# Patient Record
Sex: Male | Born: 1935 | Race: White | Hispanic: Yes | Marital: Married | Smoking: Never smoker
Health system: Southern US, Community
[De-identification: ages and names within clinical notes are randomized; demographics above are authoritative.]

## PROBLEM LIST (undated history)

## (undated) DIAGNOSIS — L719 Rosacea, unspecified: Secondary | ICD-10-CM

## (undated) DIAGNOSIS — E291 Testicular hypofunction: Secondary | ICD-10-CM

## (undated) DIAGNOSIS — R7303 Prediabetes: Secondary | ICD-10-CM

## (undated) DIAGNOSIS — M1990 Unspecified osteoarthritis, unspecified site: Secondary | ICD-10-CM

## (undated) DIAGNOSIS — I10 Essential (primary) hypertension: Secondary | ICD-10-CM

## (undated) DIAGNOSIS — E785 Hyperlipidemia, unspecified: Secondary | ICD-10-CM

## (undated) DIAGNOSIS — R001 Bradycardia, unspecified: Secondary | ICD-10-CM

## (undated) HISTORY — DX: Rosacea, unspecified: L71.9

## (undated) HISTORY — DX: Unspecified osteoarthritis, unspecified site: M19.90

## (undated) HISTORY — DX: Testicular hypofunction: E29.1

## (undated) HISTORY — DX: Prediabetes: R73.03

## (undated) HISTORY — DX: Essential (primary) hypertension: I10

## (undated) HISTORY — PX: INGUINAL HERNIA REPAIR: SUR1180

## (undated) HISTORY — PX: CATARACT EXTRACTION: SUR2

## (undated) HISTORY — DX: Bradycardia, unspecified: R00.1

## (undated) HISTORY — DX: Hyperlipidemia, unspecified: E78.5

---

## 2007-02-13 ENCOUNTER — Ambulatory Visit: Payer: Self-pay | Admitting: Internal Medicine

## 2007-02-13 DIAGNOSIS — I10 Essential (primary) hypertension: Secondary | ICD-10-CM

## 2007-02-13 DIAGNOSIS — E785 Hyperlipidemia, unspecified: Secondary | ICD-10-CM | POA: Insufficient documentation

## 2007-02-15 LAB — CONVERTED CEMR LAB
Basophils Relative: 1.8 % — ABNORMAL HIGH (ref 0.0–1.0)
Chloride: 107 meq/L (ref 96–112)
Creatinine, Ser: 0.8 mg/dL (ref 0.4–1.5)
GFR calc non Af Amer: 101 mL/min
HCT: 42.1 % (ref 39.0–52.0)
Hemoglobin: 14.5 g/dL (ref 13.0–17.0)
Lymphocytes Relative: 25.1 % (ref 12.0–46.0)
MCV: 90.6 fL (ref 78.0–100.0)
Neutrophils Relative %: 62.5 % (ref 43.0–77.0)
Platelets: 259 10*3/uL (ref 150–400)
RBC: 4.65 M/uL (ref 4.22–5.81)
RDW: 12.5 % (ref 11.5–14.6)

## 2007-03-27 ENCOUNTER — Encounter (INDEPENDENT_AMBULATORY_CARE_PROVIDER_SITE_OTHER): Payer: Self-pay | Admitting: *Deleted

## 2007-05-05 ENCOUNTER — Ambulatory Visit: Payer: Self-pay | Admitting: Internal Medicine

## 2007-05-08 ENCOUNTER — Encounter (INDEPENDENT_AMBULATORY_CARE_PROVIDER_SITE_OTHER): Payer: Self-pay | Admitting: *Deleted

## 2007-05-08 LAB — CONVERTED CEMR LAB
ALT: 28 units/L (ref 0–53)
CO2: 30 meq/L (ref 19–32)
Chloride: 104 meq/L (ref 96–112)
Cholesterol: 140 mg/dL (ref 0–200)
Creatinine, Ser: 1.1 mg/dL (ref 0.4–1.5)
GFR calc Af Amer: 85 mL/min
GFR calc non Af Amer: 70 mL/min
Glucose, Bld: 84 mg/dL (ref 70–99)
HDL: 33.6 mg/dL — ABNORMAL LOW (ref >39.0)
LDL Cholesterol: 67 mg/dL (ref 0–99)
Sodium: 142 meq/L (ref 135–145)
Total CHOL/HDL Ratio: 4.2
VLDL: 39 mg/dL (ref 0–40)

## 2007-05-10 ENCOUNTER — Telehealth (INDEPENDENT_AMBULATORY_CARE_PROVIDER_SITE_OTHER): Payer: Self-pay | Admitting: *Deleted

## 2007-05-19 ENCOUNTER — Ambulatory Visit: Payer: Self-pay | Admitting: Internal Medicine

## 2007-05-22 ENCOUNTER — Ambulatory Visit: Payer: Self-pay | Admitting: Internal Medicine

## 2007-05-24 ENCOUNTER — Ambulatory Visit: Payer: Self-pay | Admitting: Internal Medicine

## 2007-05-25 ENCOUNTER — Encounter: Payer: Self-pay | Admitting: Internal Medicine

## 2007-05-25 ENCOUNTER — Encounter (INDEPENDENT_AMBULATORY_CARE_PROVIDER_SITE_OTHER): Payer: Self-pay | Admitting: *Deleted

## 2007-05-25 LAB — CONVERTED CEMR LAB
LDL Cholesterol: 64 mg/dL (ref 0–99)
PSA: 0.6 ng/mL (ref 0.10–4.00)

## 2007-05-26 ENCOUNTER — Encounter: Payer: Self-pay | Admitting: Internal Medicine

## 2007-05-27 LAB — CONVERTED CEMR LAB

## 2007-06-01 ENCOUNTER — Ambulatory Visit: Payer: Self-pay | Admitting: Family Medicine

## 2007-06-01 ENCOUNTER — Encounter (INDEPENDENT_AMBULATORY_CARE_PROVIDER_SITE_OTHER): Payer: Self-pay | Admitting: *Deleted

## 2007-06-02 ENCOUNTER — Telehealth: Payer: Self-pay | Admitting: Internal Medicine

## 2007-06-07 ENCOUNTER — Telehealth (INDEPENDENT_AMBULATORY_CARE_PROVIDER_SITE_OTHER): Payer: Self-pay | Admitting: *Deleted

## 2007-06-08 ENCOUNTER — Ambulatory Visit: Payer: Self-pay | Admitting: Internal Medicine

## 2007-06-28 ENCOUNTER — Encounter: Admission: RE | Admit: 2007-06-28 | Discharge: 2007-06-28 | Payer: Self-pay | Admitting: Internal Medicine

## 2007-06-28 ENCOUNTER — Ambulatory Visit: Payer: Self-pay | Admitting: Internal Medicine

## 2007-08-02 ENCOUNTER — Telehealth (INDEPENDENT_AMBULATORY_CARE_PROVIDER_SITE_OTHER): Payer: Self-pay | Admitting: *Deleted

## 2007-08-04 ENCOUNTER — Emergency Department (HOSPITAL_COMMUNITY): Admission: EM | Admit: 2007-08-04 | Discharge: 2007-08-04 | Payer: Self-pay | Admitting: Emergency Medicine

## 2007-08-04 ENCOUNTER — Telehealth: Payer: Self-pay | Admitting: Internal Medicine

## 2007-08-04 ENCOUNTER — Ambulatory Visit (HOSPITAL_COMMUNITY): Admission: RE | Admit: 2007-08-04 | Discharge: 2007-08-04 | Payer: Self-pay | Admitting: Emergency Medicine

## 2007-10-05 ENCOUNTER — Ambulatory Visit: Payer: Self-pay | Admitting: Internal Medicine

## 2007-10-05 ENCOUNTER — Telehealth (INDEPENDENT_AMBULATORY_CARE_PROVIDER_SITE_OTHER): Payer: Self-pay | Admitting: *Deleted

## 2007-10-06 ENCOUNTER — Ambulatory Visit: Payer: Self-pay | Admitting: Internal Medicine

## 2007-10-09 ENCOUNTER — Telehealth (INDEPENDENT_AMBULATORY_CARE_PROVIDER_SITE_OTHER): Payer: Self-pay | Admitting: *Deleted

## 2007-10-09 LAB — CONVERTED CEMR LAB
Albumin: 3.9 g/dL (ref 3.5–5.2)
Alkaline Phosphatase: 50 units/L (ref 39–117)
BUN: 23 mg/dL (ref 6–23)
Creatinine, Ser: 1.5 mg/dL (ref 0.4–1.5)
GFR calc Af Amer: 59 mL/min
Potassium: 3.1 meq/L — ABNORMAL LOW (ref 3.5–5.1)
Sodium: 141 meq/L (ref 135–145)
Total Bilirubin: 0.8 mg/dL (ref 0.3–1.2)
Total Protein: 7.5 g/dL (ref 6.0–8.3)

## 2007-10-11 ENCOUNTER — Telehealth (INDEPENDENT_AMBULATORY_CARE_PROVIDER_SITE_OTHER): Payer: Self-pay | Admitting: *Deleted

## 2007-10-25 ENCOUNTER — Ambulatory Visit: Payer: Self-pay | Admitting: Internal Medicine

## 2007-11-01 LAB — CONVERTED CEMR LAB
BUN: 19 mg/dL (ref 6–23)
Chloride: 94 meq/L — ABNORMAL LOW (ref 96–112)
GFR calc Af Amer: 85 mL/min
GFR calc non Af Amer: 70 mL/min
Glucose, Bld: 111 mg/dL — ABNORMAL HIGH (ref 70–99)
Potassium: 3.3 meq/L — ABNORMAL LOW (ref 3.5–5.1)
Sodium: 123 meq/L — ABNORMAL LOW (ref 135–145)

## 2007-12-05 ENCOUNTER — Ambulatory Visit: Payer: Self-pay | Admitting: Internal Medicine

## 2007-12-07 ENCOUNTER — Encounter (INDEPENDENT_AMBULATORY_CARE_PROVIDER_SITE_OTHER): Payer: Self-pay | Admitting: *Deleted

## 2007-12-12 LAB — CONVERTED CEMR LAB
CO2: 27 meq/L (ref 19–32)
Chloride: 112 meq/L (ref 96–112)
GFR calc non Af Amer: 88 mL/min
Potassium: 4.2 meq/L (ref 3.5–5.1)

## 2007-12-15 ENCOUNTER — Ambulatory Visit: Payer: Self-pay | Admitting: Family Medicine

## 2007-12-15 ENCOUNTER — Encounter: Payer: Self-pay | Admitting: Internal Medicine

## 2008-01-01 ENCOUNTER — Telehealth (INDEPENDENT_AMBULATORY_CARE_PROVIDER_SITE_OTHER): Payer: Self-pay | Admitting: *Deleted

## 2008-02-27 ENCOUNTER — Telehealth (INDEPENDENT_AMBULATORY_CARE_PROVIDER_SITE_OTHER): Payer: Self-pay | Admitting: *Deleted

## 2008-02-27 ENCOUNTER — Encounter: Payer: Self-pay | Admitting: Internal Medicine

## 2008-03-12 ENCOUNTER — Ambulatory Visit: Payer: Self-pay | Admitting: Internal Medicine

## 2008-03-12 DIAGNOSIS — M810 Age-related osteoporosis without current pathological fracture: Secondary | ICD-10-CM

## 2008-03-29 LAB — CONVERTED CEMR LAB: Testosterone: 170.06 ng/dL — ABNORMAL LOW (ref 350–890)

## 2008-04-03 ENCOUNTER — Ambulatory Visit: Payer: Self-pay | Admitting: Internal Medicine

## 2008-05-01 ENCOUNTER — Telehealth: Payer: Self-pay | Admitting: Internal Medicine

## 2008-06-13 ENCOUNTER — Ambulatory Visit: Payer: Self-pay | Admitting: Internal Medicine

## 2008-06-13 DIAGNOSIS — E291 Testicular hypofunction: Secondary | ICD-10-CM

## 2008-06-24 LAB — CONVERTED CEMR LAB: Vit D, 1,25-Dihydroxy: 26 — ABNORMAL LOW (ref 30–89)

## 2008-06-25 LAB — CONVERTED CEMR LAB
BUN: 15 mg/dL (ref 6–23)
CO2: 25 meq/L (ref 19–32)
Calcium: 9.2 mg/dL (ref 8.4–10.5)
Cholesterol: 142 mg/dL (ref 0–200)
Creatinine, Ser: 0.9 mg/dL (ref 0.4–1.5)
GFR calc Af Amer: 106 mL/min
Glucose, Bld: 83 mg/dL (ref 70–99)
HDL: 23.7 mg/dL — ABNORMAL LOW (ref >39.0)
LH: 4.9 milliintl units/mL
PSA: 0.63 ng/mL (ref 0.10–4.00)
Testosterone: 195.48 ng/dL — ABNORMAL LOW (ref 350.00–890)
Triglycerides: 358 mg/dL (ref 0–149)

## 2008-07-16 ENCOUNTER — Encounter (INDEPENDENT_AMBULATORY_CARE_PROVIDER_SITE_OTHER): Payer: Self-pay | Admitting: *Deleted

## 2008-07-30 ENCOUNTER — Ambulatory Visit: Payer: Self-pay | Admitting: Internal Medicine

## 2008-08-22 ENCOUNTER — Encounter: Payer: Self-pay | Admitting: Internal Medicine

## 2008-09-11 ENCOUNTER — Ambulatory Visit: Payer: Self-pay | Admitting: Internal Medicine

## 2008-10-17 ENCOUNTER — Encounter: Payer: Self-pay | Admitting: Internal Medicine

## 2008-12-26 ENCOUNTER — Ambulatory Visit: Payer: Self-pay | Admitting: Internal Medicine

## 2009-01-02 LAB — CONVERTED CEMR LAB
ALT: 30 units/L (ref 0–53)
BUN: 18 mg/dL (ref 6–23)
Calcium: 9.3 mg/dL (ref 8.4–10.5)
Chloride: 106 meq/L (ref 96–112)
Cholesterol: 128 mg/dL (ref 0–200)
Creatinine, Ser: 1.1 mg/dL (ref 0.4–1.5)
Total CHOL/HDL Ratio: 5

## 2009-03-20 ENCOUNTER — Ambulatory Visit: Payer: Self-pay | Admitting: Internal Medicine

## 2009-05-22 ENCOUNTER — Encounter: Payer: Self-pay | Admitting: Internal Medicine

## 2009-06-06 ENCOUNTER — Ambulatory Visit: Payer: Self-pay | Admitting: Internal Medicine

## 2009-06-09 ENCOUNTER — Encounter (INDEPENDENT_AMBULATORY_CARE_PROVIDER_SITE_OTHER): Payer: Self-pay | Admitting: *Deleted

## 2009-06-09 LAB — CONVERTED CEMR LAB
BUN: 15 mg/dL (ref 6–23)
Basophils Relative: 1.2 % (ref 0.0–3.0)
CO2: 29 meq/L (ref 19–32)
Chloride: 103 meq/L (ref 96–112)
Eosinophils Relative: 4.9 % (ref 0.0–5.0)
HCT: 41.9 % (ref 39.0–52.0)
Lymphs Abs: 1.3 10*3/uL (ref 0.7–4.0)
MCV: 94.1 fL (ref 78.0–100.0)
Monocytes Absolute: 0.5 10*3/uL (ref 0.1–1.0)
Platelets: 274 10*3/uL (ref 150.0–400.0)
Potassium: 3.8 meq/L (ref 3.5–5.1)
RBC: 4.45 M/uL (ref 4.22–5.81)
WBC: 4.3 10*3/uL — ABNORMAL LOW (ref 4.5–10.5)

## 2009-06-11 ENCOUNTER — Telehealth (INDEPENDENT_AMBULATORY_CARE_PROVIDER_SITE_OTHER): Payer: Self-pay | Admitting: *Deleted

## 2009-08-22 ENCOUNTER — Telehealth (INDEPENDENT_AMBULATORY_CARE_PROVIDER_SITE_OTHER): Payer: Self-pay | Admitting: *Deleted

## 2009-08-24 ENCOUNTER — Encounter: Payer: Self-pay | Admitting: Internal Medicine

## 2009-08-25 ENCOUNTER — Ambulatory Visit: Payer: Self-pay | Admitting: Internal Medicine

## 2009-09-15 ENCOUNTER — Ambulatory Visit: Payer: Self-pay | Admitting: Internal Medicine

## 2009-09-18 ENCOUNTER — Telehealth (INDEPENDENT_AMBULATORY_CARE_PROVIDER_SITE_OTHER): Payer: Self-pay | Admitting: *Deleted

## 2009-09-19 ENCOUNTER — Emergency Department (HOSPITAL_COMMUNITY): Admission: EM | Admit: 2009-09-19 | Discharge: 2009-09-19 | Payer: Self-pay | Admitting: Emergency Medicine

## 2009-09-19 ENCOUNTER — Ambulatory Visit: Payer: Self-pay | Admitting: Internal Medicine

## 2009-09-30 ENCOUNTER — Ambulatory Visit: Payer: Self-pay | Admitting: Internal Medicine

## 2009-10-02 LAB — CONVERTED CEMR LAB
BUN: 16 mg/dL (ref 6–23)
CO2: 29 meq/L (ref 19–32)
Chloride: 106 meq/L (ref 96–112)
Creatinine, Ser: 1.1 mg/dL (ref 0.4–1.5)

## 2010-02-18 ENCOUNTER — Ambulatory Visit: Payer: Self-pay | Admitting: Internal Medicine

## 2010-03-04 ENCOUNTER — Encounter: Payer: Self-pay | Admitting: Internal Medicine

## 2010-03-04 ENCOUNTER — Ambulatory Visit: Payer: Self-pay | Admitting: Family Medicine

## 2010-03-09 LAB — CONVERTED CEMR LAB: Testosterone: 158.65 ng/dL — ABNORMAL LOW (ref 350.00–890.00)

## 2010-03-31 ENCOUNTER — Ambulatory Visit: Payer: Self-pay | Admitting: Internal Medicine

## 2010-04-02 ENCOUNTER — Telehealth: Payer: Self-pay | Admitting: Internal Medicine

## 2010-04-09 ENCOUNTER — Telehealth: Payer: Self-pay | Admitting: Internal Medicine

## 2010-05-18 ENCOUNTER — Ambulatory Visit: Payer: Self-pay | Admitting: Internal Medicine

## 2010-05-20 ENCOUNTER — Encounter: Payer: Self-pay | Admitting: Gastroenterology

## 2010-05-21 LAB — CONVERTED CEMR LAB
CO2: 26 meq/L (ref 19–32)
Chloride: 108 meq/L (ref 96–112)
Direct LDL: 50.2 mg/dL
Eosinophils Relative: 4.3 % (ref 0.0–5.0)
HDL: 23.1 mg/dL — ABNORMAL LOW (ref >39.00)
MCV: 92.4 fL (ref 78.0–100.0)
Monocytes Absolute: 0.5 10*3/uL (ref 0.1–1.0)
Neutrophils Relative %: 52.9 % (ref 43.0–77.0)
Platelets: 248 10*3/uL (ref 150.0–400.0)
Sodium: 141 meq/L (ref 135–145)
Total CHOL/HDL Ratio: 6
Triglycerides: 368 mg/dL — ABNORMAL HIGH (ref 0.0–149.0)
WBC: 5.3 10*3/uL (ref 4.5–10.5)

## 2010-06-03 ENCOUNTER — Encounter: Payer: Self-pay | Admitting: Gastroenterology

## 2010-06-24 ENCOUNTER — Ambulatory Visit: Payer: Self-pay | Admitting: Internal Medicine

## 2010-07-07 ENCOUNTER — Encounter (INDEPENDENT_AMBULATORY_CARE_PROVIDER_SITE_OTHER): Payer: Self-pay | Admitting: *Deleted

## 2010-07-08 ENCOUNTER — Ambulatory Visit: Payer: Self-pay | Admitting: Gastroenterology

## 2010-07-09 ENCOUNTER — Telehealth (INDEPENDENT_AMBULATORY_CARE_PROVIDER_SITE_OTHER): Payer: Self-pay | Admitting: *Deleted

## 2010-07-22 ENCOUNTER — Ambulatory Visit: Payer: Self-pay | Admitting: Gastroenterology

## 2010-09-02 NOTE — Assessment & Plan Note (Signed)
Summary: acute/cough,chest congestion,back pain/alr   Vital Signs:  Patient profile:   77 year old male Height:      66.5 inches Weight:      198 pounds BMI:     31.59 O2 Sat:      92 % Temp:     99.1 degrees F Pulse rate:   71 / minute BP sitting:   100 / 60  Vitals Entered By: Shary Decamp (September 19, 2009 1:12 PM) CC: still c/o of cough, congestion   History of Present Illness: sick since last night: N-V-D stools watery, several episodes ofvomiting, no appetite  daughter just recovering from similar symptoms   Current Medications (verified): 1)  Hydrochlorothiazide 25 Mg Tabs (Hydrochlorothiazide) .Marland Kitchen.. 1 By Mouth in Am 2)  Carvedilol 12.5 Mg  Tabs (Carvedilol) .Marland Kitchen.. 1 By Mouth  Two Times A Day 3)  Cozaar 100 Mg  Tabs (Losartan Potassium) .Marland Kitchen.. 1 By Mouth Once Daily Stop Taking Hyzaar 4)  Tricor 145 Mg  Tabs (Fenofibrate) .... Qd 5)  Simvastatin 20 Mg  Tabs (Simvastatin) .Marland Kitchen.. 1 By Mouth At Bedtime 6)  Meloxicam 15 Mg Tabs (Meloxicam) .Marland Kitchen.. 1 By Mouth Once Daily X 3 Weeks  Allergies (verified): 1)  ! Ace Inhibitors 2)  ! Norvasc (Amlodipine Besylate)  Past History:  Past Medical History: Reviewed history from 06/06/2009 and no changes required. Hyperlipidemia, triglycerides are high Hypertension Osteoporosis per DEXA  12-2007 hypogonadism, male  Past Surgical History: Reviewed history from 02/13/2007 and no changes required. Inguinal herniorrhaphy  Social History: Reviewed history from 08/25/2009 and no changes required. Mar[place] original from [place] Two children. Moved from [place] to [place] on early 2008. Tobacco use:  never Passive smoke exposure:  no Alcohol use:   wine - occasionally  Review of Systems       had fever this AM no hematemesis, no blood in  stools mild diffuse abdominal pain denies chest pain or shortness of breath, some cough no headache or sore throat  Physical Exam  General:  alert and well-developed.  looks weak,  has  a hard time ambulating Eyes:  no jaundice Lungs:  normal respiratory effort, no intercostal retractions, no accessory muscle use, and normal breath sounds.  No respiratory distress Heart:  normal rate, regular rhythm, and no murmur.   Abdomen:  soft, no distention, no masses, no guarding, and no rigidity.  mild diffuse tenderness to palpation   Impression & Recommendations:  Problem # 1:  GASTROENTERITIS (ICD-558.9) symptoms consistent with an acute gastroenteritis several people in the community with the same symptoms  BP is low, he near fainting the ALT is Plan:  ER evaluation hopefully he will feel better and will be able to go home after IV fluids  we will contact the ER with this information  His updated medication list for this problem includes:    Ondansetron Hcl 4 Mg Tabs (Ondansetron hcl) ..... One by mouth every 6 hours as  needed for nausea  Complete Medication List: 1)  Hydrochlorothiazide 25 Mg Tabs (Hydrochlorothiazide) .Marland Kitchen.. 1 by mouth in am 2)  Carvedilol 12.5 Mg Tabs (Carvedilol) .Marland Kitchen.. 1 by mouth  two times a day 3)  Cozaar 100 Mg Tabs (Losartan potassium) .Marland Kitchen.. 1 by mouth once daily stop taking hyzaar 4)  Tricor 145 Mg Tabs (Fenofibrate) .... Qd 5)  Simvastatin 20 Mg Tabs (Simvastatin) .Marland Kitchen.. 1 by mouth at bedtime 6)  Meloxicam 15 Mg Tabs (Meloxicam) .Marland Kitchen.. 1 by mouth once daily x 3 weeks 7)  Ondansetron Hcl 4 Mg Tabs (Ondansetron hcl) .... One by mouth every 6 hours as  needed for nausea  Patient Instructions: 1)    Prescriptions: ONDANSETRON HCL 4 MG TABS (ONDANSETRON HCL) one by mouth every 6 hours as  needed for nausea  #16 x 0   Entered and Authorized by:   Nolon Rod. Kendricks Reap MD   Signed by:   Nolon Rod. Maki Sweetser MD on 09/19/2009   Method used:   Print then Give to Patient   RxID:   [id number]   Appended Document: acute/cough,chest congestion,back pain/alr faxed to [facility] ED [phone]

## 2010-09-02 NOTE — Assessment & Plan Note (Signed)
Summary: rto 3 months/cbs   Vital Signs:  Patient profile:   77 year old male Weight:      202.25 pounds Pulse rate:   56 / minute Pulse rhythm:   regular BP sitting:   128 / 70  (left arm) Cuff size:   large  Vitals Entered By: Army Fossa CMA (February 18, 2010 1:34 PM) CC: Pt here for 3 month f/u: not fasting   History of Present Illness: Hyperlipidemia, good medication compliance  Hypertension-- good medication compliance, ambulatory BPs 120/70s Osteoporosis --  on no meds, use to take fosamax  hypogonadism,  has not seen Dr Talmage Nap, hard for him to go overthere. He was Rx testosterone ; while on HRT  he felt better   Current Medications (verified): 1)  Hydrochlorothiazide 25 Mg Tabs (Hydrochlorothiazide) .Marland Kitchen.. 1 By Mouth in Am 2)  Carvedilol 12.5 Mg  Tabs (Carvedilol) .Marland Kitchen.. 1 By Mouth  Two Times A Day 3)  Cozaar 100 Mg  Tabs (Losartan Potassium) .Marland Kitchen.. 1 By Mouth Once Daily Stop Taking Hyzaar 4)  Tricor 145 Mg  Tabs (Fenofibrate) .... Qd 5)  Simvastatin 20 Mg  Tabs (Simvastatin) .Marland Kitchen.. 1 By Mouth At Bedtime 6)  Meloxicam 15 Mg Tabs (Meloxicam) .Marland Kitchen.. 1 By Mouth Once Daily X 3 Weeks  Allergies: 1)  ! Ace Inhibitors 2)  ! Norvasc (Amlodipine Besylate)  Past History:  Past Medical History: Reviewed history from 06/06/2009 and no changes required. Hyperlipidemia, triglycerides are high Hypertension Osteoporosis per DEXA  12-2007 hypogonadism, male  Past Surgical History: Reviewed history from 02/13/2007 and no changes required. Inguinal herniorrhaphy  Social History: Reviewed history from 08/25/2009 and no changes required. Married original from [place] Two children. Moved from [place] to [place] on early 2008. Tobacco use:  never Passive smoke exposure:  no Alcohol use:   wine - occasionally  Review of Systems CV:  Denies chest pain or discomfort and swelling of feet. Resp:  Denies cough and shortness of breath. GU:  Denies dysuria, hematuria, urinary  frequency, and urinary hesitancy.  Physical Exam  General:  alert and well-developed.   Lungs:  normal respiratory effort, no intercostal retractions, no accessory muscle use, and normal breath sounds.  No respiratory distress Heart:  normal rate, regular rhythm, and no murmur.   Abdomen:  soft, non-tender, no distention, no masses, no guarding, and no rigidity.   Rectal:  No external abnormalities noted. Normal sphincter tone. No rectal masses or tenderness. Brown stools , Hemoccult negative Prostate:  Prostate gland firm and smooth, no enlargement, nodularity, tenderness, mass, asymmetry or induration. Extremities:  no edema   Impression & Recommendations:  Problem # 1:  OSTEOPOROSIS (ICD-733.00) diagnosed based on a bone density test 12/2007 He also had low vitamin D, status post ergocalciferol He took Fosamax for a while but then it was discontinued. No side effects Plan: Recheck a bone density test Recheck vitamin D Consider prescribe again Fosamax Treat hypogonadism  Orders: T-Vitamin D (25-Hydroxy) ([address]) Radiology Referral (Radiology)  Problem # 2:  HYPOGONADISM, MALE (ICD-257.2) diagnosed with hypogonadism in 2008 Was referred to endocrinology  in 2009 He temporarily took  testosterone without side effects, in fact he felt great He lost followup to  endocrinology a while  back Plan:  Recheck lab Reconsider H RT DRE and PSA  try to obtain records from endocrinology  Orders: TLB-Testosterone, Total (84403-TESTO)  Problem # 3:  HYPERTENSION (ICD-401.9) at goal His updated medication list for this problem includes:    Hydrochlorothiazide 25  Mg Tabs (Hydrochlorothiazide) .Marland Kitchen... 1 by mouth in am    Carvedilol 12.5 Mg Tabs (Carvedilol) .Marland Kitchen... 1 by mouth  two times a day    Cozaar 100 Mg Tabs (Losartan potassium) .Marland Kitchen... 1 by mouth once daily stop taking hyzaar  BP today: 128/70 Prior BP: 130/80 (09/30/2009)  Labs Reviewed: K+: 3.5 (09/30/2009) Creat: : 1.1  (09/30/2009)   Chol: 128 (12/26/2008)   HDL: 25.80 (12/26/2008)   LDL: DEL (06/13/2008)   TG: 211.0 (12/26/2008)  Problem # 4:  HYPERLIPIDEMIA (ICD-272.4) good medication compliance His updated medication list for this problem includes:    Tricor 145 Mg Tabs (Fenofibrate) ..... Qd    Simvastatin 20 Mg Tabs (Simvastatin) .Marland Kitchen... 1 by mouth at bedtime  Labs Reviewed: SGOT: 37 (12/26/2008)   SGPT: 30 (12/26/2008)   HDL:25.80 (12/26/2008), 23.7 (06/13/2008)  LDL:DEL (06/13/2008), 64 (98/06/9146)  Chol:128 (12/26/2008), 142 (06/13/2008)  Trig:211.0 (12/26/2008), 358 (06/13/2008)  Orders: Venipuncture (82956) TLB-ALT (SGPT) (84460-ALT) TLB-AST (SGOT) (84450-SGOT) Specimen Handling (21308)  Problem # 5:  HEALTH SCREENING (ICD-V70.0) due for a physical exam, see instructions Doing a prostate cancer screening today because we may need to restart his HRT  Complete Medication List: 1)  Hydrochlorothiazide 25 Mg Tabs (Hydrochlorothiazide) .Marland Kitchen.. 1 by mouth in am 2)  Carvedilol 12.5 Mg Tabs (Carvedilol) .Marland Kitchen.. 1 by mouth  two times a day 3)  Cozaar 100 Mg Tabs (Losartan potassium) .Marland Kitchen.. 1 by mouth once daily stop taking hyzaar 4)  Tricor 145 Mg Tabs (Fenofibrate) .... Qd 5)  Simvastatin 20 Mg Tabs (Simvastatin) .Marland Kitchen.. 1 by mouth at bedtime 6)  Meloxicam 15 Mg Tabs (Meloxicam) .Marland Kitchen.. 1 by mouth once daily x 3 weeks  Other Orders: TLB-PSA (Prostate Specific Antigen) (84153-PSA)  Patient Instructions: 1)  come back fasting in 2 months, physical exam Prescriptions: MELOXICAM 15 MG TABS (MELOXICAM) 1 by mouth once daily x 3 weeks  #30 x 6   Entered and Authorized by:   Elita Quick E. Pacey Altizer MD   Signed by:   Nolon Rod. Faithanne Verret MD on 02/18/2010   Method used:   Print then Give to Patient   RxID:   [id number]

## 2010-09-02 NOTE — Assessment & Plan Note (Signed)
Summary: for congestion//ph   Vital Signs:  Patient profile:   77 year old male Height:      66.6 inches Weight:      203.38 pounds BMI:     32.35 O2 Sat:      93 % on Room air Temp:     98.2 degrees F oral Pulse rate:   57 / minute Pulse rhythm:   regular BP sitting:   128 / 86  (left arm) Cuff size:   large  Vitals Entered By: Army Fossa CMA (June 24, 2010 2:40 PM)  O2 Flow:  Room air CC: Pt here states that he has head congestion, pressure around eyes Comments Pharm- [facility]    History of Present Illness: sudden onset both moderate nasal congestion last night Some sore throat Better today  Current Medications (verified): 1)  Hydrochlorothiazide 25 Mg Tabs (Hydrochlorothiazide) .Marland Kitchen.. 1 By Mouth in Am 2)  Carvedilol 12.5 Mg  Tabs (Carvedilol) .Marland Kitchen.. 1 By Mouth  Two Times A Day 3)  Cozaar 100 Mg  Tabs (Losartan Potassium) .Marland Kitchen.. 1 By Mouth Once Daily Stop Taking Hyzaar 4)  Tricor 145 Mg  Tabs (Fenofibrate) .... Qd 5)  Simvastatin 20 Mg  Tabs (Simvastatin) .Marland Kitchen.. 1 By Mouth At Bedtime 6)  Androgel Pump 1 % Gel (Testosterone) .... 2 Pumps  Daily 7)  Actonel 150 Mg Tabs (Risedronate Sodium) .... One By Mouth Monthly  Allergies (verified): 1)  ! Ace Inhibitors 2)  ! Norvasc (Amlodipine Besylate)  Past History:  Past Medical History: Reviewed history from 05/18/2010 and no changes required. Hyperlipidemia, triglycerides are high Hypertension Osteoporosis per DEXA  12-2007 hypogonadism, male cataract B 2010  Past Surgical History: Reviewed history from 02/13/2007 and no changes required. Inguinal herniorrhaphy  Review of Systems General:  subjective F  yesterday. Resp:  mild cough no sputum. GI:  Denies nausea and vomiting. MS:  Denies muscle aches.  Physical Exam  General:  alert and well-developed.  nontoxic Head:  face symmetric Ears:  abundant cerumen bilaterally, unable to remove it w/ a spoon Nose:   slightly congested Mouth:  no redness or  discharge Lungs:  normal respiratory effort, no intercostal retractions, no accessory muscle use, and normal breath sounds.   Heart:  normal rate, regular rhythm, no murmur, and no gallop.     Impression & Recommendations:  Problem # 1:  URI (ICD-465.9) see  instructions The following medications were removed from the medication list:    Meloxicam 15 Mg Tabs (Meloxicam) .Marland Kitchen... 1 by mouth once daily x 3 weeks  Problem # 2:  cerumen impactation recommend to use peroxide as needed  Complete Medication List: 1)  Hydrochlorothiazide 25 Mg Tabs (Hydrochlorothiazide) .Marland Kitchen.. 1 by mouth in am 2)  Carvedilol 12.5 Mg Tabs (Carvedilol) .Marland Kitchen.. 1 by mouth  two times a day 3)  Cozaar 100 Mg Tabs (Losartan potassium) .Marland Kitchen.. 1 by mouth once daily stop taking hyzaar 4)  Tricor 145 Mg Tabs (Fenofibrate) .... Qd 5)  Simvastatin 20 Mg Tabs (Simvastatin) .Marland Kitchen.. 1 by mouth at bedtime 6)  Androgel Pump 1 % Gel (Testosterone) .... 2 pumps  daily 7)  Actonel 150 Mg Tabs (Risedronate sodium) .... One by mouth monthly  Patient Instructions: 1)  rest 2)  fluids 3)  tylenol 4)  robitussin DM as needed if cough 5)  astepro samples: 2 sprays on each side of the nose two times a day until samples gone  6)  call if no better in few days  7)  peroxide drops to ears 3 times a week    Orders Added: 1)  Est. Patient Level III [[address]]

## 2010-09-02 NOTE — Letter (Signed)
Summary: Pre Visit Letter Revised  [facility]  [address] [place], [place] [address]   Phone: [phone]  Fax: [phone]        05/20/2010 MRN: [id number]  [facility] [place] [address] [place], [place]  [address]             Procedure Date: 12-21 at 10:30am   Welcome to the Gastroenterology Division at [facility].    You are scheduled to see a nurse for your pre-procedure visit on 07-08-10 at 1pm on the 3rd floor at [facility], [address]  We ask that you try to arrive at our office 15 minutes prior to your appointment time to allow for check-in.  Please take a minute to review the attached form.  If you answer "Yes" to one or more of the questions on the first page, we ask that you call the person listed at your earliest opportunity.  If you answer "No" to all of the questions, please complete the rest of the form and bring it to your appointment.    Your nurse visit will consist of discussing your medical and surgical history, your immediate family medical history, and your medications.   If you are unable to list all of your medications on the form, please bring the medication bottles to your appointment and we will list them.  We will need to be aware of both prescribed and over the counter drugs.  We will need to know exact dosage information as well.    Please be prepared to read and sign documents such as consent forms, a financial agreement, and acknowledgement forms.  If necessary, and with your consent, a friend or relative is welcome to sit-in on the nurse visit with you.  Please bring your insurance card so that we may make a copy of it.  If your insurance requires a referral to see a specialist, please bring your referral form from your primary care physician.  No co-pay is required for this nurse visit.     If you cannot keep your appointment, please call [phone] to cancel or reschedule prior to your appointment date.  This  allows [place] the opportunity to schedule an appointment for another patient in need of care.    Thank you for choosing [facility] for your medical needs.  We appreciate the opportunity to care for you.  Please visit [place] at our website  to learn more about our practice.  Sincerely, The Gastroenterology Division

## 2010-09-02 NOTE — Assessment & Plan Note (Signed)
Summary: review BMD results//lch   Vital Signs:  Patient profile:   77 year old male Weight:      200 pounds Pulse rate:   51 / minute Pulse rhythm:   regular BP sitting:   124 / 82  (left arm) Cuff size:   large  Vitals Entered By: Army Fossa CMA (March 31, 2010 10:17 AM) CC: Here to discuss BMD.    History of Present Illness: to  discusse  the results of his bone density test which show osteoporosis    Allergies: 1)  ! Ace Inhibitors 2)  ! Norvasc (Amlodipine Besylate)  Past History:  Past Medical History: Reviewed history from 06/06/2009 and no changes required. Hyperlipidemia, triglycerides are high Hypertension Osteoporosis per DEXA  12-2007 hypogonadism, male  Past Surgical History: Reviewed history from 02/13/2007 and no changes required. Inguinal herniorrhaphy  Social History: Married original from [place] Two children. Moved from [place] to [place] on early 2008. 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   Psych:  Oriented X3, memory intact for recent and remote, normally interactive, good eye contact, not anxious appearing, and not depressed appearing.     Impression & Recommendations:  Problem # 1:  OSTEOPOROSIS (ICD-733.00)  I discussed with the patient the results of his bone density test, explained to him the risk of fractures Plan: Start treatment with biphosphonates. I think Actonel is a good first step if cost is not a problem Continue with calcium and vitamin D Continue to be active treat hypogonadism  His updated medication list for this problem includes:    Actonel 150 Mg Tabs (Risedronate sodium) ..... One by mouth monthly  Problem # 2:  HYPOGONADISM, MALE  (ICD-257.2) records from endocrinology reviewed, he was diagnosed with primary hypogonadism on January 2010, prescribed testosterone. PSA was normal last month Plan: Start AndroGel , recheck testosterone next month when he comes back for his physical  Complete Medication List: 1)  Hydrochlorothiazide 25 Mg Tabs (Hydrochlorothiazide) .Marland Kitchen.. 1 by mouth in am 2)  Carvedilol 12.5 Mg Tabs (Carvedilol) .Marland Kitchen.. 1 by mouth  two times a day 3)  Cozaar 100 Mg Tabs (Losartan potassium) .Marland Kitchen.. 1 by mouth once daily stop taking hyzaar 4)  Tricor 145 Mg Tabs (Fenofibrate) .... Qd 5)  Simvastatin 20 Mg Tabs (Simvastatin) .Marland Kitchen.. 1 by mouth at bedtime 6)  Meloxicam 15 Mg Tabs (Meloxicam) .Marland Kitchen.. 1 by mouth once daily x 3 weeks 7)  Androgel Pump 1 % Gel (Testosterone) .... 2 pumps  daily 8)  Actonel 150 Mg Tabs (Risedronate sodium) .... One by mouth monthly  Other Orders: Flu Vaccine 87yrs + (60454) Administration Flu vaccine - MCR (U9811)  Patient Instructions: 1)  AndroGel 2  pumps every day 2)  Actonel once a month. You need to take it first thing in the morning before breakfast; after you take it, remained seated and don't eat for at least 30 minutes.Call  immediately if you have chest pain or difficulty swallowing 3)  came back in one month for your physical exam, fasting Flu Vaccine Consent Questions     Do you have a history of severe allergic reactions to this vaccine? no    Any prior  history of allergic reactions to egg and/or gelatin? no    Do you have a sensitivity to the preservative Thimersol? no    Do you have a past history of Guillan-Barre Syndrome? no    Do you currently have an acute febrile illness? no    Have you ever had a severe reaction to latex? no    Vaccine information given and explained to patient? yes    Are you currently pregnant? no    Lot Number:[id number]   Exp Date:01/30/2011   Site Given  Left Deltoid IMPrescriptions: ACTONEL 150 MG TABS (RISEDRONATE SODIUM) one by mouth monthly   #1 x 12   Entered and Authorized by:   Jose E. Paz MD   Signed by:   Nolon Rod. Paz MD on 03/31/2010   Method used:   Print then Give to Patient   RxID:   [id number] ANDROGEL PUMP 1 % GEL (TESTOSTERONE) 2 pumps  daily  #1 x 0   Entered and Authorized by:   Nolon Rod. Paz MD   Signed by:   Nolon Rod. Paz MD on 03/31/2010   Method used:   Print then Give to Patient   RxID:   [id number]    .[email or web address]

## 2010-09-02 NOTE — Progress Notes (Signed)
Summary: REFILL REQUEST  Phone Note Refill Request Message from:  Pharmacy on April 09, 2010 12:14 PM  Refills Requested: Medication #1:  MELOXICAM 15 MG TABS 1 by mouth once daily x 3 weeks   Dosage confirmed as above?Dosage Confirmed   Supply Requested: 1 month [facility] [address]   Next Appointment Scheduled: NONE Initial call taken by: Lavell Islam,  April 09, 2010 12:15 PM  Follow-up for Phone Call        ok 30 and 6 RF Follow-up by: [facility] E. Mylo Choi MD,  April 10, 2010 9:51 AM    Prescriptions: MELOXICAM 15 MG TABS (MELOXICAM) 1 by mouth once daily x 3 weeks  #30 x 6   Entered by:   Army Fossa CMA   Authorized by:   Nolon Rod. Lauris Keepers MD   Signed by:   Army Fossa CMA on 04/10/2010   Method used:   Electronically to        [facility]* (retail)       [address] [place], [place]  [address]       Ph: [phone]       Fax: [phone]   RxID:   [id number]

## 2010-09-02 NOTE — Progress Notes (Signed)
  Phone Note Call from Patient Call back at [phone]   Caller: Daughter Summary of Call: pt c/o dry cough,congestion, offered appt today with another pcp, pt refused nly wants to see Dr Drue Novel, ov scheduled for monday, recommend if signs increase or worsen over weekend ED, daughter agreed .Kandice Hams  August 22, 2009 11:32 AM  Initial call taken by: Kandice Hams,  August 22, 2009 11:32 AM

## 2010-09-02 NOTE — Letter (Signed)
Summary: [facility] Instructions  [facility]  [address] [place], [place] [address]   Phone: [phone]  Fax: [phone]       Maurice Colon    09/27/1932    MRN: [id number]        Procedure Day Dorna Bloom:  [facility] 07/22/10     Arrival Time: 9:30am     Procedure Time: 10:30am     Location of Procedure:                    Juliann Pares  [facility] (4th Floor)                       PREPARATION FOR COLONOSCOPY WITH MOVIPREP   Starting 5 days prior to your procedure  FRIDAY 12/16  do not eat nuts, seeds, popcorn, corn, beans, peas,  salads, or any raw vegetables.  Do not take any fiber supplements (e.g. Metamucil, Citrucel, and Benefiber).  THE DAY BEFORE YOUR PROCEDURE         DATE:  TUESDAY  12/20  1.  Drink clear liquids the entire day-NO SOLID FOOD  2.  Do not drink anything colored red or purple.  Avoid juices with pulp.  No orange juice.  3.  Drink at least 64 oz. (8 glasses) of fluid/clear liquids during the day to prevent dehydration and help the prep work efficiently.  CLEAR LIQUIDS INCLUDE: Water Jello Ice Popsicles Tea (sugar ok, no milk/cream) Powdered fruit flavored drinks Coffee (sugar ok, no milk/cream) Gatorade Juice: apple, white grape, white cranberry  Lemonade Clear bullion, consomm, broth Carbonated beverages (any kind) Strained chicken noodle soup Hard Candy                             4.  In the morning, mix first dose of MoviPrep solution:    Empty 1 Pouch A and 1 Pouch B into the disposable container    Add lukewarm drinking water to the top line of the container. Mix to dissolve    Refrigerate (mixed solution should be used within 24 hrs)  5.  Begin drinking the prep at 5:00 p.m. The MoviPrep container is divided by 4 marks.   Every 15 minutes drink the solution down to the next mark (approximately 8 oz) until the full liter is complete.   6.  Follow completed prep with 16 oz of clear liquid of your choice  (Nothing red or purple).  Continue to drink clear liquids until bedtime.  7.  Before going to bed, mix second dose of MoviPrep solution:    Empty 1 Pouch A and 1 Pouch B into the disposable container    Add lukewarm drinking water to the top line of the container. Mix to dissolve    Refrigerate  THE DAY OF YOUR PROCEDURE      DATE: [facility]  12/21  Beginning at  5:30 a.m. (5 hours before procedure):         1. Every 15 minutes, drink the solution down to the next mark (approx 8 oz) until the full liter is complete.  2. Follow completed prep with 16 oz. of clear liquid of your choice.    3. You may drink clear liquids until  8:30am  (2 HOURS BEFORE PROCEDURE).   MEDICATION INSTRUCTIONS  Unless otherwise instructed, you should take regular prescription medications with a small sip of water   as early as possible  the morning of your procedure.  Be sure to take your Losartan and Carvedilol the morning of procedure.  Additional medication instructions: Do not take Hydrochlorothiazide the morning of procedure.         OTHER INSTRUCTIONS  You will need a responsible adult at least 75 years of age to accompany you and drive you home.   This person must remain in the waiting room during your procedure.  Wear loose fitting clothing that is easily removed.  Leave jewelry and other valuables at home.  However, you may wish to bring a book to read or  an iPod/MP3 player to listen to music as you wait for your procedure to start.  Remove all body piercing jewelry and leave at home.  Total time from sign-in until discharge is approximately 2-3 hours.  You should go home directly after your procedure and rest.  You can resume normal activities the  day after your procedure.  The day of your procedure you should not:   Drive   Make legal decisions   Operate machinery   Drink alcohol   Return to work  You will receive specific instructions about eating, activities and  medications before you leave.    The above instructions have been reviewed and explained to me by   Wyona Almas RN  July 08, 2010 2:49 PM     I fully understand and can verbalize these instructions _____________________________ Date _________

## 2010-09-02 NOTE — Progress Notes (Signed)
Summary: Phone-appt  Phone Note Call from Patient Call back at Home Phone [phone]   Caller: Daughter Summary of Call: Patient daughtert Regan Rakers) called  requesting an appt  for her [facility]  tommorrown on Feb 18,2011. Patient is coughing alot and chest hurts and it been going on 3 weeks. patient does not want to see another Doctor. Please advise. Initial call taken by: Barb Merino,  September 18, 2009 11:30 AM  Follow-up for Phone Call        spoke with pt daughter ov scheduled tomrrow pm working in .Kandice Hams  September 18, 2009 11:58 AM  Follow-up by: Kandice Hams,  September 18, 2009 11:58 AM

## 2010-09-02 NOTE — Miscellaneous (Signed)
Summary: BONE DENSITY  Clinical Lists Changes  Orders: Added new Test order of T-Bone Densitometry (77080) - Signed Added new Test order of T-Lumbar Vertebral Assessment (77082) - Signed 

## 2010-09-02 NOTE — Assessment & Plan Note (Signed)
Summary: dry cough,congestion/alr   Vital Signs:  Patient profile:   77 year old male Height:      66.5 inches Weight:      199.4 pounds BMI:     31.82 O2 Sat:      97 % on Room air Temp:     100 degrees F oral Pulse rate:   57 / minute Pulse rhythm:   regular BP sitting:   132 / 70  (left arm) Cuff size:   large  Vitals Entered By: Shary Decamp (August 25, 2009 8:49 AM)  O2 Flow:  Room air CC: acute only Is Patient Diabetic? No Comments  - cough x 5-6 days  - [facility], was dx with [facility], +CXR  - on Levaquin 750 x 5 day Shary Decamp  August 25, 2009 8:55 AM    History of Present Illness: as above reports no fever until  today  Current Medications (verified): 1)  Hydrochlorothiazide 25 Mg Tabs (Hydrochlorothiazide) .Marland Kitchen.. 1 By Mouth in Am 2)  Carvedilol 12.5 Mg  Tabs (Carvedilol) .Marland Kitchen.. 1 By Mouth  Two Times A Day 3)  Cozaar 100 Mg  Tabs (Losartan Potassium) .Marland Kitchen.. 1 By Mouth Once Daily Stop Taking Hyzaar 4)  Tricor 145 Mg  Tabs (Fenofibrate) .... Qd 5)  Simvastatin 20 Mg  Tabs (Simvastatin) .Marland Kitchen.. 1 By Mouth At Bedtime 6)  Meloxicam 15 Mg Tabs (Meloxicam) .Marland Kitchen.. 1 By Mouth Once Daily X 3 Weeks 7)  Levaquin 750 Mg Tabs (Levofloxacin) .Marland Kitchen.. 1 By Mouth Once Daily X 5 Days  Allergies (verified): 1)  ! Ace Inhibitors 2)  ! Norvasc (Amlodipine Besylate)  Past History:  Past Medical History: Reviewed history from 06/06/2009 and no changes required. Hyperlipidemia, triglycerides are high Hypertension Osteoporosis per DEXA  12-2007 hypogonadism, male  Past Surgical History: Reviewed history from 02/13/2007 and no changes required. Inguinal herniorrhaphy  Social History: Married original from [place] Two children. Moved from [place] to [place] on early 2008. Tobacco use:  never Passive smoke exposure:  no Alcohol use:   wine - occasionally  Review of Systems       denies nausea, vomiting, diarrhea no shortness of breath occ. sputum,  yellow in color.  No hemoptysis unable to sleep due to cough  Physical Exam  General:  alert, well-developed, and well-nourished.   Nose:  slightly congested Lungs:  normal respiratory effort, no intercostal retractions, no accessory muscle use, and normal breath sounds.  No respiratory distress Heart:  normal rate, regular rhythm, and no murmur.   Extremities:  no edema   Impression & Recommendations:  Problem # 1:  PNEUMONIA, COMMUNITY ACQUIRED, PNEUMOCOCCAL (ICD-481) will get records from urgent care see instructions His updated medication list for this problem includes:    Levaquin 750 Mg Tabs (Levofloxacin) .Marland Kitchen... 1 by mouth once daily x 5 days  Orders: T-2 View CXR (71020TC)  Complete Medication List: 1)  Hydrochlorothiazide 25 Mg Tabs (Hydrochlorothiazide) .Marland Kitchen.. 1 by mouth in am 2)  Carvedilol 12.5 Mg Tabs (Carvedilol) .Marland Kitchen.. 1 by mouth  two times a day 3)  Cozaar 100 Mg Tabs (Losartan potassium) .Marland Kitchen.. 1 by mouth once daily stop taking hyzaar 4)  Tricor 145 Mg Tabs (Fenofibrate) .... Qd 5)  Simvastatin 20 Mg Tabs (Simvastatin) .Marland Kitchen.. 1 by mouth at bedtime 6)  Meloxicam 15 Mg Tabs (Meloxicam) .Marland Kitchen.. 1 by mouth once daily x 3 weeks 7)  Levaquin 750 Mg Tabs (Levofloxacin) .Marland Kitchen.. 1 by mouth once daily x 5 days 8)  Guaifenesin Ac 100-10 Mg/55ml Syrp (Guaifenesin-codeine) .Marland Kitchen.. 1 or 2 tsp by mouth every 8 hours as neded for cough  Patient Instructions: 1)  rest, fluids, Tylenol 2)  mucinex DM OTC as needed for cough 3)  if the cough continue you may also use codeine 4)  call immediately if you feel a lot worse or to have sure breath 5)  get a chest x-ray in 3 weeks Prescriptions: GUAIFENESIN AC 100-10 MG/5ML SYRP (GUAIFENESIN-CODEINE) 1 or 2 tsp by mouth every 8 hours as neded for cough  #200cc x 0   Entered and Authorized by:   Nolon Rod. Ingrid Shifrin MD   Signed by:   Nolon Rod. Chandler Stofer MD on 08/25/2009   Method used:   Print then Give to Patient   RxID:   [id number]

## 2010-09-02 NOTE — Letter (Signed)
Summary: Pre Visit Letter Revised  [facility]  [address] [place], [place] [address]   Phone: [phone]  Fax: [phone]        06/03/2010 MRN: [id number]  [facility] NIEVES [address] [place], [place]  [address]             Procedure Date:  12-21 at 10:30am           Dr Danne Harbor to the Gastroenterology Division at [facility].    You are scheduled to see a nurse for your pre-procedure visit on 07-08-10 at  2 pm on the 3rd floor at [facility], [address]  We ask that you try to arrive at our office 15 minutes prior to your appointment time to allow for check-in.  Please take a minute to review the attached form.  If you answer "Yes" to one or more of the questions on the first page, we ask that you call the person listed at your earliest opportunity.  If you answer "No" to all of the questions, please complete the rest of the form and bring it to your appointment.    Your nurse visit will consist of discussing your medical and surgical history, your immediate family medical history, and your medications.   If you are unable to list all of your medications on the form, please bring the medication bottles to your appointment and we will list them.  We will need to be aware of both prescribed and over the counter drugs.  We will need to know exact dosage information as well.    Please be prepared to read and sign documents such as consent forms, a financial agreement, and acknowledgement forms.  If necessary, and with your consent, a friend or relative is welcome to sit-in on the nurse visit with you.  Please bring your insurance card so that we may make a copy of it.  If your insurance requires a referral to see a specialist, please bring your referral form from your primary care physician.  No co-pay is required for this nurse visit.     If you cannot keep your appointment, please call [phone] to cancel or reschedule prior to your  appointment date.  This allows [place] the opportunity to schedule an appointment for another patient in need of care.    Thank you for choosing  Gastroenterology for your medical needs.  We appreciate the opportunity to care for you.  Please visit [place] at our website  to learn more about our practice.  Sincerely, The Gastroenterology Division

## 2010-09-02 NOTE — Miscellaneous (Signed)
Summary: lec pREVISIT/PREP  Clinical Lists Changes  Medications: Added new medication of MOVIPREP 100 GM  SOLR (PEG-KCL-NACL-NASULF-NA ASC-C) As per prep instructions. - Signed Rx of MOVIPREP 100 GM  SOLR (PEG-KCL-NACL-NASULF-NA ASC-C) As per prep instructions.;  #1 x 0;  Signed;  Entered by: Wyona Almas RN;  Authorized by: Rachael Fee MD;  Method used: Electronically to [facility] [address] [place], [place]  [address], Ph: [phone], Fax: [phone] Observations: Added new observation of ALLERGY REV: Done (07/08/2010 14:00)    Prescriptions: MOVIPREP 100 GM  SOLR (PEG-KCL-NACL-NASULF-NA ASC-C) As per prep instructions.  #1 x 0   Entered by:   Wyona Almas RN   Authorized by:   Rachael Fee MD   Signed by:   Wyona Almas RN on 07/08/2010   Method used:   Electronically to        [facility]* (retail)       [address] [place], [place]  [address]       Ph: [phone]       Fax: [phone]   RxID:   [id number]

## 2010-09-02 NOTE — Assessment & Plan Note (Signed)
Summary: rto 1 month/cbs   Vital Signs:  Patient profile:   77 year old male Weight:      204.13 pounds Pulse rate:   56 / minute Pulse rhythm:   regular BP sitting:   126 / 86  (left arm) Cuff size:   large  Vitals Entered By: Army Fossa CMA (May 18, 2010 8:56 AM) CC: 1 month f/u- Fasting Comments [facility] [address]    History of Present Illness: Here for Medicare AWV: 1.   Risk factors based on Past M, S, F history: reviewed   2.   Physical Activities: has a treadmil, uses it sometimes, patient is active  3.   Depression/mood: denies , no problems noted  4.   Hearing: denies problems , no problems noted  5.   ADL's: totally independent  6.   Fall Risk: low risk, no recent falls  7.   Home Safety: feels safe at home  8.   Height, weight, &visual acuity: see VS, s/p cataract surgery B w/ good results  9.   Counseling: yes, see below  10.   Labs ordered based on risk factors: yes  11.           Referral Coordination, if needed  12.           Care Plan, see a/p  13.            Cognitive Assessment: motor  skills, memory, cognition seems appropriate  in addition, we discussed the following issues Hypertension--  ambulatory BPs < 150/89, rarely that high  Osteoporosis -- good medication compliance w/  actonel , follows recommendations and precautions on how to take Actonel, no apparent side effects  hypogonadism, male--he started AndroGel, no side effects, no dysuria or difficulty urinating. No myalgias. He ran out of AndroGel 10 days ago  Current Medications (verified): 1)  Hydrochlorothiazide 25 Mg Tabs (Hydrochlorothiazide) .Marland Kitchen.. 1 By Mouth in Am 2)  Carvedilol 12.5 Mg  Tabs (Carvedilol) .Marland Kitchen.. 1 By Mouth  Two Times A Day 3)  Cozaar 100 Mg  Tabs (Losartan Potassium) .Marland Kitchen.. 1 By Mouth Once Daily Stop Taking Hyzaar 4)  Tricor 145 Mg  Tabs (Fenofibrate) .... Qd 5)  Simvastatin 20 Mg  Tabs (Simvastatin) .Marland Kitchen.. 1 By Mouth At Bedtime 6)  Actonel 150 Mg Tabs (Risedronate Sodium)  .... One By Mouth Monthly  Allergies (verified): 1)  ! Ace Inhibitors 2)  ! Norvasc (Amlodipine Besylate)  Past History:  Past Medical History: Hyperlipidemia, triglycerides are high Hypertension Osteoporosis per DEXA  12-2007 hypogonadism, male cataract B 2010  Past Surgical History: Reviewed history from 02/13/2007 and no changes required. Inguinal herniorrhaphy  Family History: diabetes-- M hypertension--M   coronary artery disease-- no  cancer--no  Social History: Married original from [place] Two children. w/ wife and 1 daughter  28 from [place] to [place] on early 2008. 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 CV:  Denies chest pain or discomfort, palpitations, and swelling of feet. Resp:  Denies shortness of breath; cough resolved . GI:  Denies bloody stools, nausea, and vomiting. GU:  Denies dysuria, hematuria, urinary frequency, and urinary hesitancy.  Physical Exam  General:  alert, well-developed, and overweight-appearing.   Neck:  no masses, no thyromegaly, and normal carotid upstroke.   Lungs:  normal respiratory effort, no intercostal retractions, no accessory  muscle use, and normal breath sounds.   Heart:  normal rate, regular rhythm, no murmur, and no gallop.   Abdomen:  soft, non-tender, no distention, no masses, no guarding, and no rigidity.   Extremities:   no lower extremity edema Psych:  Oriented X3, memory intact for recent and remote, normally interactive, good eye contact, not anxious appearing, and not depressed appearing.     Impression & Recommendations:  Problem # 1:  HEALTH SCREENING (ICD-V70.0) Td -- today  Pneumonia shot 05/2007 shingles immunizations-- info provided  had a Flu shot  never had  a  Colonoscopy, explained benefits, likes to have it done for 12-11, will schedule   DRE and  PSA  normal 7-11  diet and  exercise discussed  Orders: Gastroenterology Referral (GI) Medicare -1st Annual Wellness Visit [phone])  Problem # 2:  OSTEOPOROSIS (ICD-733.00)  continue Actonel continue  HRT His updated medication list for this problem includes:    Actonel 150 Mg Tabs (Risedronate sodium) ..... One by mouth monthly  Problem # 3:  HYPERTENSION (ICD-401.9) at goal  His updated medication list for this problem includes:    Hydrochlorothiazide 25 Mg Tabs (Hydrochlorothiazide) .Marland Kitchen... 1 by mouth in am    Carvedilol 12.5 Mg Tabs (Carvedilol) .Marland Kitchen... 1 by mouth  two times a day    Cozaar 100 Mg Tabs (Losartan potassium) .Marland Kitchen... 1 by mouth once daily stop taking hyzaar  BP today: 126/86 Prior BP: 124/82 (03/31/2010)  Labs Reviewed: K+: 3.5 (09/30/2009) Creat: : 1.1 (09/30/2009)   Chol: 128 (12/26/2008)   HDL: 25.80 (12/26/2008)   LDL: DEL (06/13/2008)   TG: 211.0 (12/26/2008)  Orders: TLB-BMP (Basic Metabolic Panel-BMET) (80048-METABOL) TLB-CBC Platelet - w/Differential (85025-CBCD) Specimen Handling (29562)  Problem # 4:  HYPERLIPIDEMIA (ICD-272.4) due for labs His updated medication list for this problem includes:    Tricor 145 Mg Tabs (Fenofibrate) ..... Qd    Simvastatin 20 Mg Tabs (Simvastatin) .Marland Kitchen... 1 by mouth at bedtime  Labs Reviewed: SGOT: 33 (02/18/2010)   SGPT: 23 (02/18/2010)   HDL:25.80 (12/26/2008), 23.7 (06/13/2008)  LDL:DEL (06/13/2008), 64 (13/03/6577)  Chol:128 (12/26/2008), 142 (06/13/2008)  Trig:211.0 (12/26/2008), 358 (06/13/2008)  Orders: Venipuncture (46962) TLB-Lipid Panel (80061-LIPID) Specimen Handling (95284)  Problem # 5:  HYPOGONADISM, MALE (ICD-257.2) good tolerance to AndroGel, he ran out 10 days ago. Refill meds  check testosterone in 6 weeks, see instructions  Complete Medication List: 1)  Hydrochlorothiazide 25 Mg Tabs (Hydrochlorothiazide) .Marland Kitchen.. 1 by mouth in am 2)  Carvedilol 12.5 Mg Tabs (Carvedilol) .Marland Kitchen.. 1 by mouth  two times  a day 3)  Cozaar 100 Mg Tabs (Losartan potassium) .Marland Kitchen.. 1 by mouth once daily stop taking hyzaar 4)  Tricor 145 Mg Tabs (Fenofibrate) .... Qd 5)  Simvastatin 20 Mg Tabs (Simvastatin) .Marland Kitchen.. 1 by mouth at bedtime 6)  Meloxicam 15 Mg Tabs (Meloxicam) .Marland Kitchen.. 1 by mouth once daily x 3 weeks 7)  Androgel Pump 1 % Gel (Testosterone) .... 2 pumps  daily 8)  Actonel 150 Mg Tabs (Risedronate sodium) .... One by mouth monthly  Other Orders: Tdap => 53yrs IM (13244) Admin 1st Vaccine (01027)  Patient Instructions: 1)  continue all  medications including AndroGel 2)  Come back in 6 weeks by 11 AM for blood work. 3)  Testosterone level---dx hypogonadism 4)  Please schedule a follow-up appointment in 6 months .  Prescriptions: MELOXICAM 15 MG TABS (MELOXICAM) 1 by mouth once daily x 3 weeks  #30 x 6   Entered and Authorized by:  Jose E. Paz MD   Signed by:   Nolon Rod. Paz MD on 05/18/2010   Method used:   Reprint   RxID:   [id number] ANDROGEL PUMP 1 % GEL (TESTOSTERONE) 2 pumps  daily  #1 box x 6   Entered and Authorized by:   Nolon Rod. Paz MD   Signed by:   Nolon Rod. Paz MD on 05/18/2010   Method used:   Print then Give to Patient   RxID:   [id number]    Orders Added: 1)  Venipuncture [84696] 2)  TLB-BMP (Basic Metabolic Panel-BMET) [80048-METABOL] 3)  TLB-Lipid Panel [80061-LIPID] 4)  TLB-CBC Platelet - w/Differential [85025-CBCD] 5)  Specimen Handling [99000] 6)  Tdap => 3yrs IM [90715] 7)  Admin 1st Vaccine [90471] 8)  Gastroenterology Referral [GI] 9)  Est. Patient Level III [29528] 10)  Medicare -1st Annual Wellness Visit [G0438]   Immunizations Administered:  Tetanus Vaccine:    Vaccine Type: Tdap    Site: right deltoid    Mfr: GlaxoSmithKline    Dose: 0.5 ml    Route: IM    Given by: Army Fossa CMA    Exp. Date: 05/21/2012    Lot #: [id number]   Immunizations Administered:  Tetanus Vaccine:    Vaccine Type: Tdap    Site: right deltoid    Mfr:  GlaxoSmithKline    Dose: 0.5 ml    Route: IM    Given by: Army Fossa CMA    Exp. Date: 05/21/2012    Lot #: [id number]

## 2010-09-02 NOTE — Progress Notes (Signed)
Summary: refill--too early  Phone Note Refill Request Message from:  Fax from Pharmacy on April 02, 2010 8:56 AM  Refills Requested: Medication #1:  MELOXICAM 15 MG TABS 1 by mouth once daily x 3 weeks [facility] [address] [name] - fax [phone]  Initial call taken by: Okey Regal [place],  April 02, 2010 8:57 AM  Follow-up for Phone Call        I spoke with the Pharmacist at [facility] on [address] and she states the pt just picked up an rx of Meloxicam Aug 28th. I tried to call pt to tell him it was to early for a refill. Pt was having a hard time understanding what I was explaining to him. could you call? Army Fossa CMA  April 02, 2010 9:24 AM   Additional Follow-up for Phone Call Additional follow up Details #1::        explained the patient above, he is not sure if he actually become the prescription. Asked him to discuss with his pharmacist Additional Follow-up by: Cherolyn Behrle E. Remington Skalsky MD,  April 08, 2010 9:01 AM

## 2010-09-02 NOTE — Letter (Signed)
Summary: [facility]   Imported By: Sherian Rein 04/09/2010 14:34:50  _____________________________________________________________________  External Attachment:    Type:   Image     Comment:   External Document

## 2010-09-02 NOTE — Assessment & Plan Note (Signed)
Summary: er follow-up//lch   Vital Signs:  Patient profile:   77 year old male Height:      66.6 inches Weight:      2016 pounds Pulse rate:   50 / minute BP sitting:   130 / 80  Vitals Entered By: Shary Decamp (September 30, 2009 11:16 AM) CC: hosp f/u -- feeling much better   History of Present Illness: patient was seen in the office with  nausea, vomiting, diarrhea  on  09/19/2009 he was very weak, was referred to the ER   ER records are reviewed he got IV fluids and Zofran urinalysis negative potassium 3.9, creatinine 1.8 CBC showed white count 9.1, hemoglobin 14.4, platelets 255  Current Medications (verified): 1)  Hydrochlorothiazide 25 Mg Tabs (Hydrochlorothiazide) .Marland Kitchen.. 1 By Mouth in Am 2)  Carvedilol 12.5 Mg  Tabs (Carvedilol) .Marland Kitchen.. 1 By Mouth  Two Times A Day 3)  Cozaar 100 Mg  Tabs (Losartan Potassium) .Marland Kitchen.. 1 By Mouth Once Daily Stop Taking Hyzaar 4)  Tricor 145 Mg  Tabs (Fenofibrate) .... Qd 5)  Simvastatin 20 Mg  Tabs (Simvastatin) .Marland Kitchen.. 1 By Mouth At Bedtime 6)  Meloxicam 15 Mg Tabs (Meloxicam) .Marland Kitchen.. 1 By Mouth Once Daily X 3 Weeks  Allergies (verified): 1)  ! Ace Inhibitors 2)  ! Norvasc (Amlodipine Besylate)  Past History:  Past Medical History: Last updated: 06/06/2009 Hyperlipidemia, triglycerides are high Hypertension Osteoporosis per DEXA  12-2007 hypogonadism, male  Past Surgical History: Last updated: 02/13/2007 Inguinal herniorrhaphy  Social History: Last updated: 08/25/2009 Mar[place] original from [place] Two children. Moved from [place] to [place] on early 2008. Tobacco use:  never Passive smoke exposure:  no Alcohol use:   wine - occasionally  Social History: Reviewed history from 08/25/2009 and no changes required. Married original from [place] Two children. Moved from [place] to [place] on early 2008. Tobacco use:  never Passive smoke exposure:  no Alcohol use:   wine - occasionally  Review of Systems    since he left the ER he is feeling better, symptoms resolve within 48 hours currently denies nausea, vomiting, diarrhea or fever he is back taking all his medications without problems  Physical Exam  General:  alert and well-developed.   Lungs:  normal respiratory effort, no intercostal retractions, no accessory muscle use, and normal breath sounds.  No respiratory distress Heart:  normal rate, regular rhythm, and no murmur.   Extremities:  no edema Psych:  Oriented X3, memory intact for recent and remote, normally interactive, good eye contact, not anxious appearing, and not depressed appearing.     Impression & Recommendations:  Problem # 1:  GASTROENTERITIS (ICD-558.9) resolved The following medications were removed from the medication list:    Ondansetron Hcl 4 Mg Tabs (Ondansetron hcl) ..... One by mouth every 6 hours as  needed for nausea  Problem # 2:  HYPERTENSION (ICD-401.9)  creatinine at the ER was a slightly above baseline, most likely due to acute illness recheck BMP His updated medication list for this problem includes:    Hydrochlorothiazide 25 Mg Tabs (Hydrochlorothiazide) .Marland Kitchen... 1 by mouth in am    Carvedilol 12.5 Mg Tabs (Carvedilol) .Marland Kitchen... 1 by mouth  two times a day    Cozaar 100 Mg Tabs (Losartan potassium) .Marland Kitchen... 1 by mouth once daily stop taking hyzaar  BP today: 130/80 Prior BP: 100/60 (09/19/2009)  Labs Reviewed: K+: 3.8 (06/06/2009) Creat: : 1.2 (06/06/2009)   Chol: 128 (12/26/2008)   HDL: 25.80 (  12/26/2008)   LDL: DEL (06/13/2008)   TG: 211.0 (12/26/2008)  Orders: Venipuncture (91478) TLB-BMP (Basic Metabolic Panel-BMET) (80048-METABOL)  Problem # 3:  requests all meds to be RF--- done  Complete Medication List: 1)  Hydrochlorothiazide 25 Mg Tabs (Hydrochlorothiazide) .Marland Kitchen.. 1 by mouth in am 2)  Carvedilol 12.5 Mg Tabs (Carvedilol) .Marland Kitchen.. 1 by mouth  two times a day 3)  Cozaar 100 Mg Tabs (Losartan potassium) .Marland Kitchen.. 1 by mouth once daily stop taking  hyzaar 4)  Tricor 145 Mg Tabs (Fenofibrate) .... Qd 5)  Simvastatin 20 Mg Tabs (Simvastatin) .Marland Kitchen.. 1 by mouth at bedtime 6)  Meloxicam 15 Mg Tabs (Meloxicam) .Marland Kitchen.. 1 by mouth once daily x 3 weeks  Patient Instructions: 1)  Please schedule a follow-up appointment in 2 or  3 months  (fasting -- yearly check up) Prescriptions: SIMVASTATIN 20 MG  TABS (SIMVASTATIN) 1 by mouth at bedtime  #90 x 3   Entered and Authorized by:   Nolon Rod. Dempsey Ahonen MD   Signed by:   Nolon Rod. Kelso Bibby MD on 09/30/2009   Method used:   Electronically to        [id number]* (retail)       [address] [place], [place]  [address]       Ph: [phone]       Fax: [phone]   RxID:   [id number] TRICOR 145 MG  TABS (FENOFIBRATE) QD  #90 x 3   Entered and Authorized by:   Nolon Rod. Madalina Rosman MD   Signed by:   Nolon Rod. Rosalena Mccorry MD on 09/30/2009   Method used:   Electronically to        [id number]* (retail)       [address] [place], [place]  [address]       Ph: [phone]       Fax: [phone]   RxID:   [id number] COZAAR 100 MG  TABS (LOSARTAN POTASSIUM) 1 by mouth once daily STOP TAKING HYZAAR  #90 x 3   Entered and Authorized by:   Elita Quick E. Cahlil Sattar MD   Signed by:   Nolon Rod. Hawthorne Day MD on 09/30/2009   Method used:   Electronically to        [id number]* (retail)       [address] [place], [place]  [address]       Ph: [phone]       Fax: [phone]   RxID:   [id number] CARVEDILOL 12.5 MG  TABS (CARVEDILOL) 1 by mouth  two times a day  #180 x 3   Entered and Authorized by:   Nolon Rod. Grantley Savage MD   Signed by:   Nolon Rod. Klover Priestly MD on 09/30/2009   Method used:   Electronically to        [id number]* (retail)       [address] [place], [place]  [address]       Ph: [phone]       Fax: [phone]   RxID:   [id number] HYDROCHLOROTHIAZIDE 25 MG TABS (HYDROCHLOROTHIAZIDE) 1 by mouth in AM  #90 x 3   Entered and Authorized  by:   Nolon Rod. Nataliya Graig MD   Signed by:   Nolon Rod. Brentlee Sciara MD on 09/30/2009   Method used:  Electronically to        [id number]* (retail)       [address] [place], [place]  [address]       Ph: [phone]       Fax: [phone]   RxID:   [id number]

## 2010-09-03 NOTE — Progress Notes (Signed)
Summary: Movi prep coupon  Phone Note Outgoing Call Call back at [facility] Phone [phone]   Call placed by: Chales Abrahams CMA Duncan Dull),  July 09, 2010 1:18 PM Summary of Call: I asked Lady Gary to call the pt due to language barrier, she advised the pt that I can give them a 20 off coupon for his movi prep because it is not covered by his insurance.  A message was left on his cell phone. Initial call taken by: Chales Abrahams CMA Duncan Dull),  July 09, 2010 1:20 PM  Follow-up for Phone Call        pt has not returned call, I will leave a coupon at the front desk Follow-up by: Chales Abrahams CMA Duncan Dull),  July 13, 2010 10:05 AM     Appended Document: Movi prep coupon coupon for 20 off was faxed to pharmacy

## 2010-09-03 NOTE — Procedures (Signed)
Summary: Colonoscopy  Patient: Maurice Colon Note: All result statuses are Final unless otherwise noted.  Tests: (1) Colonoscopy (COL)   COL Colonoscopy           DONE     [facility]     [address]     [place], [place]  [address]           COLONOSCOPY PROCEDURE REPORT           PATIENT:  Maurice Colon, Maurice Colon  MR#:  [id number]     BIRTHDATE:  04/19/1933, 77 yrs. old  GENDER:  male     ENDOSCOPIST:  Rachael Fee, MD     REF. BY:  Willow Ora, M.D.     PROCEDURE DATE:  07/22/2010     PROCEDURE:  Diagnostic Colonoscopy     ASA CLASS:  Class II     INDICATIONS:  Routine Risk Screening     MEDICATIONS:   Fentanyl 37.5 mcg IV, Versed 2.5 mg IV           DESCRIPTION OF PROCEDURE:   After the risks benefits and     alternatives of the procedure were thoroughly explained, informed     consent was obtained.  Digital rectal exam was performed and     revealed no rectal masses.   The LB [id number] endoscope     was introduced through the anus and advanced to the cecum, which     was identified by both the appendix and ileocecal valve, without     limitations.  The quality of the prep was good, using MoviPrep.     The instrument was then slowly withdrawn as the colon was fully     examined.     <<PROCEDUREIMAGES>>     FINDINGS:  Moderate diverticulosis was found throughout the colon     (see image1).  This was otherwise a normal examination of the     colon (see image2, image4, and image5).   Retroflexed views in the     rectum revealed no abnormalities.    The scope was then withdrawn     from the patient and the procedure completed.     COMPLICATIONS:  None           ENDOSCOPIC IMPRESSION:     1) Moderate diverticulosis throughout the colon     2) Otherwise normal examination; no polyps or cancers           RECOMMENDATIONS:     1) Given your age, you will not need another colonoscopy for     routine colon cancer screening or polyp surveillance. These types     of  tests usually stop around the age 25.           ______________________________     Rachael Fee, MD           n.     eSIGNED:   Rachael Fee at 07/22/2010 10:47 AM           Hardie Pulley, [phone]  Note: An exclamation mark (!) indicates a result that was not dispersed into the flowsheet. Document Creation Date: 07/22/2010 10:47 AM _______________________________________________________________________  (1) Order result status: Final Collection or observation date-time: 07/22/2010 10:43 Requested date-time:  Receipt date-time:  Reported date-time:  Referring Physician:   Ordering Physician: Rob Bunting [id number]) Specimen Source:  Source: Launa Grill Order Number: [id number] Lab site:

## 2010-10-07 ENCOUNTER — Telehealth: Payer: Self-pay | Admitting: Internal Medicine

## 2010-10-07 ENCOUNTER — Ambulatory Visit (INDEPENDENT_AMBULATORY_CARE_PROVIDER_SITE_OTHER): Payer: Medicare Other | Admitting: Internal Medicine

## 2010-10-07 ENCOUNTER — Other Ambulatory Visit: Payer: Self-pay | Admitting: Internal Medicine

## 2010-10-07 ENCOUNTER — Encounter: Payer: Self-pay | Admitting: Internal Medicine

## 2010-10-07 DIAGNOSIS — I10 Essential (primary) hypertension: Secondary | ICD-10-CM

## 2010-10-07 DIAGNOSIS — E785 Hyperlipidemia, unspecified: Secondary | ICD-10-CM

## 2010-10-07 DIAGNOSIS — E291 Testicular hypofunction: Secondary | ICD-10-CM

## 2010-10-08 ENCOUNTER — Ambulatory Visit: Payer: Self-pay | Admitting: Internal Medicine

## 2010-10-13 NOTE — Progress Notes (Signed)
Summary: refill , change androgel  Phone Note Refill Request Message from:  Fax from Pharmacy on October 07, 2010 3:22 PM  Refills Requested: Medication #1:  ANDROGEL PUMP 1 % GEL 2 pumps  daily [facility] [address] [name] - fax [phone]  Initial call taken by: Okey Regal Spring,  October 07, 2010 3:22 PM  Follow-up for Phone Call         okay to refill and [facility] , please note is that I have changed today strength to 1.62%  2 pumps once daily  please arrange for a  testosterone check in 2 months , DX hypogonadism Follow-up by: Nolon Rod. Paz MD,  October 08, 2010 6:26 PM  Additional Follow-up for Phone Call Additional follow up Details #1::        Pt will call to set up appt. Army Fossa CMA  October 09, 2010 8:10 AM     New/Updated Medications: ANDROGEL PUMP 20.25 MG/ACT (1.62%) GEL (TESTOSTERONE) 2 pumps once daily Prescriptions: ANDROGEL PUMP 20.25 MG/ACT (1.62%) GEL (TESTOSTERONE) 2 pumps once daily  #1 x 3   Entered by:   Army Fossa CMA   Authorized by:   Nolon Rod. Paz MD   Signed by:   Army Fossa CMA on 10/09/2010   Method used:   Printed then faxed to ...       [facility]* (retail)       [address] [place], [place]  [address]       Ph: [phone]       Fax: [phone]   RxID:   [id number]

## 2010-10-13 NOTE — Assessment & Plan Note (Signed)
Summary: med refill///sph  --appt made by daughter/res/cbs   Vital Signs:  Patient profile:   77 year old male Weight:      201.38 pounds Pulse rate:   76 / minute Pulse rhythm:   regular BP sitting:   122 / 78  (left arm) Cuff size:   large  Vitals Entered By: Army Fossa CMA (October 07, 2010 11:27 AM) CC: Med refil- not fasting Comments not fasting  [facility] [address] [facility] [place]    History of Present Illness: ROV doing well recent URI, had OTCs , better  ROS diet improved  ambulatory BPs  ~ 127/80 good medication compliance w/ androgel 1% 2 pumps once daily good medication compliance w / Actonel-ca-viot d       Current Medications (verified): 1)  Hydrochlorothiazide 25 Mg Tabs (Hydrochlorothiazide) .Marland Kitchen.. 1 By Mouth in Am 2)  Carvedilol 12.5 Mg  Tabs (Carvedilol) .Marland Kitchen.. 1 By Mouth  Two Times A Day 3)  Cozaar 100 Mg  Tabs (Losartan Potassium) .Marland Kitchen.. 1 By Mouth Once Daily Stop Taking Hyzaar 4)  Tricor 145 Mg  Tabs (Fenofibrate) .... Qd 5)  Simvastatin 20 Mg  Tabs (Simvastatin) .Marland Kitchen.. 1 By Mouth At Bedtime 6)  Androgel Pump 1 % Gel (Testosterone) .... 2 Pumps  Daily 7)  Actonel 150 Mg Tabs (Risedronate Sodium) .... One By Mouth Monthly  Allergies (verified): 1)  ! Ace Inhibitors 2)  ! Norvasc (Amlodipine Besylate)  Past History:  Past Medical History: Reviewed history from 05/18/2010 and no changes required. Hyperlipidemia, triglycerides are high Hypertension Osteoporosis per DEXA  12-2007 hypogonadism, male cataract B 2010  Past Surgical History: Reviewed history from 02/13/2007 and no changes required. Inguinal herniorrhaphy  Physical Exam  General:  alert and well-developed.  Lungs:  normal respiratory effort, no intercostal retractions, no accessory muscle use, and normal breath sounds.   Heart:  normal rate, regular rhythm, no murmur, and no gallop.   Extremities:   no lower extremity edema   Impression & Recommendations:  Problem # 1:   HYPERTENSION (ICD-401.9) at goal  His updated medication list for this problem includes:    Hydrochlorothiazide 25 Mg Tabs (Hydrochlorothiazide) .Marland Kitchen... 1 by mouth in am    Carvedilol 12.5 Mg Tabs (Carvedilol) .Marland Kitchen... 1 by mouth  two times a day    Cozaar 100 Mg Tabs (Losartan potassium) .Marland Kitchen... 1 by mouth once daily stop taking hyzaar  BP today: 122/78 Prior BP: 128/86 (06/24/2010)  Labs Reviewed: K+: 3.8 (05/18/2010) Creat: : 1.1 (05/18/2010)   Chol: 129 (05/18/2010)   HDL: 23.10 (05/18/2010)   LDL: DEL (06/13/2008)   TG: 368.0 (05/18/2010)  Problem # 2:  HYPERLIPIDEMIA (ICD-272.4)  last triglycerides were elevated, encouraged a healthy diet and exercise. Recheck labs on return to the office His updated medication list for this problem includes:    Tricor 145 Mg Tabs (Fenofibrate) ..... Qd    Simvastatin 20 Mg Tabs (Simvastatin) .Marland Kitchen... 1 by mouth at bedtime  Labs Reviewed: SGOT: 33 (02/18/2010)   SGPT: 23 (02/18/2010)   HDL:23.10 (05/18/2010), 25.80 (12/26/2008)  LDL:DEL (06/13/2008), 64 (62/13/0865)  Chol:129 (05/18/2010), 128 (12/26/2008)  Trig:368.0 (05/18/2010), 211.0 (12/26/2008)  Problem # 3:  HYPOGONADISM, MALE (ICD-257.2) good medication compliance w/ androgel 1% 2 pumps once daily  however the patient reports that the formulation needs to be changed due to apparently some insurance  constraints,  see  instructions, I will prescribe whatever is covered for him Orders: Venipuncture (78469) TLB-Testosterone, Total (84403-TESTO) Specimen Handling (62952)  Complete  Medication List: 1)  Hydrochlorothiazide 25 Mg Tabs (Hydrochlorothiazide) .Marland Kitchen.. 1 by mouth in am 2)  Carvedilol 12.5 Mg Tabs (Carvedilol) .Marland Kitchen.. 1 by mouth  two times a day 3)  Cozaar 100 Mg Tabs (Losartan potassium) .Marland Kitchen.. 1 by mouth once daily stop taking hyzaar 4)  Tricor 145 Mg Tabs (Fenofibrate) .... Qd 5)  Simvastatin 20 Mg Tabs (Simvastatin) .Marland Kitchen.. 1 by mouth at bedtime 6)  Androgel Pump 1 % Gel (Testosterone) .... 2  pumps  daily 7)  Actonel 150 Mg Tabs (Risedronate sodium) .... One by mouth monthly  Patient Instructions: 1)  please ask the pharmacyst to call me with the name of the new testosterone formulation that you have 2)  Please schedule a follow-up appointment in 4 months .  Prescriptions: SIMVASTATIN 20 MG  TABS (SIMVASTATIN) 1 by mouth at bedtime  #90 x 3   Entered by:   Army Fossa CMA   Authorized by:   Nolon Rod. Augusten Lipkin MD   Signed by:   Army Fossa CMA on 10/07/2010   Method used:   Electronically to        [facility]* (retail)       [address] [place], [place]  [address]       Ph: [phone]       Fax: [phone]   RxID:   [id number] TRICOR 145 MG  TABS (FENOFIBRATE) QD  #90 x 3   Entered by:   Army Fossa CMA   Authorized by:   Nolon Rod. Mansoor Hillyard MD   Signed by:   Army Fossa CMA on 10/07/2010   Method used:   Electronically to        [facility]* (retail)       [address] [place], [place]  [address]       Ph: [phone]       Fax: [phone]   RxID:   [id number] COZAAR 100 MG  TABS (LOSARTAN POTASSIUM) 1 by mouth once daily STOP TAKING HYZAAR  #90 x 3   Entered by:   Army Fossa CMA   Authorized by:   Nolon Rod. Binh Doten MD   Signed by:   Army Fossa CMA on 10/07/2010   Method used:   Electronically to        [facility]* (retail)       [address] [place], [place]  [address]       Ph: [phone]       Fax: [phone]   RxID:   [id number] CARVEDILOL 12.5 MG  TABS (CARVEDILOL) 1 by mouth  two times a day  #180 x 3   Entered by:   Army Fossa CMA   Authorized by:   Nolon Rod. Khiara Shuping MD   Signed by:   Army Fossa CMA on 10/07/2010   Method used:   Electronically to        [facility]* (retail)       [address] [place], [place]  [address]       Ph: [phone]       Fax: [phone]   RxID:   [id number] HYDROCHLOROTHIAZIDE 25 MG TABS (HYDROCHLOROTHIAZIDE) 1 by mouth in AM  #90 x 3    Entered by:   Army Fossa CMA   Authorized by:   Nolon Rod. Amea Mcphail MD  Signed by:   Army Fossa CMA on 10/07/2010   Method used:   Electronically to        [facility]* (retail)       [address] [place], [place]  [address]       Ph: [phone]       Fax: [phone]   RxID:   [id number]    Orders Added: 1)  Venipuncture [[address]] 2)  TLB-Testosterone, Total [84403-TESTO] 3)  Specimen Handling [99000] 4)  Est. Patient Level III [[address]]

## 2010-10-14 ENCOUNTER — Telehealth (INDEPENDENT_AMBULATORY_CARE_PROVIDER_SITE_OTHER): Payer: Self-pay | Admitting: *Deleted

## 2010-10-22 LAB — DIFFERENTIAL
Eosinophils Absolute: 0.1 10*3/uL (ref 0.0–0.7)
Lymphs Abs: 0.5 10*3/uL — ABNORMAL LOW (ref 0.7–4.0)
Monocytes Absolute: 0.3 10*3/uL (ref 0.1–1.0)
Monocytes Relative: 3 % (ref 3–12)
Neutrophils Relative %: 90 % — ABNORMAL HIGH (ref 43–77)

## 2010-10-22 LAB — URINE MICROSCOPIC-ADD ON

## 2010-10-22 LAB — BASIC METABOLIC PANEL
BUN: 28 mg/dL — ABNORMAL HIGH (ref 6–23)
GFR calc non Af Amer: 37 mL/min — ABNORMAL LOW (ref >60)
Potassium: 3.9 mEq/L (ref 3.5–5.1)
Sodium: 134 mEq/L — ABNORMAL LOW (ref 135–145)

## 2010-10-22 LAB — URINALYSIS, ROUTINE W REFLEX MICROSCOPIC
Glucose, UA: NEGATIVE mg/dL
Hgb urine dipstick: NEGATIVE
Protein, ur: NEGATIVE mg/dL

## 2010-10-22 LAB — CBC
HCT: 41 % (ref 39.0–52.0)
Hemoglobin: 14.4 g/dL (ref 13.0–17.0)
Platelets: 255 10*3/uL (ref 150–400)
WBC: 9.1 10*3/uL (ref 4.0–10.5)

## 2010-10-29 NOTE — Progress Notes (Signed)
Summary: prior auth-appoved   Phone Note Refill Request Message from:  Fax from Pharmacy on October 14, 2010 9:40 AM  Refills Requested: Medication #1:  ANDROGEL PUMP 20.25 MG/ACT (1.62%) GEL 2 pumps once daily prior auth - [id number] ----id [id number] -   given another prior auth#- [id number]  Initial call taken by: Okey Regal Spring,  October 14, 2010 9:41 AM  Follow-up for Phone Call        Spoke w/ Judyann Munson informed that medication has been approved for 1 yr. but will received faxed confirmation they will also contact pharmacy and patient to let them know.Marland KitchenMarland KitchenDoristine Devoid CMA  October 15, 2010 9:58 AM

## 2011-01-12 ENCOUNTER — Encounter: Payer: Self-pay | Admitting: Internal Medicine

## 2011-01-12 ENCOUNTER — Ambulatory Visit (INDEPENDENT_AMBULATORY_CARE_PROVIDER_SITE_OTHER): Payer: Medicare Other | Admitting: Internal Medicine

## 2011-01-12 DIAGNOSIS — K5289 Other specified noninfective gastroenteritis and colitis: Secondary | ICD-10-CM

## 2011-01-12 DIAGNOSIS — K529 Noninfective gastroenteritis and colitis, unspecified: Secondary | ICD-10-CM | POA: Insufficient documentation

## 2011-01-12 NOTE — Assessment & Plan Note (Signed)
See above

## 2011-01-12 NOTE — Patient Instructions (Signed)
Please make an appointment for next month routine visit

## 2011-01-12 NOTE — Progress Notes (Signed)
  Subjective:    Patient ID: Maurice Colon, male    DOB: [birth year], 77 y.o.   MRN: [id number]  HPI Acute visit: Developed diarrhea 2 days ago; sx started  at 8 PM, 2 episodes of watery stools . After the diarrhea, he developed a crampy abdominal pain and experienced several episodes of nausea and vomiting. He vomited mostly undigested food. At 4 AM he had another episode of diarrhea and since then he is improving. Patient wonders if symptoms related to food he got at the mall hours prior to the problems. Interestingly, this morning he developed pain in the left ankle, denies swelling, not recent injury that he can tell.  Past Medical History  Diagnosis Date  . Hyperlipidemia     triglycerides are high  . Hypertension   . Osteoporosis   . Hypogonadism, male   . Cataract 2010   Past Surgical History  Procedure Date  . Inguinal hernia repair       Review of Systems As of today, no further nausea or vomiting, no further abdominal pain except some soreness from vomiting. He had a bowel movement today, stools normal. Denies hematemesis or blood in the stools.     Objective:   Physical Exam Alert, oriented, in no apparent distress. Not pale or jaundice. Abdomen: Soft, nontender, no mass, good bowel sounds. Extremities: No edema, good bilateral pedal pulses. Right ankle normal. Left ankle: slightly tender to touch, no warm-red-swelling.       Assessment & Plan:  Acute gastroenteritis: Resolving,  recommend observation. Left ankle pain: Normal exam, denies history of gout. Recommend observation as well. Tylenol as needed. If symptoms continue or increase he will let me know.

## 2011-02-11 ENCOUNTER — Encounter: Payer: Self-pay | Admitting: Internal Medicine

## 2011-02-11 ENCOUNTER — Ambulatory Visit (INDEPENDENT_AMBULATORY_CARE_PROVIDER_SITE_OTHER): Payer: Medicare Other | Admitting: Internal Medicine

## 2011-02-11 DIAGNOSIS — M810 Age-related osteoporosis without current pathological fracture: Secondary | ICD-10-CM

## 2011-02-11 DIAGNOSIS — E291 Testicular hypofunction: Secondary | ICD-10-CM

## 2011-02-11 DIAGNOSIS — E785 Hyperlipidemia, unspecified: Secondary | ICD-10-CM

## 2011-02-11 DIAGNOSIS — I10 Essential (primary) hypertension: Secondary | ICD-10-CM

## 2011-02-11 DIAGNOSIS — L719 Rosacea, unspecified: Secondary | ICD-10-CM | POA: Insufficient documentation

## 2011-02-11 DIAGNOSIS — M1990 Unspecified osteoarthritis, unspecified site: Secondary | ICD-10-CM

## 2011-02-11 LAB — BASIC METABOLIC PANEL
BUN: 21 mg/dL (ref 6–23)
CO2: 24 mEq/L (ref 19–32)
Calcium: 9.2 mg/dL (ref 8.4–10.5)
Glucose, Bld: 99 mg/dL (ref 70–99)
Potassium: 3.7 mEq/L (ref 3.5–5.1)
Sodium: 140 mEq/L (ref 135–145)

## 2011-02-11 MED ORDER — MELOXICAM 7.5 MG PO TABS: 15.0000 mg | ORAL_TABLET | Freq: Every day | ORAL | Status: DC | PRN

## 2011-02-11 MED ORDER — METRONIDAZOLE 1 % EX GEL: Freq: Every day | CUTANEOUS | Status: DC

## 2011-02-11 NOTE — Assessment & Plan Note (Signed)
Unable to afford testosterone. Does not feel any different without HRT

## 2011-02-11 NOTE — Assessment & Plan Note (Signed)
Recommend sun avoidance and Flagyl

## 2011-02-11 NOTE — Assessment & Plan Note (Signed)
Well controlled 

## 2011-02-11 NOTE — Patient Instructions (Signed)
Take meloxicam with food, watch for stomach irritation. Avoid excessive sun exposure.

## 2011-02-11 NOTE — Assessment & Plan Note (Addendum)
Unable to afford Actonel. In the past she tolerated the Fosamax well. Options discussed--- fosamax? Reclast? Elected reclast but will go for fosamax if for whatever reason unable to get reclast. Will arrange

## 2011-02-11 NOTE — Assessment & Plan Note (Signed)
Occasionally BP elevated in the afternoon depending on diet, counseled about a healthy diet and avoiding salty foods.

## 2011-02-11 NOTE — Assessment & Plan Note (Signed)
In the past he took meloxicam w/o problems for OA: RF, GI s/e discussed

## 2011-02-11 NOTE — Progress Notes (Signed)
  Subjective:    Patient ID: Maurice Colon, male    DOB: 09/28/1932, 77 y.o.   MRN: [id number]  HPI Routine office visit, doing well. New problem: Has noted his nose to be "bigger" gradually over the years (rosacea) Hypertension, good medication compliance, ambulatory blood pressures are very good in the morning, from time to time BP go up to the 160s depending on his diet. Osteoarthritis, in the past he took meloxicam  successfully, no GI side effects. He ran out of the medication. Refill? Osteoporosis: Unable to afford actonel. Alternative? Hypogonadism: Unable to afford testosterone.  Review of Systems Other than difficulty w/ affording meds, he is doing well     Objective:   Physical Exam  Constitutional: He is oriented to person, place, and time. He appears well-developed.  HENT:  Head: Normocephalic and atraumatic.  Cardiovascular: Normal rate, regular rhythm and normal heart sounds.   No murmur heard. Pulmonary/Chest: Effort normal and breath sounds normal. No respiratory distress. He has no wheezes.  Musculoskeletal: He exhibits no edema.  Neurological: He is alert and oriented to person, place, and time.  Skin: Skin is warm and dry.       Nose: Changes consistent with mild to moderate rosacea---> thick-oily skin, no pustules. Very few-small telangiectasias. The rest of the face unaffected.  Psychiatric: He has a normal mood and affect. His behavior is normal. Judgment and thought content normal.          Assessment & Plan:  Today , I spent more than 25  min with the patient, >50% of the time counseling, and advising about the treatment of rosacea,  and options for treatment of osteoporosis

## 2011-02-18 ENCOUNTER — Telehealth: Payer: Self-pay | Admitting: *Deleted

## 2011-02-18 NOTE — Telephone Encounter (Signed)
Spoke w/ Reclast company. Pts insurance will only pay 80% of reclast, pt will be left w/ the remaining 20% and possibly admin fee. It could roughly be around 200+ dollars. Please advise if there is an alternative for pt.

## 2011-02-19 NOTE — Telephone Encounter (Signed)
Advised patient about the cost, if he is unable to afford, call Fosamax 70 mg weekly

## 2011-02-19 NOTE — Telephone Encounter (Signed)
Message left for patient to return my call.  

## 2011-02-22 NOTE — Telephone Encounter (Signed)
Message left for patient to return my call.  

## 2011-02-23 NOTE — Telephone Encounter (Signed)
Message left for patient to return my call.  

## 2011-02-24 ENCOUNTER — Encounter: Payer: Self-pay | Admitting: *Deleted

## 2011-02-24 NOTE — Telephone Encounter (Signed)
Will mail letter asking pt to contact office.

## 2011-06-29 ENCOUNTER — Other Ambulatory Visit: Payer: Self-pay | Admitting: Internal Medicine

## 2011-07-01 MED ORDER — MELOXICAM 7.5 MG PO TABS: 15.0000 mg | ORAL_TABLET | Freq: Every day | ORAL | Status: DC | PRN

## 2011-07-01 NOTE — Telephone Encounter (Signed)
Rx sent to pharmacy   

## 2011-10-18 ENCOUNTER — Telehealth: Payer: Self-pay | Admitting: Internal Medicine

## 2011-10-18 MED ORDER — CARVEDILOL 12.5 MG PO TABS: 12.5000 mg | ORAL_TABLET | Freq: Two times a day (BID) | ORAL | Status: DC

## 2011-10-18 MED ORDER — LOSARTAN POTASSIUM 100 MG PO TABS: 100.0000 mg | ORAL_TABLET | Freq: Every day | ORAL | Status: DC

## 2011-10-18 MED ORDER — SIMVASTATIN 20 MG PO TABS: 20.0000 mg | ORAL_TABLET | Freq: Every day | ORAL | Status: DC

## 2011-10-18 MED ORDER — METRONIDAZOLE 1 % EX GEL: Freq: Every day | CUTANEOUS | Status: DC

## 2011-10-18 MED ORDER — FENOFIBRATE 145 MG PO TABS: 145.0000 mg | ORAL_TABLET | Freq: Every day | ORAL | Status: DC

## 2011-10-18 MED ORDER — MELOXICAM 7.5 MG PO TABS: 15.0000 mg | ORAL_TABLET | Freq: Every day | ORAL | Status: DC | PRN

## 2011-10-18 MED ORDER — HYDROCHLOROTHIAZIDE 25 MG PO TABS: 25.0000 mg | ORAL_TABLET | Freq: Every day | ORAL | Status: DC

## 2011-10-18 NOTE — Telephone Encounter (Signed)
I got an error message through Epic, apparently needs RF. Please send a 90 days supply, no RF

## 2011-10-18 NOTE — Telephone Encounter (Signed)
All   meds  ?

## 2011-10-18 NOTE — Telephone Encounter (Signed)
Refill on what?

## 2011-10-18 NOTE — Telephone Encounter (Signed)
Refill done.  

## 2011-11-02 ENCOUNTER — Encounter: Payer: Self-pay | Admitting: Internal Medicine

## 2011-11-02 ENCOUNTER — Ambulatory Visit (INDEPENDENT_AMBULATORY_CARE_PROVIDER_SITE_OTHER): Payer: Medicare Other | Admitting: Internal Medicine

## 2011-11-02 DIAGNOSIS — E785 Hyperlipidemia, unspecified: Secondary | ICD-10-CM

## 2011-11-02 DIAGNOSIS — I10 Essential (primary) hypertension: Secondary | ICD-10-CM

## 2011-11-02 DIAGNOSIS — M810 Age-related osteoporosis without current pathological fracture: Secondary | ICD-10-CM

## 2011-11-02 DIAGNOSIS — R972 Elevated prostate specific antigen [PSA]: Secondary | ICD-10-CM

## 2011-11-02 DIAGNOSIS — M1990 Unspecified osteoarthritis, unspecified site: Secondary | ICD-10-CM

## 2011-11-02 DIAGNOSIS — Z0000 Encounter for general adult medical examination without abnormal findings: Secondary | ICD-10-CM | POA: Insufficient documentation

## 2011-11-02 DIAGNOSIS — Z125 Encounter for screening for malignant neoplasm of prostate: Secondary | ICD-10-CM

## 2011-11-02 MED ORDER — SIMVASTATIN 20 MG PO TABS: 20.0000 mg | ORAL_TABLET | Freq: Every day | ORAL | Status: DC

## 2011-11-02 MED ORDER — ALENDRONATE SODIUM 70 MG PO TABS: 70.0000 mg | ORAL_TABLET | ORAL | Status: DC

## 2011-11-02 MED ORDER — FENOFIBRATE 150 MG PO CAPS: 1.0000 | ORAL_CAPSULE | Freq: Every day | ORAL | Status: DC

## 2011-11-02 MED ORDER — ZOSTER VACCINE LIVE 19400 UNT/0.65ML ~~LOC~~ SOLR: 0.6500 mL | Freq: Once | SUBCUTANEOUS | Status: AC

## 2011-11-02 MED ORDER — CARVEDILOL 12.5 MG PO TABS: 12.5000 mg | ORAL_TABLET | Freq: Two times a day (BID) | ORAL | Status: DC

## 2011-11-02 MED ORDER — LOSARTAN POTASSIUM-HCTZ 100-25 MG PO TABS: 1.0000 | ORAL_TABLET | Freq: Every day | ORAL | Status: DC

## 2011-11-02 NOTE — Assessment & Plan Note (Addendum)
DEXA 03-2010 showed osteoporosis. Has not been treated, unable to afford Actonel, reclast not covered He is not  taking Fosamax Plan: Fosamax Next dexa 03-2012  Check vitamin D

## 2011-11-02 NOTE — Assessment & Plan Note (Addendum)
Ran out of meds temporarily last month. We'll consolidate losartan and HCTZ in a single pill

## 2011-11-02 NOTE — Assessment & Plan Note (Addendum)
Td -- 2011 Pneumonia shot 05/2007 shingles immunizations--  Rx provided , benefits discussed   Colonoscopy 12-11,  (-), no further screening scopes R/o elevated PSA, labs  Diet, exercise discussed

## 2011-11-02 NOTE — Assessment & Plan Note (Signed)
Compliance with simvastatin very good, poor compliance with TriCor due to the cost. Change to a generic fenofibrate. labs on return to the office

## 2011-11-02 NOTE — Assessment & Plan Note (Signed)
On meloxicam as needed

## 2011-11-02 NOTE — Progress Notes (Signed)
  Subjective:    Patient ID: Maurice Colon, male    DOB: [birth year], 78 y.o.   MRN: [id number]  HPI History of Present Illness: Here for Medicare AWV: 1. Risk factors based on Past M, S, F history: reviewed   2. Physical Activities: active at work, no exercise  3. Depression/mood: denies , no problems noted  4. Hearing: denies problems , no problems noted  5. ADL's: totally independent  6. Fall Risk: low risk, no recent falls, no balance issues  7. Home Safety: feels safe at home  8. Height, weight, &visual acuity: see VS, s/p cataract surgery B w/ good results, sees eye doctor yearly   9. Counseling: yes, see below  10. Labs ordered based on risk factors: yes  11.           Referral Coordination, if needed  12.           Care Plan, see a/p  13.            Cognitive Assessment: motor  skills, memory, cognition seems appropriate  in addition, we discussed the following issues Hypertension--  run out of medications for a couple of weeks last month, back on them. BP was elevated for a while. Hyperlipidemia, good compliance with simvastatin, but  ran out of tricor, cost is an issue  Osteoporosis -- currently on no medicines, few months ago we explore reclast  But it wasn't covered by his insurance. I recommended Fosamax but he never started. DJD, meloxicam helps, no apparent side effects.  Past Medical History: Hyperlipidemia, triglycerides are high Hypertension Osteoporosis per DEXA  12-2007 hypogonadism, male Rosacea DJD  Past Surgical History: Cataracts B Inguinal herniorrhaphy  Family History: diabetes-- M hypertension--M  coronary artery disease-- no Strokes-- F at age 56 cancer--no GF lived to be 76 y/o  Social History: Married, 2 children, w/ wife and 1 daughter  original from [place].Moved to  [place] on early 2008. 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 No diarrhea or blood in the stools.    Objective:   Physical Exam  General:  alert, well-developed, and overweight-appearing.   Neck:  no masses, no thyromegaly, and normal carotid upstroke.   Lungs:  normal respiratory effort, no intercostal retractions, no accessory muscle use, and normal breath sounds.   Heart:  normal rate, regular rhythm, no murmur, and no gallop.   Abdomen:  soft, non-tender, no distention, no masses, no guarding, and no rigidity.   GU-- rectal exam shows a normal prostate, no nodularity or tenderness. No masses. Brown stools. Extremities:   no lower extremity edema Psych:  Oriented X3, memory intact for recent and remote, normally interactive, good eye contact, not anxious appearing, and not depressed appearing.       Assessment & Plan:  has run out off several medications, others are very expensive. I spent more than 25 minutes going over the new medication list to ensure compliance,  changes were made to decrease the pill burden and cost.

## 2011-11-03 ENCOUNTER — Telehealth: Payer: Self-pay | Admitting: Internal Medicine

## 2011-11-03 LAB — CBC WITH DIFFERENTIAL/PLATELET
Basophils Absolute: 0 10*3/uL (ref 0.0–0.1)
Eosinophils Absolute: 0.3 10*3/uL (ref 0.0–0.7)
MCHC: 33.3 g/dL (ref 30.0–36.0)
MCV: 93.2 fl (ref 78.0–100.0)
Monocytes Absolute: 0.5 10*3/uL (ref 0.1–1.0)
Neutrophils Relative %: 44.3 % (ref 43.0–77.0)
Platelets: 228 10*3/uL (ref 150.0–400.0)

## 2011-11-03 LAB — BASIC METABOLIC PANEL
BUN: 22 mg/dL (ref 6–23)
CO2: 25 mEq/L (ref 19–32)
Chloride: 105 mEq/L (ref 96–112)
Creatinine, Ser: 1.1 mg/dL (ref 0.4–1.5)

## 2011-11-03 LAB — PSA: PSA: 1.12 ng/mL (ref 0.10–4.00)

## 2011-11-03 LAB — TSH: TSH: 2.47 u[IU]/mL (ref 0.35–5.50)

## 2011-11-03 NOTE — Telephone Encounter (Signed)
LMOVM for pt's son to return call.  

## 2011-11-03 NOTE — Telephone Encounter (Signed)
Patients son Maurice Colon, called & states the pharmacy does not have the following from yesterdays visit Zoster Vaccine  He also states there should be one for MetroNIDAZOLE (Gel) METROGEL 1 % Apply topically daily.  do not see on yestereday's visit list Can call son Maurice Colon at [phone]  Or  Patient Maurice Colon at [phone]

## 2011-11-10 MED ORDER — ERGOCALCIFEROL 1.25 MG (50000 UT) PO CAPS: 50000.0000 [IU] | ORAL_CAPSULE | ORAL | Status: DC

## 2011-11-10 NOTE — Progress Notes (Signed)
Addended by: Edwena Felty T on: 11/10/2011 11:50 AM   Modules accepted: Orders

## 2012-01-24 ENCOUNTER — Other Ambulatory Visit: Payer: Self-pay | Admitting: Internal Medicine

## 2012-01-24 NOTE — Telephone Encounter (Signed)
Refill done.  

## 2012-04-04 ENCOUNTER — Ambulatory Visit (INDEPENDENT_AMBULATORY_CARE_PROVIDER_SITE_OTHER): Payer: Medicare Other | Admitting: Internal Medicine

## 2012-04-04 ENCOUNTER — Encounter: Payer: Self-pay | Admitting: Internal Medicine

## 2012-04-04 VITALS — BP 122/76 | HR 45 | Temp 97.9°F | Wt 201.0 lb

## 2012-04-04 DIAGNOSIS — I10 Essential (primary) hypertension: Secondary | ICD-10-CM

## 2012-04-04 DIAGNOSIS — M810 Age-related osteoporosis without current pathological fracture: Secondary | ICD-10-CM

## 2012-04-04 DIAGNOSIS — E785 Hyperlipidemia, unspecified: Secondary | ICD-10-CM

## 2012-04-04 MED ORDER — FENOFIBRATE 145 MG PO TABS: 145.0000 mg | ORAL_TABLET | Freq: Every day | ORAL | Status: DC

## 2012-04-04 MED ORDER — CLOTRIMAZOLE-BETAMETHASONE 1-0.05 % EX CREA: TOPICAL_CREAM | Freq: Two times a day (BID) | CUTANEOUS | Status: DC

## 2012-04-04 NOTE — Assessment & Plan Note (Signed)
BP excellent today, good compliance with medicine. No change

## 2012-04-04 NOTE — Assessment & Plan Note (Signed)
Likes to go back to [facility], generic did not work for him. Due for labs, will come back to the office fasting

## 2012-04-04 NOTE — Progress Notes (Signed)
  Subjective:    Patient ID: Maurice Colon, male    DOB: 1933-05-19, 78 y.o.   MRN: [id number]  HPI Routine office visit, doing well High cholesterol, good compliance of medications, would like a refill on TriCor, he prefers that over the generic. Complains of a very itchy spot at the left foot for the last 4 days, over-the-counter medication did not help. History of low vitamin D, good compliance with ergocalciferol, currently taking over-the-counter vitamin D supplements  Past Medical History: Hyperlipidemia, triglycerides are high Hypertension Osteoporosis per DEXA  12-2007 hypogonadism, male Rosacea DJD  Past Surgical History: Cataracts B Inguinal herniorrhaphy  Family History: diabetes-- M hypertension--M   coronary artery disease-- no Strokes-- F at age 12 cancer--no GF lived to be 78 y/o  Social History: Married, 2 children, w/ wife and 1 daughter   original from [place].Moved to  [place] on early 2008. 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 No apparent distress.  Lungs -- normal respiratory effort, no intercostal retractions, no accessory muscle use, and normal breath sounds.   Heart-- normal rate, regular rhythm, no murmur, and no gallop.   Extremities-- no pretibial edema bilaterally  Neurologic-- alert & oriented X3 and strength normal in all extremities. Psych-- Cognition and judgment appear intact. Alert and cooperative with normal attention span and concentration.  not anxious appearing and not depressed appearing.        Assessment & Plan:   Rash, L foot, ?fungal infection; plan-- lotrisone, call if no better

## 2012-04-04 NOTE — Patient Instructions (Addendum)
Please come back fasting tomorrow: FLP, AST, ALT---- dx  high cholesterol BMP-- dx HTN Vitamin D--- dx  vitamin D deficiency Use the cream as prescribed for the next 10 days, call if not better Next office visit around 11/01/2012 for a complete physical exam, fasting

## 2012-04-04 NOTE — Assessment & Plan Note (Signed)
Good compliance with Fosamax, status post ergocalciferol due to vitamin D deficiency. Plan: Due for a bone density test, we'll schedule Check vitamin D To take vitamin D and calcium daily OTC

## 2012-04-05 ENCOUNTER — Other Ambulatory Visit (INDEPENDENT_AMBULATORY_CARE_PROVIDER_SITE_OTHER): Payer: Medicare Other

## 2012-04-05 DIAGNOSIS — I10 Essential (primary) hypertension: Secondary | ICD-10-CM

## 2012-04-05 DIAGNOSIS — E7800 Pure hypercholesterolemia, unspecified: Secondary | ICD-10-CM

## 2012-04-05 DIAGNOSIS — E509 Vitamin A deficiency, unspecified: Secondary | ICD-10-CM

## 2012-04-05 LAB — BASIC METABOLIC PANEL
Chloride: 106 mEq/L (ref 96–112)
Creatinine, Ser: 1.1 mg/dL (ref 0.4–1.5)
Potassium: 3.5 mEq/L (ref 3.5–5.1)

## 2012-04-05 LAB — AST: AST: 39 U/L — ABNORMAL HIGH (ref 0–37)

## 2012-04-05 LAB — ALT: ALT: 29 U/L (ref 0–53)

## 2012-04-05 NOTE — Progress Notes (Signed)
Labs only

## 2012-04-07 ENCOUNTER — Other Ambulatory Visit: Payer: Self-pay | Admitting: Internal Medicine

## 2012-04-07 DIAGNOSIS — M810 Age-related osteoporosis without current pathological fracture: Secondary | ICD-10-CM

## 2012-04-08 LAB — VITAMIN D 1,25 DIHYDROXY
Vitamin D 1, 25 (OH)2 Total: 36 pg/mL (ref 18–72)
Vitamin D3 1, 25 (OH)2: 15 pg/mL

## 2012-04-13 ENCOUNTER — Encounter: Payer: Self-pay | Admitting: Internal Medicine

## 2012-05-02 ENCOUNTER — Encounter: Payer: Self-pay | Admitting: Internal Medicine

## 2012-06-01 ENCOUNTER — Ambulatory Visit (INDEPENDENT_AMBULATORY_CARE_PROVIDER_SITE_OTHER)
Admission: RE | Admit: 2012-06-01 | Discharge: 2012-06-01 | Disposition: A | Discharge status: Home / Self Care | Payer: Medicare Other | Source: Ambulatory Visit | Attending: Internal Medicine | Admitting: Internal Medicine

## 2012-06-01 DIAGNOSIS — M810 Age-related osteoporosis without current pathological fracture: Secondary | ICD-10-CM

## 2012-06-02 ENCOUNTER — Other Ambulatory Visit: Payer: Medicare Other

## 2012-06-05 ENCOUNTER — Encounter: Payer: Self-pay | Admitting: *Deleted

## 2012-08-30 ENCOUNTER — Encounter: Payer: Self-pay | Admitting: Internal Medicine

## 2012-08-30 ENCOUNTER — Ambulatory Visit (INDEPENDENT_AMBULATORY_CARE_PROVIDER_SITE_OTHER): Payer: Medicare Other | Admitting: Internal Medicine

## 2012-08-30 VITALS — BP 140/74 | HR 50 | Temp 98.3°F | Wt 200.0 lb

## 2012-08-30 DIAGNOSIS — E785 Hyperlipidemia, unspecified: Secondary | ICD-10-CM

## 2012-08-30 DIAGNOSIS — L719 Rosacea, unspecified: Secondary | ICD-10-CM

## 2012-08-30 DIAGNOSIS — I10 Essential (primary) hypertension: Secondary | ICD-10-CM

## 2012-08-30 DIAGNOSIS — M810 Age-related osteoporosis without current pathological fracture: Secondary | ICD-10-CM

## 2012-08-30 MED ORDER — FENOFIBRATE 145 MG PO TABS: 145.0000 mg | ORAL_TABLET | Freq: Every day | ORAL | Status: DC

## 2012-08-30 MED ORDER — SIMVASTATIN 20 MG PO TABS: 20.0000 mg | ORAL_TABLET | Freq: Every day | ORAL | Status: DC

## 2012-08-30 MED ORDER — LOSARTAN POTASSIUM-HCTZ 100-25 MG PO TABS: 1.0000 | ORAL_TABLET | Freq: Every day | ORAL | Status: DC

## 2012-08-30 MED ORDER — ALENDRONATE SODIUM 70 MG PO TABS: 70.0000 mg | ORAL_TABLET | ORAL | Status: DC

## 2012-08-30 MED ORDER — CARVEDILOL 12.5 MG PO TABS: 12.5000 mg | ORAL_TABLET | Freq: Two times a day (BID) | ORAL | Status: DC

## 2012-08-30 MED ORDER — METRONIDAZOLE 1 % EX GEL: Freq: Every day | CUTANEOUS | Status: AC

## 2012-08-30 NOTE — Assessment & Plan Note (Addendum)
Bone density test  05-2012, T score -2.4, this is an improvement compared to previous bone density test. Plan is to continue with Fosamax , prescription provided

## 2012-08-30 NOTE — Assessment & Plan Note (Signed)
Skin in the nose has actually improved, continue with topical treatment.

## 2012-08-30 NOTE — Assessment & Plan Note (Addendum)
Good compliance with simvastatin & TriCor, will ask patient to come back for a FLP, AST, ALT. RF meds

## 2012-08-30 NOTE — Patient Instructions (Addendum)
Please come back fasting for blood work on FLP, AST, ALT--- dx hyperlipidemia --- Next visit in 3-4 months for a physical exam

## 2012-08-30 NOTE — Assessment & Plan Note (Signed)
Ambulatory BPs less than 140/70, good medication compliance, refill medications. Last BMP normal

## 2012-08-30 NOTE — Progress Notes (Signed)
  Subjective:    Patient ID: Maurice Colon, male    DOB: [birth year], 79 y.o.   MRN: [id number]  HPI We discussed the following issues today: High cholesterol, good medication compliance with Zocor and TriCor, needs refills. Hypertension, good medication compliance, ambulatory BPs 140/70. Rosacea, needs a refill on metronidazole. He was seen with a rash at the left foot, better with Lotrisone. Had chills 2 days ago, was feeling unwell. Yesterday he had some myalgias, today he feels better.  Past Medical History  Diagnosis Date  . Hyperlipidemia     Hypertriglyceridemia  . Hypertension   . Osteoporosis   . Hypogonadism, male   . DJD (degenerative joint disease)   . Rosacea    Past Surgical History  Procedure Date  . Inguinal hernia repair   . Cataract extraction     Bilaterally    Review of Systems Denies any fever. No nausea, vomiting, diarrhea. Very mild cough and sneezing, no sore throat. No shortness of breath. Life is to continue to be healthy, he remains active.     Objective:   Physical Exam  General -- alert, well-developed  HEENT --  Nose skin seems smooth, seen, no telangiectasias. Tympanic membranes normal, nose slightly congested, throat without redness or discharge. Lungs -- normal respiratory effort, no intercostal retractions, no accessory muscle use, and normal breath sounds.   Heart-- normal rate, regular rhythm, no murmur, and no gallop.   Extremities-- no pretibial edema bilaterally  Psych-- Cognition and judgment appear intact. Alert and cooperative with normal attention span and concentration.  not anxious appearing and not depressed appearing.      Assessment & Plan:   Viral syndrome, see history of present illness, recommend observation.  Patient change insurance company, needs all medications refill. Prescriptions provided.

## 2012-08-31 ENCOUNTER — Other Ambulatory Visit (INDEPENDENT_AMBULATORY_CARE_PROVIDER_SITE_OTHER): Payer: Medicare Other

## 2012-08-31 DIAGNOSIS — E785 Hyperlipidemia, unspecified: Secondary | ICD-10-CM

## 2012-08-31 LAB — ALT: ALT: 23 U/L (ref 0–53)

## 2012-08-31 LAB — LIPID PANEL
Cholesterol: 124 mg/dL (ref 0–200)
HDL: 26.5 mg/dL — ABNORMAL LOW (ref >39.00)
Triglycerides: 232 mg/dL — ABNORMAL HIGH (ref 0.0–149.0)
VLDL: 46.4 mg/dL — ABNORMAL HIGH (ref 0.0–40.0)

## 2012-09-04 ENCOUNTER — Encounter: Payer: Self-pay | Admitting: *Deleted

## 2012-11-19 ENCOUNTER — Telehealth: Payer: Self-pay | Admitting: Internal Medicine

## 2012-11-20 NOTE — Telephone Encounter (Signed)
done

## 2012-11-20 NOTE — Telephone Encounter (Signed)
Not on pt's current med list. OK to refill? 

## 2012-11-23 ENCOUNTER — Telehealth: Payer: Self-pay | Admitting: Internal Medicine

## 2012-11-23 NOTE — Telephone Encounter (Signed)
Patient Information:  Caller Name: Maurice Colon  Phone: [phone]  Patient: Maurice, Colon  Gender: Male  DOB: 03/09/1933  Age: 79 Years  PCP: Willow Ora  Office Follow Up:  Does the office need to follow up with this patient?: Yes  Instructions For The Office: Disp to see today.  Pt wants an appt for 11/24/12, d/t transportation issues.  Please follow up with pt regarding appt.  Thank you   Symptoms  Reason For Call & Symptoms: Back pain that goes into right leg.  No known injury.  Pain medication not working as well. Pt can walk for a short distance.  Pt seen at ED [facility].    Reviewed Health History In EMR: Yes  Reviewed Medications In EMR: Yes  Reviewed Allergies In EMR: Yes  Reviewed Surgeries / Procedures: Yes  Date of Onset of Symptoms: 11/15/2012  Treatments Tried: Prescribed medications.  Treatments Tried Worked: No  Guideline(s) Used:  Leg Pain  Back Pain  Disposition Per Guideline:   See Today in Office  Reason For Disposition Reached:   Can't walk or can barely walk  Advice Given:  Cold or Heat:  Cold Pack: For pain or swelling, use a cold pack or ice wrapped in a wet cloth. Put it on the sore area for 20 minutes. Repeat 4 times on the first day, then as needed.  Heat Pack: If pain lasts over 2 days, apply heat to the sore area. Use a heat pack, heating pad, or warm wet washcloth. Do this for 10 minutes, then as needed. For widespread stiffness, take a hot bath or hot shower instead. Move the sore area under the warm water.  Sleep:  Sleep on your side with a pillow between your knees. If you sleep on your back, put a pillow under your knees.  Pain Medicines:  Use the lowest amount of medicine that makes your pain feel better.  Patient Will Follow Care Advice:  YES

## 2012-11-23 NOTE — Telephone Encounter (Signed)
Attempted call back tol pt, no answer, and unable to leave a message.

## 2012-11-27 ENCOUNTER — Other Ambulatory Visit: Payer: Self-pay | Admitting: *Deleted

## 2012-11-27 MED ORDER — SIMVASTATIN 20 MG PO TABS: 20.0000 mg | ORAL_TABLET | Freq: Every day | ORAL | Status: DC

## 2012-11-27 MED ORDER — ALENDRONATE SODIUM 70 MG PO TABS: 70.0000 mg | ORAL_TABLET | ORAL | Status: AC

## 2012-11-27 MED ORDER — CARVEDILOL 12.5 MG PO TABS: 12.5000 mg | ORAL_TABLET | Freq: Two times a day (BID) | ORAL | Status: DC

## 2012-11-27 NOTE — Telephone Encounter (Signed)
Rx sent 

## 2012-11-28 ENCOUNTER — Telehealth: Payer: Self-pay | Admitting: Internal Medicine

## 2012-11-28 NOTE — Telephone Encounter (Signed)
Pt called about getting an appt with dr Drue Novel but dr Drue Novel is filled up until next Friday. Pt would like dr Drue Novel to call him bc he is having a lot of back pain and he only wants to see dr Drue Novel. thanks

## 2012-11-28 NOTE — Telephone Encounter (Signed)
Advised pt to call the office first thing in the morning & we should be able to get him scheduled for a sameday appt. Pt understood.

## 2012-11-29 ENCOUNTER — Encounter: Payer: Self-pay | Admitting: Internal Medicine

## 2012-11-29 ENCOUNTER — Ambulatory Visit (INDEPENDENT_AMBULATORY_CARE_PROVIDER_SITE_OTHER): Payer: Medicare PPO | Admitting: Internal Medicine

## 2012-11-29 VITALS — BP 116/64 | HR 46 | Temp 98.0°F | Wt 195.0 lb

## 2012-11-29 DIAGNOSIS — E785 Hyperlipidemia, unspecified: Secondary | ICD-10-CM

## 2012-11-29 DIAGNOSIS — I10 Essential (primary) hypertension: Secondary | ICD-10-CM

## 2012-11-29 DIAGNOSIS — M549 Dorsalgia, unspecified: Secondary | ICD-10-CM

## 2012-11-29 MED ORDER — OXYCODONE-ACETAMINOPHEN 5-325 MG PO TABS: 1.0000 | ORAL_TABLET | Freq: Three times a day (TID) | ORAL | Status: DC | PRN

## 2012-11-29 MED ORDER — CARVEDILOL 12.5 MG PO TABS: 12.5000 mg | ORAL_TABLET | Freq: Two times a day (BID) | ORAL | Status: DC

## 2012-11-29 MED ORDER — FENOFIBRATE 145 MG PO TABS: 145.0000 mg | ORAL_TABLET | Freq: Every day | ORAL | Status: DC

## 2012-11-29 MED ORDER — LOSARTAN POTASSIUM-HCTZ 100-25 MG PO TABS: 1.0000 | ORAL_TABLET | Freq: Every day | ORAL | Status: DC

## 2012-11-29 NOTE — Assessment & Plan Note (Signed)
  Acute back pain with radiculopathy symptoms. Plan: Continue midfield prednisone, oxycodone and Mobic as needed. Arrange for orthopedic visit in [place]. Get a release of information And obtain MRI reports

## 2012-11-29 NOTE — Assessment & Plan Note (Signed)
BP excellent today, refill 90 and 1

## 2012-11-29 NOTE — Patient Instructions (Addendum)
Please sign a release of information and get the MRI of Hospital reports from the ER in [facility] Arrange a routine office visit at your earliest convenience.

## 2012-11-29 NOTE — Progress Notes (Signed)
  Subjective:    Patient ID: Maurice Colon, male    DOB: Oct 23, 1932, 79 y.o.   MRN: [id number]  HPI Acute visit Symptoms started a week ago with a ill-defined back pain, right-sided mostly. Pain gradually increase, went to the ER once with above symptoms, x-ray were done, was rx prednisone and hydrocodone. Went to the ER a second time in [facility], MRI was done, he was diagnosed with a sciatic problem in, was switch methylprednisolone , oxycodone and referred to ortho. And this point, back pain is better but he is still has a pain in the lateral and posterior aspect of the right leg, the pain goes down to the ankle. Oxycodone 5/325 4 times a day helps better than hydrocodone.  Past Medical History  Diagnosis Date  . Hyperlipidemia     Hypertriglyceridemia  . Hypertension   . Osteoporosis   . Hypogonadism, male   . DJD (degenerative joint disease)   . Rosacea    Past Surgical History  Procedure Laterality Date  . Inguinal hernia repair    . Cataract extraction      Bilaterally    Review of Systems No fever or chills No injury or rash No bladder or bowel incontinence.     Objective:   Physical Exam General -- alert, well-developed, + Antalgic gait.   Abdomen--soft, non-tender, no distention, no masses, no HSM, no guarding, and no rigidity.   Back, not tender to palpation Extremities-- no pretibial edema bilaterally Neurologic-- alert & oriented X3 , DTRs and strength normal in all extremities. +/- straight leg test on the R Psych-- Cognition and judgment appear intact. Alert and cooperative with normal attention span and concentration.  not anxious appearing and not depressed appearing.        Assessment & Plan:

## 2012-11-29 NOTE — Assessment & Plan Note (Signed)
Needs a refill on TriCor, 90 and 1

## 2012-12-11 ENCOUNTER — Telehealth: Payer: Self-pay | Admitting: Internal Medicine

## 2012-12-11 MED ORDER — FENOFIBRATE 145 MG PO TABS: 145.0000 mg | ORAL_TABLET | Freq: Every day | ORAL | Status: DC

## 2012-12-11 MED ORDER — LOSARTAN POTASSIUM-HCTZ 100-25 MG PO TABS: 1.0000 | ORAL_TABLET | Freq: Every day | ORAL | Status: DC

## 2012-12-11 NOTE — Telephone Encounter (Signed)
Patient's son called stating the patient wanted his lisinopril and fenofibrate sent to [facility], not [facility]. They are requesting that we change this and send to [facility].

## 2012-12-11 NOTE — Telephone Encounter (Signed)
Refill done.  

## 2013-03-15 ENCOUNTER — Encounter: Payer: Self-pay | Admitting: Lab

## 2013-03-16 ENCOUNTER — Ambulatory Visit (INDEPENDENT_AMBULATORY_CARE_PROVIDER_SITE_OTHER): Payer: Medicare PPO | Admitting: Internal Medicine

## 2013-03-16 ENCOUNTER — Encounter: Payer: Self-pay | Admitting: Internal Medicine

## 2013-03-16 VITALS — BP 140/70 | HR 43 | Temp 97.9°F | Wt 198.4 lb

## 2013-03-16 DIAGNOSIS — I10 Essential (primary) hypertension: Secondary | ICD-10-CM

## 2013-03-16 DIAGNOSIS — R7309 Other abnormal glucose: Secondary | ICD-10-CM

## 2013-03-16 DIAGNOSIS — R739 Hyperglycemia, unspecified: Secondary | ICD-10-CM

## 2013-03-16 DIAGNOSIS — E785 Hyperlipidemia, unspecified: Secondary | ICD-10-CM

## 2013-03-16 DIAGNOSIS — M549 Dorsalgia, unspecified: Secondary | ICD-10-CM

## 2013-03-16 LAB — HEMOGLOBIN A1C: Hgb A1c MFr Bld: 5.9 % (ref 4.6–6.5)

## 2013-03-16 LAB — CBC WITH DIFFERENTIAL/PLATELET
Basophils Relative: 0.5 % (ref 0.0–3.0)
Eosinophils Absolute: 0.1 10*3/uL (ref 0.0–0.7)
Hemoglobin: 13.3 g/dL (ref 13.0–17.0)
Lymphocytes Relative: 39.1 % (ref 12.0–46.0)
MCHC: 34 g/dL (ref 30.0–36.0)
Monocytes Relative: 10 % (ref 3.0–12.0)
Neutro Abs: 3 10*3/uL (ref 1.4–7.7)
RBC: 4.33 Mil/uL (ref 4.22–5.81)

## 2013-03-16 LAB — BASIC METABOLIC PANEL
Chloride: 105 mEq/L (ref 96–112)
GFR: 64.1 mL/min (ref >60.00)
Glucose, Bld: 84 mg/dL (ref 70–99)
Potassium: 3.6 mEq/L (ref 3.5–5.1)
Sodium: 138 mEq/L (ref 135–145)

## 2013-03-16 MED ORDER — MELOXICAM 7.5 MG PO TABS: 7.5000 mg | ORAL_TABLET | Freq: Every day | ORAL | Status: DC | PRN

## 2013-03-16 NOTE — Patient Instructions (Addendum)
[place] Please obtain a release of information and fax it tothe hospital in [place], we need to get a copy of the MRI of the back. ---- Get your blood work before you leave  Next visit in for a physical exam  Please make an appointment before you leave the office today (or call few weeks in advance) --- Decrease carvedilol to half tablet twice a day, please take your BP and pulse daily, write it down and bring readings to the office  in 2 or 3 weeks.

## 2013-03-16 NOTE — Assessment & Plan Note (Signed)
See previous entry. Did not  see orthopedic surgery, after several weeks of rest, he is feeling much better. Plan: Meloxicam as needed, discontinue OxyContin, will try to obtain the MRI report

## 2013-03-16 NOTE — Assessment & Plan Note (Addendum)
Relatively well controlled w/ dual therapy, TriCor is expensive, patient will call with an alternative. Check LFTs

## 2013-03-16 NOTE — Progress Notes (Signed)
  Subjective:    Patient ID: Maurice Colon, male    DOB: 04-Dec-1932, 79 y.o.   MRN: [id number]  HPI Routine office visit Back pain, was seen with severe back pain few weeks ago, refer to orthopedic surgery, he never got to see the doctor. He rested for several weeks and now is much improved, returned to work Not taking OxyContin, + meloxicam as needed which he tolerates well. Hypertension, good medication compliance, ambulatory BP is excellent at 130-140/80. Pulse today is low. See assessment and plan High cholesterol, good medication compliance, TriCor is very expensive.  Past Medical History  Diagnosis Date  . Hyperlipidemia     Hypertriglyceridemia  . Hypertension   . Osteoporosis   . Hypogonadism, male   . DJD (degenerative joint disease)   . Rosacea    Past Surgical History  Procedure Laterality Date  . Inguinal hernia repair    . Cataract extraction      Bilaterally   History   Social History  . Marital Status: Married    Spouse Name: N/A    Number of Children: N/A  . Years of Education: N/A   Occupational History  . PT job     Social History Main Topics  . Smoking status: Never Smoker   . Smokeless tobacco: Not on file  . Alcohol Use: Yes     Comment: wine, occasionally  . Drug Use: Not on file  . Sexual Activity: Not on file   Other Topics Concern  . Not on file   Social History Narrative   Original from [place]   Lives w/ wife and 1 daughter   [place] Ham to [place] early 2008           Review of Systems Denies chest pain or shortness or breath. No lower extremity edema. No dizziness specifically no orthostatic dizziness. Denies, pain, nausea or change in the color of the stools.     Objective:   Physical Exam General -- alert, well-developed, NAD.  Neck --no thyromegaly Lungs -- normal respiratory effort, no intercostal retractions, no accessory muscle use, and normal breath sounds.  Heart-- brady, rate ~ 40, no murmur  Abdomen-- Not distended,  Good bowel sounds,soft, non-tender.  Extremities-- no pretibial edema bilaterally  Neurologic-- alert & oriented X3. Speech, gait normal. DTRs symmetric (decreased at B ankle) strength normal in all extremities.  Psych-- Cognition and judgment appear intact. Alert and cooperative with normal attention span and concentration. not anxious appearing and not depressed appearing.      Assessment & Plan:

## 2013-03-16 NOTE — Assessment & Plan Note (Addendum)
BPs well-controlled, he was noted to be bradycardic but asymptomatic. On chart review he has been bradycardic before but his heart rate has not been as low as today. EKG showed a heart rate of 40, compared to previous EKG there is no   Changes. Plan: Decrease carvedilol to half tablet twice a day, Monitor her BP and pulse, asked patient to bring log in 3 weeks

## 2013-03-17 ENCOUNTER — Encounter: Payer: Self-pay | Admitting: Internal Medicine

## 2013-03-19 ENCOUNTER — Encounter: Payer: Self-pay | Admitting: *Deleted

## 2013-04-16 ENCOUNTER — Encounter: Payer: Self-pay | Admitting: Internal Medicine

## 2013-06-15 ENCOUNTER — Telehealth: Payer: Self-pay

## 2013-06-15 NOTE — Telephone Encounter (Addendum)
Incorrect contact information  tdap--05/2010 CCS--07/2010--unable to locate report Pneumonia vaccine--05/2007 PSA--11/2011--1.12 Bone Density--06/2012

## 2013-06-18 ENCOUNTER — Encounter: Payer: Self-pay | Admitting: Internal Medicine

## 2013-06-18 ENCOUNTER — Encounter: Payer: Medicare PPO | Admitting: Internal Medicine

## 2013-06-18 DIAGNOSIS — Z0289 Encounter for other administrative examinations: Secondary | ICD-10-CM

## 2013-06-27 ENCOUNTER — Other Ambulatory Visit: Payer: Self-pay | Admitting: *Deleted

## 2013-06-27 MED ORDER — CARVEDILOL 12.5 MG PO TABS: 6.2500 mg | ORAL_TABLET | Freq: Two times a day (BID) | ORAL | Status: DC

## 2013-06-27 MED ORDER — LOSARTAN POTASSIUM-HCTZ 100-25 MG PO TABS: 1.0000 | ORAL_TABLET | Freq: Every day | ORAL | Status: DC

## 2013-06-27 NOTE — Telephone Encounter (Signed)
Carvedilol refilled per protocol 

## 2013-06-27 NOTE — Telephone Encounter (Signed)
Losartan-HCTZ refilled per protocol 

## 2013-07-03 ENCOUNTER — Other Ambulatory Visit: Payer: Self-pay | Admitting: *Deleted

## 2013-07-03 MED ORDER — CARVEDILOL 12.5 MG PO TABS: 6.2500 mg | ORAL_TABLET | Freq: Two times a day (BID) | ORAL | Status: DC

## 2013-07-04 ENCOUNTER — Other Ambulatory Visit: Payer: Self-pay | Admitting: *Deleted

## 2013-07-14 NOTE — Progress Notes (Signed)
This encounter was created in error - please disregard.

## 2013-07-16 ENCOUNTER — Ambulatory Visit (INDEPENDENT_AMBULATORY_CARE_PROVIDER_SITE_OTHER): Payer: Medicare PPO | Admitting: Internal Medicine

## 2013-07-16 ENCOUNTER — Encounter: Payer: Self-pay | Admitting: Internal Medicine

## 2013-07-16 VITALS — BP 116/70 | HR 80 | Temp 98.6°F | Wt 199.0 lb

## 2013-07-16 DIAGNOSIS — R7303 Prediabetes: Secondary | ICD-10-CM

## 2013-07-16 DIAGNOSIS — Z23 Encounter for immunization: Secondary | ICD-10-CM

## 2013-07-16 DIAGNOSIS — I10 Essential (primary) hypertension: Secondary | ICD-10-CM

## 2013-07-16 DIAGNOSIS — R7309 Other abnormal glucose: Secondary | ICD-10-CM

## 2013-07-16 DIAGNOSIS — E785 Hyperlipidemia, unspecified: Secondary | ICD-10-CM

## 2013-07-16 DIAGNOSIS — E119 Type 2 diabetes mellitus without complications: Secondary | ICD-10-CM | POA: Insufficient documentation

## 2013-07-16 HISTORY — DX: Prediabetes: R73.03

## 2013-07-16 MED ORDER — SIMVASTATIN 20 MG PO TABS: 20.0000 mg | ORAL_TABLET | Freq: Every day | ORAL | Status: DC

## 2013-07-16 MED ORDER — FENOFIBRATE 145 MG PO TABS: 145.0000 mg | ORAL_TABLET | Freq: Every day | ORAL | Status: DC

## 2013-07-16 MED ORDER — MELOXICAM 7.5 MG PO TABS: 7.5000 mg | ORAL_TABLET | Freq: Every day | ORAL | Status: DC | PRN

## 2013-07-16 NOTE — Progress Notes (Signed)
Pre visit review using our clinic review tool, if applicable. No additional management support is needed unless otherwise documented below in the visit note. 

## 2013-07-16 NOTE — Progress Notes (Signed)
   Subjective:    Patient ID: Maurice Colon, male    DOB: 03-22-1933, 79 y.o.   MRN: [id number]  HPI Routine office visit, we discussed the following: Needs a flu shot Hypertension, good medication compliance, ambulatory BPs within normal High cholesterol--good compliance with fenofibrate, needs a refill. Recently diagnosed with prediabetes, would like learn more about that.  Past Medical History  Diagnosis Date  . Hyperlipidemia     Hypertriglyceridemia  . Hypertension   . Osteoporosis   . Hypogonadism, male   . DJD (degenerative joint disease)   . Rosacea    Past Surgical History  Procedure Laterality Date  . Inguinal hernia repair    . Cataract extraction      Bilaterally     Review of Systems Denies chest pain or shortness or breath Denies nausea, vomiting, diarrhea    Objective:   Physical Exam BP 116/70  Pulse 80  Temp(Src) 98.6 F (37 C)  Wt 199 lb (90.266 kg)  SpO2 97% General -- alert, well-developed, NAD.   Lungs -- normal respiratory effort, no intercostal retractions, no accessory muscle use, and normal breath sounds.  Heart-- normal rate, regular rhythm, no murmur.   Extremities-- no pretibial edema bilaterally  Neurologic--  alert & oriented X3. Speech normal, gait normal, strength normal in all extremities.  Psych-- Cognition and judgment appear intact. Cooperative with normal attention span and concentration. No anxious appearing , no depressed appearing.     Assessment & Plan:

## 2013-07-16 NOTE — Assessment & Plan Note (Signed)
A few months ago we decrease beta blocker dose d/t brady, BP continued to be good, ambulatory pulse is 59-60s Plan-- no change

## 2013-07-16 NOTE — Assessment & Plan Note (Signed)
Refills fenofibrate  , good compliance and tolerance.

## 2013-07-16 NOTE — Patient Instructions (Addendum)
   Next visit for a physical exam follow up (30 minutes)  regards cholesterol , blood sugar,  Hypertension, fasting, in 3 months  Please make an appointment

## 2013-07-16 NOTE — Assessment & Plan Note (Signed)
Recent A1c 5.9, concept of prediabetes discussed. Counseled about diet and exercise, recheck A1c in few months

## 2013-08-06 ENCOUNTER — Telehealth: Payer: Self-pay | Admitting: *Deleted

## 2013-08-06 NOTE — Telephone Encounter (Signed)
Patient daughter called for her dad and stated if we could call something else in besides fenofibrate (TRICOR) 145 MG tablet because its to expensive.

## 2013-08-07 MED ORDER — FENOFIBRATE 160 MG PO TABS: 160.0000 mg | ORAL_TABLET | Freq: Every day | ORAL | Status: DC

## 2013-08-07 NOTE — Telephone Encounter (Signed)
There is a generic fenofibrate, 160 mg one by mouth daily, #90 and one refill. That would be the best alternative

## 2013-08-07 NOTE — Addendum Note (Signed)
Addended by: Eustace QuailEABOLD, Giovanne Nickolson J on: 08/07/2013 11:38 AM   Modules accepted: Orders

## 2013-10-16 ENCOUNTER — Telehealth: Payer: Self-pay | Admitting: Internal Medicine

## 2013-10-16 MED ORDER — FENOFIBRATE 160 MG PO TABS: 160.0000 mg | ORAL_TABLET | Freq: Every day | ORAL | Status: DC

## 2013-10-16 MED ORDER — LOSARTAN POTASSIUM-HCTZ 100-25 MG PO TABS: 1.0000 | ORAL_TABLET | Freq: Every day | ORAL | Status: DC

## 2013-10-16 NOTE — Telephone Encounter (Signed)
rx sent

## 2013-10-16 NOTE — Telephone Encounter (Signed)
Patient called and requested a refill for losartan-hydrochlorothiazide (HYZAAR) 100-25 MG per tablet and fenofibrate 160 MG tablet and also if we could send it to his local pharmacy bc her is out of medication. Patient scheduled an apt for March 31st.   Pharmacy [facility]

## 2013-10-30 ENCOUNTER — Encounter: Payer: Self-pay | Admitting: Internal Medicine

## 2013-10-30 ENCOUNTER — Ambulatory Visit (INDEPENDENT_AMBULATORY_CARE_PROVIDER_SITE_OTHER): Payer: Medicare PPO | Admitting: Internal Medicine

## 2013-10-30 VITALS — BP 112/63 | HR 60 | Temp 97.7°F | Wt 203.0 lb

## 2013-10-30 DIAGNOSIS — E291 Testicular hypofunction: Secondary | ICD-10-CM

## 2013-10-30 DIAGNOSIS — E785 Hyperlipidemia, unspecified: Secondary | ICD-10-CM

## 2013-10-30 DIAGNOSIS — I10 Essential (primary) hypertension: Secondary | ICD-10-CM

## 2013-10-30 DIAGNOSIS — E559 Vitamin D deficiency, unspecified: Secondary | ICD-10-CM

## 2013-10-30 DIAGNOSIS — R7309 Other abnormal glucose: Secondary | ICD-10-CM

## 2013-10-30 DIAGNOSIS — R7303 Prediabetes: Secondary | ICD-10-CM

## 2013-10-30 DIAGNOSIS — M810 Age-related osteoporosis without current pathological fracture: Secondary | ICD-10-CM

## 2013-10-30 LAB — VITAMIN D 25 HYDROXY (VIT D DEFICIENCY, FRACTURES): VIT D 25 HYDROXY: 25 ng/mL — AB (ref 30–89)

## 2013-10-30 NOTE — Progress Notes (Signed)
Subjective:    Patient ID: Maurice Colon, male    DOB: [birth year], 80 y.o.   MRN: [id number]  DOS:  10/30/2013 Type of  visit: ROV pre diabetes, labs reviewed, due for a A1c. Needs to improve lifestyle Hypertension, good medication compliance, pulse in the 60s to 70s. Osteoporosis, low vitamin D--not on any supplements, due for labs. 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  ROS Diet-- regular  Exercise-- active at work  No  CP, SOB No palpitations, no lower extremity edema Denies  nausea, vomiting diarrhea Denies  blood in the stools No dysuria, gross hematuria, difficulty urinating      Past Medical History  Diagnosis Date  . Hyperlipidemia     Hypertriglyceridemia  . Hypertension   . Osteoporosis   . Hypogonadism, male   . DJD (degenerative joint disease)   . Rosacea   . Prediabetes 07/16/2013    Past Surgical History  Procedure Laterality Date  . Inguinal hernia repair    . Cataract extraction      Bilaterally    History   Social History  . Marital Status: Married    Spouse Name: N/A    Number of Children: 2  . Years of Education: N/A   Occupational History  . retired, working again    .     Social History Main Topics  . Smoking status: Never Smoker   . Smokeless tobacco: Never Used  . Alcohol Use: No     Comment: wine, occasionally  . Drug Use: Not on file  . Sexual Activity: Not on file   Other Topics Concern  . Not on file   Social History Narrative   [place]   Lives w/ wife and 1 daughter   [place]              Medication List       This list is accurate as of: 10/30/13  5:44 PM.  Always use your most recent med list.               alendronate 70 MG tablet  Commonly known as:  FOSAMAX  Take 1 tablet (70 mg total) by mouth every 7 (seven) days. Take with a full glass of water on an empty stomach.     carvedilol 12.5 MG tablet    Commonly known as:  COREG  Take 0.5 tablets (6.25 mg total) by mouth 2 (two) times daily with a meal.     fenofibrate 160 MG tablet  Take 1 tablet (160 mg total) by mouth daily.     losartan-hydrochlorothiazide 100-25 MG per tablet  Commonly known as:  HYZAAR  Take 1 tablet by mouth daily.     meloxicam 7.5 MG tablet  Commonly known as:  MOBIC  Take 1-2 tablets (7.5-15 mg total) by mouth daily as needed.     simvastatin 20 MG tablet  Commonly known as:  ZOCOR  Take 1 tablet (20 mg total) by mouth at bedtime.           Objective:   Physical Exam BP 112/63  Pulse 60  Temp(Src) 97.7 F (36.5 C)  Wt 203 lb (92.08 kg)  SpO2 95% General -- alert, well-developed, NAD.  Lungs -- normal respiratory effort, no intercostal retractions, no accessory muscle use, and normal breath sounds.  Heart-- normal rate, regular rhythm, no murmur.  Extremities-- no pretibial edema bilaterally  Neurologic--  alert & oriented X3. Speech normal, gait normal, strength normal in all extremities.  Psych-- Cognition and judgment appear intact. Cooperative with normal attention span and concentration. No anxious or depressed appearing.        Assessment & Plan:

## 2013-10-30 NOTE — Assessment & Plan Note (Signed)
Check the A1c, follow a healthier diet and more exercise is difficult for the patient

## 2013-10-30 NOTE — Patient Instructions (Signed)
Get your blood work before you leave   Calcium 1 g and vitamin D 600 units 1000 units every day  Next visit is for a physical exam in 3 -4 months , fasting Please make an appointment

## 2013-10-30 NOTE — Assessment & Plan Note (Signed)
Good compliance with medications, not fasting today, check labs  in 3 months

## 2013-10-30 NOTE — Assessment & Plan Note (Signed)
On no hormones, feels well

## 2013-10-30 NOTE — Assessment & Plan Note (Signed)
Well-controlled, no change, check a BMP 

## 2013-10-30 NOTE — Assessment & Plan Note (Signed)
Good compliance with Fosamax, history of vitamin D, on no supplements Recommend calcium and vitamin D daily Check a vitamin D level

## 2013-10-31 ENCOUNTER — Telehealth: Payer: Self-pay | Admitting: Internal Medicine

## 2013-10-31 LAB — BASIC METABOLIC PANEL
BUN: 20 mg/dL (ref 6–23)
CO2: 26 mEq/L (ref 19–32)
Calcium: 9.3 mg/dL (ref 8.4–10.5)
Chloride: 102 mEq/L (ref 96–112)
Creatinine, Ser: 1.1 mg/dL (ref 0.4–1.5)
GFR: 70.19 mL/min (ref >60.00)
Glucose, Bld: 160 mg/dL — ABNORMAL HIGH (ref 70–99)
Potassium: 3.5 mEq/L (ref 3.5–5.1)
Sodium: 136 mEq/L (ref 135–145)

## 2013-10-31 LAB — HEMOGLOBIN A1C: Hgb A1c MFr Bld: 6.2 % (ref 4.6–6.5)

## 2013-10-31 NOTE — Telephone Encounter (Signed)
Relevant patient education mailed to patient.  

## 2013-11-05 ENCOUNTER — Encounter: Payer: Self-pay | Admitting: *Deleted

## 2013-11-05 MED ORDER — VITAMIN D (ERGOCALCIFEROL) 1.25 MG (50000 UNIT) PO CAPS: 50000.0000 [IU] | ORAL_CAPSULE | ORAL | Status: DC

## 2013-11-05 NOTE — Addendum Note (Signed)
Addended by: Eustace QuailEABOLD, Lonnette Shrode J on: 11/05/2013 08:48 AM   Modules accepted: Orders

## 2013-11-22 ENCOUNTER — Telehealth: Payer: Self-pay | Admitting: Internal Medicine

## 2013-11-22 NOTE — Telephone Encounter (Signed)
Patient called and requested a refill for his blood pressure medication and cholesterol meds. Patient did not know the names of them.  [facility] [address] - [place], [place] - [address]

## 2013-11-23 MED ORDER — FENOFIBRATE 160 MG PO TABS: 160.0000 mg | ORAL_TABLET | Freq: Every day | ORAL | Status: DC

## 2013-11-23 MED ORDER — LOSARTAN POTASSIUM-HCTZ 100-25 MG PO TABS: 1.0000 | ORAL_TABLET | Freq: Every day | ORAL | Status: DC

## 2013-11-23 NOTE — Telephone Encounter (Signed)
Done

## 2014-01-08 ENCOUNTER — Telehealth: Payer: Self-pay | Admitting: *Deleted

## 2014-01-08 ENCOUNTER — Other Ambulatory Visit: Payer: Self-pay | Admitting: *Deleted

## 2014-01-08 DIAGNOSIS — M25579 Pain in unspecified ankle and joints of unspecified foot: Secondary | ICD-10-CM

## 2014-01-08 DIAGNOSIS — M549 Dorsalgia, unspecified: Secondary | ICD-10-CM

## 2014-01-08 MED ORDER — MELOXICAM 7.5 MG PO TABS: 7.5000 mg | ORAL_TABLET | Freq: Every day | ORAL | Status: DC | PRN

## 2014-01-08 NOTE — Telephone Encounter (Signed)
Requesting Meloxicam 7.5mg -Take 1 to 2 tablets every day as needed. Last refill:07-16-13;#90,1 Last OV:10-30-13 Please advise.//AB/CMA

## 2014-01-08 NOTE — Telephone Encounter (Signed)
Error//AB/CMA 

## 2014-01-08 NOTE — Telephone Encounter (Signed)
Ok 90 and 1 RF 

## 2014-01-08 NOTE — Telephone Encounter (Signed)
Refill for #90 of meloxicam sent to [facility] in [place]

## 2014-03-29 ENCOUNTER — Other Ambulatory Visit: Payer: Self-pay

## 2014-03-29 MED ORDER — LOSARTAN POTASSIUM-HCTZ 100-25 MG PO TABS: 1.0000 | ORAL_TABLET | Freq: Every day | ORAL | Status: DC

## 2014-05-01 ENCOUNTER — Encounter: Payer: Self-pay | Admitting: Internal Medicine

## 2014-05-01 ENCOUNTER — Ambulatory Visit (INDEPENDENT_AMBULATORY_CARE_PROVIDER_SITE_OTHER): Payer: Medicare PPO | Admitting: Internal Medicine

## 2014-05-01 ENCOUNTER — Telehealth: Payer: Self-pay | Admitting: Internal Medicine

## 2014-05-01 VITALS — BP 128/82 | HR 66 | Temp 98.2°F | Wt 203.5 lb

## 2014-05-01 DIAGNOSIS — R7309 Other abnormal glucose: Secondary | ICD-10-CM

## 2014-05-01 DIAGNOSIS — Z23 Encounter for immunization: Secondary | ICD-10-CM

## 2014-05-01 DIAGNOSIS — R7303 Prediabetes: Secondary | ICD-10-CM

## 2014-05-01 DIAGNOSIS — E785 Hyperlipidemia, unspecified: Secondary | ICD-10-CM

## 2014-05-01 DIAGNOSIS — M810 Age-related osteoporosis without current pathological fracture: Secondary | ICD-10-CM

## 2014-05-01 DIAGNOSIS — I10 Essential (primary) hypertension: Secondary | ICD-10-CM

## 2014-05-01 MED ORDER — ATORVASTATIN CALCIUM 10 MG PO TABS: 10.0000 mg | ORAL_TABLET | Freq: Every day | ORAL | Status: DC

## 2014-05-01 MED ORDER — FENOFIBRATE 160 MG PO TABS: 160.0000 mg | ORAL_TABLET | Freq: Every day | ORAL | Status: DC

## 2014-05-01 NOTE — Assessment & Plan Note (Addendum)
Has seen with simvastatin for a while and TriCor. Reports that he is not feeling well when he takes simvastatin and when he  discontinued it he feels better. Plan:  Continue with fenofibrate, will send a RF Switch to atorvastatin 10 mg Follow up fast in 6 weeks for a physical

## 2014-05-01 NOTE — Progress Notes (Signed)
Pre visit review using our clinic review tool, if applicable. No additional management support is needed unless otherwise documented below in the visit note. 

## 2014-05-01 NOTE — Assessment & Plan Note (Signed)
Diet and exercise discussed

## 2014-05-01 NOTE — Progress Notes (Signed)
Subjective:    Patient ID: Maurice Colon, male    DOB: [birth year], 80 y.o.   MRN: [id number]  DOS:  05/01/2014 Type of visit - description : ROV Interval history: DJD, meloxicam as needed, no apparent side effects High cholesterol, not taking simvastatin regularly, reports that whenever he takes it he gets dry mouth, can't sleep well and has a mild headache. Hypertension, good medication compliance, ambulatory BPs within normal. Diabetes, he remains active, his diet is about the same as before.   ROS States he feels great, continue working. No chest pain or difficulty breathing No nausea, vomiting, diarrhea.   Past Medical History  Diagnosis Date  . Hyperlipidemia     Hypertriglyceridemia  . Hypertension   . Osteoporosis   . Hypogonadism, male   . DJD (degenerative joint disease)   . Rosacea   . Prediabetes 07/16/2013    Past Surgical History  Procedure Laterality Date  . Inguinal hernia repair    . Cataract extraction      Bilaterally    History   Social History  . Marital Status: Married    Spouse Name: N/A    Number of Children: 2  . Years of Education: N/A   Occupational History  . retired, working again    .     Social History Main Topics  . Smoking status: Never Smoker   . Smokeless tobacco: Never Used  . Alcohol Use: No     Comment: wine, occasionally  . Drug Use: Not on file  . Sexual Activity: Not on file   Other Topics Concern  . Not on file   Social History Narrative   [place]   Lives w/ wife and 1 daughter   [place]              Medication List       This list is accurate as of: 05/01/14  4:59 PM.  Always use your most recent med list.               atorvastatin 10 MG tablet  Commonly known as:  LIPITOR  Take 1 tablet (10 mg total) by mouth daily.     carvedilol 12.5 MG tablet  Commonly known as:  COREG  Take 0.5 tablets (6.25 mg total) by mouth 2 (two) times daily with a meal.      fenofibrate 160 MG tablet  Take 1 tablet (160 mg total) by mouth daily.     losartan-hydrochlorothiazide 100-25 MG per tablet  Commonly known as:  HYZAAR  Take 1 tablet by mouth daily.     meloxicam 7.5 MG tablet  Commonly known as:  MOBIC  Take 1-2 tablets (7.5-15 mg total) by mouth daily as needed.           Objective:   Physical Exam BP 128/82  Pulse 66  Temp(Src) 98.2 F (36.8 C) (Oral)  Wt 203 lb 8 oz (92.307 kg)  SpO2 97% General -- alert, well-developed, NAD.   HEENT-- Not pale.  Lungs -- normal respiratory effort, no intercostal retractions, no accessory muscle use, and normal breath sounds.  Heart-- normal rate, regular rhythm, no murmur.   Extremities-- no pretibial edema bilaterally  Neurologic--  alert & oriented X3. Speech normal, gait appropriate for age, strength symmetric and appropriate for age.  Psych-- Cognition and judgment appear intact. Cooperative with normal attention span and concentration. No anxious or depressed appearing.       Assessment &  Plan:

## 2014-05-01 NOTE — Assessment & Plan Note (Signed)
BP well-controlled, check a CMP

## 2014-05-01 NOTE — Telephone Encounter (Signed)
Caller name: Ricki Rodriguezadriana Relation to pt: daughter in law  Call back number: [phone] [facility] [place]  Reason for call:   Patient states that the pharmacy did not receive meloxicam refill

## 2014-05-01 NOTE — Assessment & Plan Note (Signed)
Status post ergocalciferol, currently on OTC vitamin D supplements

## 2014-05-01 NOTE — Patient Instructions (Signed)
Get your blood work before you leave   Stop Simvastatin, start atorvastatin  Get meloxicam from [facility] [place]   Please come back to the office -in 6 weeks  for a physical exam. Come back fasting    Stop by the front desk and schedule the visit

## 2014-05-02 ENCOUNTER — Other Ambulatory Visit: Payer: Self-pay

## 2014-05-02 LAB — COMPREHENSIVE METABOLIC PANEL
ALBUMIN: 3.9 g/dL (ref 3.5–5.2)
ALT: 21 U/L (ref 0–53)
AST: 32 U/L (ref 0–37)
Alkaline Phosphatase: 48 U/L (ref 39–117)
BUN: 13 mg/dL (ref 6–23)
CALCIUM: 9 mg/dL (ref 8.4–10.5)
CO2: 26 mEq/L (ref 19–32)
CREATININE: 1 mg/dL (ref 0.4–1.5)
Chloride: 107 mEq/L (ref 96–112)
GFR: 76.61 mL/min (ref >60.00)
GLUCOSE: 156 mg/dL — AB (ref 70–99)
POTASSIUM: 3.8 meq/L (ref 3.5–5.1)
Sodium: 138 mEq/L (ref 135–145)
Total Bilirubin: 0.8 mg/dL (ref 0.2–1.2)
Total Protein: 7.4 g/dL (ref 6.0–8.3)

## 2014-05-02 LAB — HEMOGLOBIN A1C: Hgb A1c MFr Bld: 6.4 % (ref 4.6–6.5)

## 2014-05-02 MED ORDER — MELOXICAM 7.5 MG PO TABS: 7.5000 mg | ORAL_TABLET | Freq: Every day | ORAL | Status: DC | PRN

## 2014-05-02 NOTE — Telephone Encounter (Signed)
Medication sent to Pharmacy.  

## 2014-05-09 ENCOUNTER — Telehealth: Payer: Self-pay | Admitting: Internal Medicine

## 2014-05-09 NOTE — Telephone Encounter (Signed)
Caller name: Andrenna  Relation to pt: other  Call back number: [phone] [facility] [phone]   Reason for call: pt requesting a refill [phone]

## 2014-05-09 NOTE — Telephone Encounter (Signed)
What medication is he requesting a refill on?

## 2014-05-09 NOTE — Telephone Encounter (Signed)
losartan-hydrochlorothiazide (HYZAAR) 100-25 MG per tablet

## 2014-06-11 ENCOUNTER — Telehealth: Payer: Self-pay | Admitting: *Deleted

## 2014-06-11 ENCOUNTER — Encounter: Payer: Medicare PPO | Admitting: Internal Medicine

## 2014-06-11 NOTE — Telephone Encounter (Signed)
Please reschedule

## 2014-06-11 NOTE — Telephone Encounter (Signed)
Pt did not show for appointment 06/11/2014 at 1:30pm for CPE

## 2014-06-18 NOTE — Telephone Encounter (Signed)
Pt has rescheduled appointment for CPE on 09/19/2014

## 2014-07-02 ENCOUNTER — Other Ambulatory Visit: Payer: Self-pay

## 2014-09-19 ENCOUNTER — Encounter: Payer: Self-pay | Admitting: Internal Medicine

## 2014-09-19 ENCOUNTER — Ambulatory Visit (INDEPENDENT_AMBULATORY_CARE_PROVIDER_SITE_OTHER): Payer: Commercial Managed Care - HMO | Admitting: Internal Medicine

## 2014-09-19 VITALS — BP 177/73 | HR 58 | Temp 97.7°F | Ht 67.0 in | Wt 203.1 lb

## 2014-09-19 DIAGNOSIS — E785 Hyperlipidemia, unspecified: Secondary | ICD-10-CM

## 2014-09-19 DIAGNOSIS — R7309 Other abnormal glucose: Secondary | ICD-10-CM

## 2014-09-19 DIAGNOSIS — Z0000 Encounter for general adult medical examination without abnormal findings: Secondary | ICD-10-CM

## 2014-09-19 DIAGNOSIS — M810 Age-related osteoporosis without current pathological fracture: Secondary | ICD-10-CM

## 2014-09-19 DIAGNOSIS — Z23 Encounter for immunization: Secondary | ICD-10-CM

## 2014-09-19 DIAGNOSIS — Z125 Encounter for screening for malignant neoplasm of prostate: Secondary | ICD-10-CM

## 2014-09-19 DIAGNOSIS — I10 Essential (primary) hypertension: Secondary | ICD-10-CM

## 2014-09-19 DIAGNOSIS — R7303 Prediabetes: Secondary | ICD-10-CM

## 2014-09-19 LAB — BASIC METABOLIC PANEL
BUN: 19 mg/dL (ref 6–23)
CALCIUM: 9.9 mg/dL (ref 8.4–10.5)
CO2: 27 mEq/L (ref 19–32)
CREATININE: 1.11 mg/dL (ref 0.40–1.50)
Chloride: 104 mEq/L (ref 96–112)
GFR: 67.85 mL/min (ref >60.00)
GLUCOSE: 100 mg/dL — AB (ref 70–99)
Potassium: 3.8 mEq/L (ref 3.5–5.1)
Sodium: 137 mEq/L (ref 135–145)

## 2014-09-19 LAB — CBC WITH DIFFERENTIAL/PLATELET
BASOS ABS: 0 10*3/uL (ref 0.0–0.1)
Basophils Relative: 0.7 % (ref 0.0–3.0)
EOS ABS: 0.2 10*3/uL (ref 0.0–0.7)
Eosinophils Relative: 3.5 % (ref 0.0–5.0)
HCT: 42 % (ref 39.0–52.0)
Hemoglobin: 14.5 g/dL (ref 13.0–17.0)
Lymphocytes Relative: 31.9 % (ref 12.0–46.0)
Lymphs Abs: 1.5 10*3/uL (ref 0.7–4.0)
MCHC: 34.5 g/dL (ref 30.0–36.0)
MCV: 88.7 fl (ref 78.0–100.0)
Monocytes Absolute: 0.5 10*3/uL (ref 0.1–1.0)
Monocytes Relative: 9.3 % (ref 3.0–12.0)
NEUTROS ABS: 2.6 10*3/uL (ref 1.4–7.7)
Neutrophils Relative %: 54.6 % (ref 43.0–77.0)
Platelets: 273 10*3/uL (ref 150.0–400.0)
RBC: 4.74 Mil/uL (ref 4.22–5.81)
RDW: 13.5 % (ref 11.5–15.5)
WBC: 4.8 10*3/uL (ref 4.0–10.5)

## 2014-09-19 LAB — HEMOGLOBIN A1C: HEMOGLOBIN A1C: 6.5 % (ref 4.6–6.5)

## 2014-09-19 LAB — PSA: PSA: 1.1 ng/mL (ref 0.10–4.00)

## 2014-09-19 LAB — VITAMIN D 25 HYDROXY (VIT D DEFICIENCY, FRACTURES): VITD: 22.83 ng/mL — AB (ref 30.00–100.00)

## 2014-09-19 MED ORDER — MELOXICAM 7.5 MG PO TABS: 7.5000 mg | ORAL_TABLET | Freq: Every day | ORAL | Status: DC | PRN

## 2014-09-19 MED ORDER — ATORVASTATIN CALCIUM 10 MG PO TABS: 10.0000 mg | ORAL_TABLET | Freq: Every day | ORAL | Status: DC

## 2014-09-19 MED ORDER — FENOFIBRATE 160 MG PO TABS: 160.0000 mg | ORAL_TABLET | Freq: Every day | ORAL | Status: DC

## 2014-09-19 MED ORDER — LOSARTAN POTASSIUM-HCTZ 100-25 MG PO TABS: 1.0000 | ORAL_TABLET | Freq: Every day | ORAL | Status: DC

## 2014-09-19 NOTE — Assessment & Plan Note (Signed)
Good compliance with fenofibrate, run out of Lipitor and did not call for a refill. Did not have side effects. Plan: Refill meds, return to the office fasting in 3 months.

## 2014-09-19 NOTE — Progress Notes (Signed)
Subjective:    Patient ID: Maurice Colon, male    DOB: November 25, 1932, 81 y.o.   MRN: [id number]  DOS:  09/19/2014 Type of visit - description :    Here for Medicare AWV: 1.Risk factors based on Past M, S, F history: reviewed  2.Physical Activities: no routine exercise  3. Depression/mood: denies , no problems noted  4.Hearing: denies problems , no problems noted  5. ADL's: totally independent  6.Fall Risk: low risk, no recent falls, no balance issues  7.Home Safety: feels safe at home  8.Height, weight, &visual acuity: see VS, s/p cataract surgery B w/ good results, sees eye doctor yearly  9.Counseling: yes, see below  10.Labs ordered based on risk factors: yes  11.Referral Coordination, if needed  12.Care Plan, see a/p . Written instructions provided  13.Cognitive Assessment: motor skills, memory, cognition seems appropriate 14. Care team updated -- sees opth  in addition, we discussed the following issues High cholesterol, good compliance with fenofibrate, he took Lipitor without any side effects, ran out and stopped taking it. Hypertension, BP today is elevated, ambulatory BPs normal. Not taking carvedilol daily because he is concerned  --->  pulse drops to the 48s to 50s . During those times he is asymptomatic. Osteoporosis, not taking Fosamax. Chart reviewed, due for a bone density test    Review of Systems Constitutional: No fever, chills. No unexplained wt changes. No unusual sweats HEENT: No dental problems, ear discharge, facial swelling, voice changes. No eye discharge, redness or intolerance to light Respiratory: No wheezing or difficulty breathing. No cough , mucus production Cardiovascular: No CP, leg swelling or palpitations GI: no nausea, vomiting, diarrhea or abdominal pain.  No blood in the stools. No dysphagia   Endocrine: No polyphagia, polyuria or polydipsia GU: No dysuria, gross hematuria, difficulty urinating. No urinary  urgency or frequency. Musculoskeletal: No joint swellings or unusual aches or pains Skin: No change in the color of the skin, palor or rash Allergic, immunologic: No environmental allergies or food allergies Neurological: No dizziness or syncope. No headaches. No diplopia, slurred speech, motor deficits, facial numbness Hematological: No enlarged lymph nodes, easy bruising or bleeding Psychiatry: No suicidal ideas, hallucinations, behavior problems or confusion. No unusual/severe anxiety or depression.     Past Medical History  Diagnosis Date  . Hyperlipidemia     Hypertriglyceridemia  . Hypertension   . Osteoporosis   . Hypogonadism, male   . DJD (degenerative joint disease)   . Rosacea   . Prediabetes 07/16/2013    Past Surgical History  Procedure Laterality Date  . Inguinal hernia repair    . Cataract extraction      Bilaterally    History   Social History  . Marital Status: Married    Spouse Name: N/A  . Number of Children: 2  . Years of Education: N/A   Occupational History  . retired   .     Social History Main Topics  . Smoking status: Never Smoker   . Smokeless tobacco: Never Used  . Alcohol Use: No     Comment: wine, occasionally  . Drug Use: Not on file  . Sexual Activity: Not on file   Other Topics Concern  . Not on file   Social History Narrative   [place]   Lives w/ wife and 1 daughter   [place]           Family History  Problem Relation Age of Onset  .  Diabetes Mother   . Hypertension Mother   . Coronary artery disease Neg Hx   . Cancer Neg Hx        Medication List       This list is accurate as of: 09/19/14  6:46 PM.  Always use your most recent med list.               atorvastatin 10 MG tablet  Commonly known as:  LIPITOR  Take 1 tablet (10 mg total) by mouth daily.     fenofibrate 160 MG tablet  Take 1 tablet (160 mg total) by mouth daily.     losartan-hydrochlorothiazide 100-25 MG  per tablet  Commonly known as:  HYZAAR  Take 1 tablet by mouth daily.     meloxicam 7.5 MG tablet  Commonly known as:  MOBIC  Take 1-2 tablets (7.5-15 mg total) by mouth daily as needed.           Objective:   Physical Exam BP 177/73 mmHg  Pulse 58  Temp(Src) 97.7 F (36.5 C) (Oral)  Ht  (1.702 m)  Wt 203 lb 2 oz (92.137 kg)  BMI 31.81 kg/m2  SpO2 95%  General:   Well developed, well nourished . NAD.  Neck:  Full range of motion. Supple. No  thyromegaly , normal carotid pulse . HEENT:  Normocephalic . Face symmetric, atraumatic Lungs:  CTA B Normal respiratory effort, no intercostal retractions, no accessory muscle use. Heart: RRR,  no murmur.  Abdomen:  Not distended, soft, non-tender. No rebound or rigidity. No mass,organomegaly Muscle skeletal: no pretibial edema bilaterally  Skin: Exposed areas without rash. Not pale. Not jaundice Rectal:  External abnormalities: none. Normal sphincter tone. No rectal masses or tenderness.  Stool brown  Prostate: Prostate gland firm and smooth, no enlargement, nodularity, tenderness, mass, asymmetry or induration.  Neurologic:  alert & oriented X3.  Speech normal, gait appropriate for age and unassisted Strength symmetric and appropriate for age.  Psych: Cognition and judgment appear intact.  Cooperative with normal attention span and concentration.  Behavior appropriate. No anxious or depressed appearing.        Assessment & Plan:

## 2014-09-19 NOTE — Patient Instructions (Addendum)
Get your blood work before you leave   Take vitamin D OTC 1000 units every day  Stop carvedilol  Check the  blood pressure 2 or 3 times a   Week  Be sure your blood pressure is between 110/65 and  145/85.  if it is consistently higher or lower, let me know     Please come back to the office in 3 months  for a routine check up   Come back fasting        Fall Prevention and Home Safety Falls cause injuries and can affect all age groups. It is possible to use preventive measures to significantly decrease the likelihood of falls. There are many simple measures which can make your home safer and prevent falls. OUTDOORS  Repair cracks and edges of walkways and driveways.  Remove high doorway thresholds.  Trim shrubbery on the main path into your home.  Have good outside lighting.  Clear walkways of tools, rocks, debris, and clutter.  Check that handrails are not broken and are securely fastened. Both sides of steps should have handrails.  Have leaves, snow, and ice cleared regularly.  Use sand or salt on walkways during winter months.  In the garage, clean up grease or oil spills. BATHROOM  Install night lights.  Install grab bars by the toilet and in the tub and shower.  Use non-skid mats or decals in the tub or shower.  Place a plastic non-slip stool in the shower to sit on, if needed.  Keep floors dry and clean up all water on the floor immediately.  Remove soap buildup in the tub or shower on a regular basis.  Secure bath mats with non-slip, double-sided rug tape.  Remove throw rugs and tripping hazards from the floors. BEDROOMS  Install night lights.  Make sure a bedside light is easy to reach.  Do not use oversized bedding.  Keep a telephone by your bedside.  Have a firm chair with side arms to use for getting dressed.  Remove throw rugs and tripping hazards from the floor. KITCHEN  Keep handles on pots and pans turned toward the center of the  stove. Use back burners when possible.  Clean up spills quickly and allow time for drying.  Avoid walking on wet floors.  Avoid hot utensils and knives.  Position shelves so they are not too high or low.  Place commonly used objects within easy reach.  If necessary, use a sturdy step stool with a grab bar when reaching.  Keep electrical cables out of the way.  Do not use floor polish or wax that makes floors slippery. If you must use wax, use non-skid floor wax.  Remove throw rugs and tripping hazards from the floor. STAIRWAYS  Never leave objects on stairs.  Place handrails on both sides of stairways and use them. Fix any loose handrails. Make sure handrails on both sides of the stairways are as long as the stairs.  Check carpeting to make sure it is firmly attached along stairs. Make repairs to worn or loose carpet promptly.  Avoid placing throw rugs at the top or bottom of stairways, or properly secure the rug with carpet tape to prevent slippage. Get rid of throw rugs, if possible.  Have an electrician put in a light switch at the top and bottom of the stairs. OTHER FALL PREVENTION TIPS  Wear low-heel or rubber-soled shoes that are supportive and fit well. Wear closed toe shoes.  When using a stepladder, make sure  it is fully opened and both spreaders are firmly locked. Do not climb a closed stepladder.  Add color or contrast paint or tape to grab bars and handrails in your home. Place contrasting color strips on first and last steps.  Learn and use mobility aids as needed. Install an electrical emergency response system.  Turn on lights to avoid dark areas. Replace light bulbs that burn out immediately. Get light switches that glow.  Arrange furniture to create clear pathways. Keep furniture in the same place.  Firmly attach carpet with non-skid or double-sided tape.  Eliminate uneven floor surfaces.  Select a carpet pattern that does not visually hide the edge of  steps.  Be aware of all pets. OTHER HOME SAFETY TIPS  Set the water temperature for 120 F (48.8 C).  Keep emergency numbers on or near the telephone.  Keep smoke detectors on every level of the home and near sleeping areas. Document Released: 07/09/2002 Document Revised: 01/18/2012 Document Reviewed: 10/08/2011 [facility] Patient Information 2015 [place], [place]. This information is not intended to replace advice given to you by your health care provider. Make sure you discuss any questions you have with your health care provider.    Preventive Care for Adults Ages 4665 and over  Blood pressure check.** / Every 1 to 2 years.  Lipid and cholesterol check.**/ Every 5 years beginning at age 79.  Lung cancer screening. / Every year if you are aged 55-80 years and have a 30-pack-year history of smoking and currently smoke or have quit within the past 15 years. Yearly screening is stopped once you have quit smoking for at least 15 years or develop a health problem that would prevent you from having lung cancer treatment.  Fecal occult blood test (FOBT) of stool. / Every year beginning at age 79 and continuing until age 875. You may not have to do this test if you get a colonoscopy every 10 years.  Flexible sigmoidoscopy** or colonoscopy.** / Every 5 years for a flexible sigmoidoscopy or every 10 years for a colonoscopy beginning at age 79 and continuing until age 575.  Hepatitis C blood test.** / For all people born from [birth year] through 1965 and any individual with known risks for hepatitis C.  Abdominal aortic aneurysm (AAA) screening.** / A one-time screening for ages 3465 to 6675 years who are current or former smokers.  Skin self-exam. / Monthly.  Influenza vaccine. / Every year.  Tetanus, diphtheria, and acellular pertussis (Tdap/Td) vaccine.** / 1 dose of Td every 10 years.  Varicella vaccine.** / Consult your health care provider.  Zoster vaccine.** / 1 dose for adults aged 10660 years or  older.  Pneumococcal 13-valent conjugate (PCV13) vaccine.** / Consult your health care provider.  Pneumococcal polysaccharide (PPSV23) vaccine.** / 1 dose for all adults aged 79 years and older.  Meningococcal vaccine.** / Consult your health care provider.  Hepatitis A vaccine.** / Consult your health care provider.  Hepatitis B vaccine.** / Consult your health care provider.  Haemophilus influenzae type b (Hib) vaccine.** / Consult your health care provider. **Family history and personal history of risk and conditions may change your health care provider's recommendations. Document Released: 09/14/2001 Document Revised: 07/24/2013 Document Reviewed: 12/14/2010 [facility] Patient Information 2015 [place], [place]. This information is not intended to replace advice given to you by your health care provider. Make sure you discuss any questions you have with your health care provider.

## 2014-09-19 NOTE — Assessment & Plan Note (Addendum)
BP today elevated when he arrived to the office at 177/73, BP recheck 142/72. Ambulatory BPs within normal. Patient not taking carvedilol, he is concerned about bradycardia although he is asymptomatic. At this point will continue with losartan HCT only ,  BP seems to be well-controlled

## 2014-09-19 NOTE — Progress Notes (Signed)
Pre visit review using our clinic review tool, if applicable. No additional management support is needed unless otherwise documented below in the visit note. 

## 2014-09-19 NOTE — Assessment & Plan Note (Signed)
On no medications, check A1c.

## 2014-09-19 NOTE — Assessment & Plan Note (Signed)
Last bone density test 05-2012, T score -2.4. Currently not taking Fosamax, after reviewing the chart is unclear why, he did tolerate it well without side effects. He is willing to restart. Plan: Daily vitamin D OTC Check vitamin D levels Bone density test

## 2014-09-19 NOTE — Assessment & Plan Note (Addendum)
Td -- 2011 Pneumonia shot 05/2007 prevnar-- today shingles immunizations--  Rx provided before, apparently has not taking   Colonoscopy 12-11,  (-), no further screening scopes DRE PSA Labs  Diet, exercise discussed

## 2014-09-27 MED ORDER — VITAMIN D (ERGOCALCIFEROL) 1.25 MG (50000 UNIT) PO CAPS: 50000.0000 [IU] | ORAL_CAPSULE | ORAL | Status: DC

## 2014-09-27 NOTE — Addendum Note (Signed)
Addended by: Dorette GrateFAULKNER, Malon Branton C on: 09/27/2014 10:02 AM   Modules accepted: Orders

## 2014-12-27 ENCOUNTER — Telehealth: Payer: Self-pay | Admitting: Internal Medicine

## 2014-12-27 ENCOUNTER — Ambulatory Visit: Payer: Commercial Managed Care - HMO | Admitting: Internal Medicine

## 2014-12-27 NOTE — Telephone Encounter (Signed)
That is okay, no need to charge for now show

## 2014-12-27 NOTE — Telephone Encounter (Signed)
Caller name: Taveras,Anna Relation to pt:  Daughter  Call back number: [phone]   Reason for call:  Daughter states could not locate new office, daughter is driving and [facility] and [facility] new address.

## 2015-01-06 ENCOUNTER — Encounter: Payer: Self-pay | Admitting: Gastroenterology

## 2015-04-16 ENCOUNTER — Encounter: Payer: Self-pay | Admitting: Internal Medicine

## 2015-04-16 ENCOUNTER — Ambulatory Visit (INDEPENDENT_AMBULATORY_CARE_PROVIDER_SITE_OTHER): Payer: Commercial Managed Care - HMO | Admitting: Internal Medicine

## 2015-04-16 VITALS — BP 162/92 | HR 46 | Temp 98.2°F | Ht 67.0 in | Wt 201.4 lb

## 2015-04-16 DIAGNOSIS — E559 Vitamin D deficiency, unspecified: Secondary | ICD-10-CM

## 2015-04-16 DIAGNOSIS — Z23 Encounter for immunization: Secondary | ICD-10-CM | POA: Diagnosis not present

## 2015-04-16 DIAGNOSIS — E119 Type 2 diabetes mellitus without complications: Secondary | ICD-10-CM

## 2015-04-16 DIAGNOSIS — E785 Hyperlipidemia, unspecified: Secondary | ICD-10-CM

## 2015-04-16 DIAGNOSIS — I10 Essential (primary) hypertension: Secondary | ICD-10-CM

## 2015-04-16 DIAGNOSIS — Z09 Encounter for follow-up examination after completed treatment for conditions other than malignant neoplasm: Secondary | ICD-10-CM

## 2015-04-16 MED ORDER — FENOFIBRATE 160 MG PO TABS: 160.0000 mg | ORAL_TABLET | Freq: Every day | ORAL | Status: DC

## 2015-04-16 MED ORDER — ATORVASTATIN CALCIUM 10 MG PO TABS: 10.0000 mg | ORAL_TABLET | Freq: Every day | ORAL | Status: DC

## 2015-04-16 MED ORDER — LOSARTAN POTASSIUM-HCTZ 100-25 MG PO TABS: 1.0000 | ORAL_TABLET | Freq: Every day | ORAL | Status: DC

## 2015-04-16 NOTE — Assessment & Plan Note (Signed)
Prediabetes: Check A1c, diet and exercise discussed High cholesterol: Due for a FLP, RF medications. Hypertension: BP today elevated, ambulatory BPs consistently 130/70. He does not take excessive meloxicam. Recommend to continue with present care and monitor BPs. See AVS, RF Meds Vitamin D deficiency: Status post ergocalciferol, now on daily OTCs. Check labs Primary care: High-dose flu shot  today

## 2015-04-16 NOTE — Progress Notes (Signed)
Pre visit review using our clinic review tool, if applicable. No additional management support is needed unless otherwise documented below in the visit note. 

## 2015-04-16 NOTE — Progress Notes (Signed)
Subjective:    Patient ID: Maurice Colon, male    DOB: 09-11-1932, 81 y.o.   MRN: [id number]  DOS:  04/16/2015 Type of visit - description : Routine visit Interval history: Hypertension: Good compliance of medication, ambulatory BPs 138/72 DJD: Very rarely takes meloxicam Prediabetes: Due for a A1c Hyperlipidemia: Due for a FLP, needs a refill medications   Review of Systems No chest pain or difficulty breathing No nausea, vomiting, diarrhea. No blood in the stools  Past Medical History  Diagnosis Date  . Hyperlipidemia     Hypertriglyceridemia  . Hypertension   . Osteoporosis   . Hypogonadism, male   . DJD (degenerative joint disease)   . Rosacea   . Prediabetes 07/16/2013    Past Surgical History  Procedure Laterality Date  . Inguinal hernia repair    . Cataract extraction      Bilaterally    Social History   Social History  . Marital Status: Married    Spouse Name: N/A  . Number of Children: 2  . Years of Education: N/A   Occupational History  . retired   .     Social History Main Topics  . Smoking status: Never Smoker   . Smokeless tobacco: Never Used  . Alcohol Use: No     Comment: wine, occasionally  . Drug Use: Not on file  . Sexual Activity: Not on file   Other Topics Concern  . Not on file   Social History Narrative   Original from [place]   Lives w/ wife and 1 daughter   Linton Ham to [place] early 2008              Medication List       This list is accurate as of: 04/16/15  6:57 PM.  Always use your most recent med list.               atorvastatin 10 MG tablet  Commonly known as:  LIPITOR  Take 1 tablet (10 mg total) by mouth daily.     cholecalciferol 1000 UNITS tablet  Commonly known as:  VITAMIN D  Take 1,000 Units by mouth daily.     fenofibrate 160 MG tablet  Take 1 tablet (160 mg total) by mouth daily.     losartan-hydrochlorothiazide 100-25 MG per tablet  Commonly known as:  HYZAAR  Take 1 tablet by  mouth daily.     meloxicam 7.5 MG tablet  Commonly known as:  MOBIC  Take 1-2 tablets (7.5-15 mg total) by mouth daily as needed.           Objective:   Physical Exam BP 162/92 mmHg  Pulse 46  Temp(Src) 98.2 F (36.8 C) (Oral)  Ht  (1.702 m)  Wt 201 lb 6 oz (91.343 kg)  BMI 31.53 kg/m2  SpO2 98% General:   Well developed, well nourished . NAD.  HEENT:  Normocephalic . Face symmetric, atraumatic Lungs:  CTA B Normal respiratory effort, no intercostal retractions, no accessory muscle use. Heart: RRR,  no murmur.  No pretibial edema bilaterally  Skin: Not pale. Not jaundice Neurologic:  alert & oriented X3.  Speech normal, gait appropriate for age and unassisted Psych--  Cognition and judgment appear intact.  Cooperative with normal attention span and concentration.  Behavior appropriate. No anxious or depressed appearing.      Assessment & Plan:   Problem list > Prediabetes HTN Dyslipidemia,  high triglycerides Osteoporosis Vitamin D deficiency Hypogonadism DJD Rosacea  A/P Prediabetes: Check A1c, diet and exercise discussed High cholesterol: Due for a FLP, RF medications. Hypertension: BP today elevated, ambulatory BPs consistently 130/70. He does not take excessive meloxicam. Recommend to continue with present care and monitor BPs. See AVS, RF Meds Vitamin D deficiency: Status post ergocalciferol, now on daily OTCs. Check labs Primary care: High-dose flu shot  today

## 2015-04-16 NOTE — Patient Instructions (Signed)
  Please schedule labs to be done within few days (fasting)  Check the  blood pressure 2 or 3 times a week  Be sure your blood pressure is between 110/65 and  145/85.  if it is consistently higher or lower, let me know   Next visit  for a  physical exam by February 2017     Please schedule an appointment at the front desk Please come back fasting

## 2015-04-18 ENCOUNTER — Other Ambulatory Visit (INDEPENDENT_AMBULATORY_CARE_PROVIDER_SITE_OTHER): Payer: Commercial Managed Care - HMO

## 2015-04-18 DIAGNOSIS — E785 Hyperlipidemia, unspecified: Secondary | ICD-10-CM | POA: Diagnosis not present

## 2015-04-18 DIAGNOSIS — E119 Type 2 diabetes mellitus without complications: Secondary | ICD-10-CM | POA: Diagnosis not present

## 2015-04-18 DIAGNOSIS — R7989 Other specified abnormal findings of blood chemistry: Secondary | ICD-10-CM

## 2015-04-18 DIAGNOSIS — E559 Vitamin D deficiency, unspecified: Secondary | ICD-10-CM

## 2015-04-18 DIAGNOSIS — I10 Essential (primary) hypertension: Secondary | ICD-10-CM | POA: Diagnosis not present

## 2015-04-18 DIAGNOSIS — R945 Abnormal results of liver function studies: Principal | ICD-10-CM

## 2015-04-18 LAB — LIPID PANEL
CHOL/HDL RATIO: 4
Cholesterol: 116 mg/dL (ref 0–200)
HDL: 32.2 mg/dL — AB (ref >39.00)
LDL Cholesterol: 48 mg/dL (ref 0–99)
NonHDL: 84.25
TRIGLYCERIDES: 179 mg/dL — AB (ref 0.0–149.0)
VLDL: 35.8 mg/dL (ref 0.0–40.0)

## 2015-04-18 LAB — BASIC METABOLIC PANEL
BUN: 17 mg/dL (ref 6–23)
CHLORIDE: 102 meq/L (ref 96–112)
CO2: 28 mEq/L (ref 19–32)
Calcium: 9.4 mg/dL (ref 8.4–10.5)
Creatinine, Ser: 1.07 mg/dL (ref 0.40–1.50)
GFR: 70.68 mL/min (ref >60.00)
Glucose, Bld: 104 mg/dL — ABNORMAL HIGH (ref 70–99)
POTASSIUM: 3.7 meq/L (ref 3.5–5.1)
SODIUM: 138 meq/L (ref 135–145)

## 2015-04-18 LAB — HEMOGLOBIN A1C: HEMOGLOBIN A1C: 6.3 % (ref 4.6–6.5)

## 2015-04-18 LAB — AST: AST: 72 U/L — AB (ref 0–37)

## 2015-04-18 LAB — ALT: ALT: 73 U/L — AB (ref 0–53)

## 2015-04-21 LAB — VITAMIN D 1,25 DIHYDROXY
Vitamin D 1, 25 (OH)2 Total: 35 pg/mL (ref 18–72)
Vitamin D3 1, 25 (OH)2: 35 pg/mL

## 2015-04-23 NOTE — Addendum Note (Signed)
Addended by: Dorette Grate on: 04/23/2015 10:33 AM   Modules accepted: Orders

## 2015-05-14 ENCOUNTER — Other Ambulatory Visit (INDEPENDENT_AMBULATORY_CARE_PROVIDER_SITE_OTHER): Payer: Commercial Managed Care - HMO

## 2015-05-14 DIAGNOSIS — R7989 Other specified abnormal findings of blood chemistry: Secondary | ICD-10-CM | POA: Diagnosis not present

## 2015-05-14 DIAGNOSIS — R945 Abnormal results of liver function studies: Principal | ICD-10-CM

## 2015-05-14 LAB — HEPATIC FUNCTION PANEL
ALBUMIN: 3.9 g/dL (ref 3.5–5.2)
ALT: 64 U/L — AB (ref 0–53)
AST: 60 U/L — ABNORMAL HIGH (ref 0–37)
Alkaline Phosphatase: 58 U/L (ref 39–117)
BILIRUBIN TOTAL: 0.6 mg/dL (ref 0.2–1.2)
Bilirubin, Direct: 0.2 mg/dL (ref 0.0–0.3)
TOTAL PROTEIN: 7.3 g/dL (ref 6.0–8.3)

## 2015-05-16 NOTE — Addendum Note (Signed)
Addended by: Dorette GrateFAULKNER, Deyonna Fitzsimmons C on: 05/16/2015 01:29 PM   Modules accepted: Orders, Medications

## 2015-05-16 NOTE — Addendum Note (Signed)
Addended by: Dorette GrateFAULKNER, Sharnese Heath C on: 05/16/2015 01:28 PM   Modules accepted: Orders

## 2015-05-29 DIAGNOSIS — H35033 Hypertensive retinopathy, bilateral: Secondary | ICD-10-CM | POA: Diagnosis not present

## 2015-05-29 DIAGNOSIS — H353131 Nonexudative age-related macular degeneration, bilateral, early dry stage: Secondary | ICD-10-CM | POA: Diagnosis not present

## 2015-05-29 DIAGNOSIS — H04123 Dry eye syndrome of bilateral lacrimal glands: Secondary | ICD-10-CM | POA: Diagnosis not present

## 2015-05-29 DIAGNOSIS — H35373 Puckering of macula, bilateral: Secondary | ICD-10-CM | POA: Diagnosis not present

## 2015-07-06 ENCOUNTER — Other Ambulatory Visit: Payer: Self-pay | Admitting: Internal Medicine

## 2015-07-13 ENCOUNTER — Telehealth: Payer: Self-pay | Admitting: Internal Medicine

## 2015-07-13 NOTE — Telephone Encounter (Signed)
Due for recheck LFTs, please arrange a lab appointment.

## 2015-07-14 NOTE — Telephone Encounter (Signed)
Called pt and informed the below, scheduled lab appt for tomorrow 07-15-15.

## 2015-07-15 ENCOUNTER — Ambulatory Visit (INDEPENDENT_AMBULATORY_CARE_PROVIDER_SITE_OTHER): Payer: Commercial Managed Care - HMO | Admitting: Internal Medicine

## 2015-07-15 ENCOUNTER — Other Ambulatory Visit: Payer: Commercial Managed Care - HMO

## 2015-07-15 ENCOUNTER — Encounter: Payer: Self-pay | Admitting: Internal Medicine

## 2015-07-15 VITALS — BP 154/62 | HR 47 | Temp 97.9°F | Ht 67.0 in | Wt 202.4 lb

## 2015-07-15 DIAGNOSIS — Z09 Encounter for follow-up examination after completed treatment for conditions other than malignant neoplasm: Secondary | ICD-10-CM

## 2015-07-15 DIAGNOSIS — R945 Abnormal results of liver function studies: Secondary | ICD-10-CM

## 2015-07-15 DIAGNOSIS — R7989 Other specified abnormal findings of blood chemistry: Secondary | ICD-10-CM | POA: Diagnosis not present

## 2015-07-15 DIAGNOSIS — K529 Noninfective gastroenteritis and colitis, unspecified: Secondary | ICD-10-CM

## 2015-07-15 DIAGNOSIS — R799 Abnormal finding of blood chemistry, unspecified: Secondary | ICD-10-CM | POA: Diagnosis not present

## 2015-07-15 LAB — HEPATIC FUNCTION PANEL
ALK PHOS: 69 U/L (ref 39–117)
ALT: 97 U/L — AB (ref 0–53)
AST: 84 U/L — AB (ref 0–37)
Albumin: 4 g/dL (ref 3.5–5.2)
BILIRUBIN TOTAL: 0.6 mg/dL (ref 0.2–1.2)
Bilirubin, Direct: 0.1 mg/dL (ref 0.0–0.3)
Total Protein: 7.5 g/dL (ref 6.0–8.3)

## 2015-07-15 LAB — HEPATITIS B SURFACE ANTIBODY,QUALITATIVE: Hep B S Ab: NEGATIVE

## 2015-07-15 LAB — HEPATITIS C ANTIBODY: HCV AB: NEGATIVE

## 2015-07-15 LAB — HEPATITIS B SURFACE ANTIGEN: Hepatitis B Surface Ag: NEGATIVE

## 2015-07-15 NOTE — Progress Notes (Signed)
Pre visit review using our clinic review tool, if applicable. No additional management support is needed unless otherwise documented below in the visit note. 

## 2015-07-15 NOTE — Patient Instructions (Addendum)
Get your blood work before you leave    Rest, drink plenty of fluids, call if your stomach problems come back

## 2015-07-15 NOTE — Progress Notes (Signed)
Subjective:    Patient ID: Maurice Colon, male    DOB: 1932-09-22, 81 y.o.   MRN: [id number]  DOS:  07/15/2015 Type of visit - description : Acute visit Interval history: Symptoms started at 5 PM yesterday: Epigastric burning, some nausea, vomited 4, had 4 episodes of diarrhea. This morning he is feeling normal, had a normal BM. Abdominal pain resolved. HTN: Ambulatory BPs 130, 138. Increase LFTs: Due for labs, not taking Tylenol. he did take Mucinex for a recent cold which is essentially now resolved.  Review of Systems No fever chills No melena or hematemesis  Past Medical History  Diagnosis Date  . Hyperlipidemia     Hypertriglyceridemia  . Hypertension   . Osteoporosis   . Hypogonadism, male   . DJD (degenerative joint disease)   . Rosacea   . Prediabetes 07/16/2013    Past Surgical History  Procedure Laterality Date  . Inguinal hernia repair    . Cataract extraction      Bilaterally    Social History   Social History  . Marital Status: Married    Spouse Name: N/A  . Number of Children: 2  . Years of Education: N/A   Occupational History  . retired   .     Social History Main Topics  . Smoking status: Never Smoker   . Smokeless tobacco: Never Used  . Alcohol Use: No     Comment: wine, occasionally  . Drug Use: Not on file  . Sexual Activity: Not on file   Other Topics Concern  . Not on file   Social History Narrative   [place]   Lives w/ wife and 1 daughter   [place] Ham to [place] early 2008              Medication List       This list is accurate as of: 07/15/15  1:17 PM.  Always use your most recent med list.               cholecalciferol 1000 UNITS tablet  Commonly known as:  VITAMIN D  Take 1,000 Units by mouth daily.     fenofibrate 160 MG tablet  Take 1 tablet (160 mg total) by mouth daily.     losartan-hydrochlorothiazide 100-25 MG tablet  Commonly known as:  HYZAAR  Take 1 tablet by mouth daily.       meloxicam 7.5 MG tablet  Commonly known as:  MOBIC  Take 1-2 tablets (7.5-15 mg total) by mouth daily as needed.           Objective:   Physical Exam BP 154/62 mmHg  Pulse 47  Temp(Src) 97.9 F (36.6 C) (Oral)  Ht  (1.702 m)  Wt 202 lb 6 oz (91.797 kg)  BMI 31.69 kg/m2  SpO2 95% General:   Well developed, well nourished . NAD.  HEENT:  Normocephalic . Face symmetric, atraumatic Lungs:  CTA B Normal respiratory effort, no intercostal retractions, no accessory muscle use. Heart: RRR,  no murmur.  no pretibial edema bilaterally  Abdomen:  Not distended, soft, non-tender. No rebound or rigidity.   Skin: Not pale. Not jaundice Neurologic:  alert & oriented X3.  Speech normal, gait appropriate for age and unassisted Psych--  Cognition and judgment appear intact.  Cooperative with normal attention span and concentration.  Behavior appropriate. No anxious or depressed appearing.    Assessment & Plan:   Assessment > Prediabetes HTN Dyslipidemia,  high triglycerides Osteoporosis Vitamin  D deficiency Hypogonadism DJD Rosacea  PLAN: Increase LFTs: First noted about 04-2015, doesn't  drink excessively, not taking Tylenol. We did change simvastatin to Lipitor 04-2014 and Lipitor was DC 05-16-2015. Recheck ast-alt and  hepatitis serology. Acute gastroenteritis: Symptoms resolved, abdominal exam benign. Recommend clear fluids and observation. HTN: BP today is elevated, consistently okay at home in the 130s. No change RTC-2017 as scheduled

## 2015-07-15 NOTE — Assessment & Plan Note (Signed)
Increase LFTs: First noted about 04-2015, doesn't  drink excessively, not taking Tylenol. We did change simvastatin to Lipitor 04-2014 and Lipitor was DC 05-16-2015. Recheck ast-alt and  hepatitis serology. Acute gastroenteritis: Symptoms resolved, abdominal exam benign. Recommend clear fluids and observation. HTN: BP today is elevated, consistently okay at home in the 130s. No change RTC-2017 as scheduled

## 2015-07-17 DIAGNOSIS — E669 Obesity, unspecified: Secondary | ICD-10-CM | POA: Diagnosis not present

## 2015-07-17 DIAGNOSIS — E784 Other hyperlipidemia: Secondary | ICD-10-CM | POA: Diagnosis not present

## 2015-07-17 DIAGNOSIS — I10 Essential (primary) hypertension: Secondary | ICD-10-CM | POA: Diagnosis not present

## 2015-07-17 DIAGNOSIS — Z6832 Body mass index (BMI) 32.0-32.9, adult: Secondary | ICD-10-CM | POA: Diagnosis not present

## 2015-07-17 DIAGNOSIS — Z0000 Encounter for general adult medical examination without abnormal findings: Secondary | ICD-10-CM | POA: Diagnosis not present

## 2015-07-17 NOTE — Addendum Note (Signed)
Addended byConrad [place]: Quavion Boule D on: 07/17/2015 01:07 PM   Modules accepted: Orders

## 2015-07-18 ENCOUNTER — Ambulatory Visit (HOSPITAL_BASED_OUTPATIENT_CLINIC_OR_DEPARTMENT_OTHER)
Admission: RE | Admit: 2015-07-18 | Discharge: 2015-07-18 | Disposition: A | Discharge status: Home / Self Care | Payer: Commercial Managed Care - HMO | Source: Ambulatory Visit | Attending: Internal Medicine | Admitting: Internal Medicine

## 2015-07-18 DIAGNOSIS — K760 Fatty (change of) liver, not elsewhere classified: Secondary | ICD-10-CM | POA: Diagnosis not present

## 2015-07-18 DIAGNOSIS — E785 Hyperlipidemia, unspecified: Secondary | ICD-10-CM | POA: Insufficient documentation

## 2015-07-18 DIAGNOSIS — R945 Abnormal results of liver function studies: Secondary | ICD-10-CM | POA: Diagnosis not present

## 2015-08-18 ENCOUNTER — Telehealth: Payer: Self-pay | Admitting: Internal Medicine

## 2015-08-18 DIAGNOSIS — R945 Abnormal results of liver function studies: Principal | ICD-10-CM

## 2015-08-18 DIAGNOSIS — R7989 Other specified abnormal findings of blood chemistry: Secondary | ICD-10-CM

## 2015-08-18 NOTE — Telephone Encounter (Signed)
Annice PihJackie, please call the patient and schedule a lab appointment for this week, others are already in, no fasting.  Then send the message to the lab, in addition to LFTs they need to get a rainbow in case we need more labs.

## 2015-08-19 NOTE — Telephone Encounter (Signed)
Pt was schedule for tomorrow 08-20-15 for labs.

## 2015-08-20 ENCOUNTER — Other Ambulatory Visit (INDEPENDENT_AMBULATORY_CARE_PROVIDER_SITE_OTHER): Payer: Commercial Managed Care - HMO

## 2015-08-20 DIAGNOSIS — R7989 Other specified abnormal findings of blood chemistry: Secondary | ICD-10-CM | POA: Diagnosis not present

## 2015-08-20 DIAGNOSIS — R945 Abnormal results of liver function studies: Principal | ICD-10-CM

## 2015-08-20 LAB — ALT: ALT: 54 U/L — AB (ref 0–53)

## 2015-08-20 LAB — AST: AST: 59 U/L — AB (ref 0–37)

## 2015-08-22 NOTE — Addendum Note (Signed)
Addended byConrad [place] D on: 08/22/2015 01:46 PM   Modules accepted: Orders

## 2015-09-16 ENCOUNTER — Ambulatory Visit (INDEPENDENT_AMBULATORY_CARE_PROVIDER_SITE_OTHER): Payer: Commercial Managed Care - HMO | Admitting: Internal Medicine

## 2015-09-16 ENCOUNTER — Encounter: Payer: Self-pay | Admitting: Internal Medicine

## 2015-09-16 VITALS — BP 122/60 | HR 50 | Temp 98.0°F | Ht 67.0 in | Wt 202.4 lb

## 2015-09-16 DIAGNOSIS — R7989 Other specified abnormal findings of blood chemistry: Secondary | ICD-10-CM

## 2015-09-16 DIAGNOSIS — E785 Hyperlipidemia, unspecified: Secondary | ICD-10-CM | POA: Diagnosis not present

## 2015-09-16 DIAGNOSIS — Z09 Encounter for follow-up examination after completed treatment for conditions other than malignant neoplasm: Secondary | ICD-10-CM

## 2015-09-16 DIAGNOSIS — R799 Abnormal finding of blood chemistry, unspecified: Secondary | ICD-10-CM | POA: Diagnosis not present

## 2015-09-16 DIAGNOSIS — R7303 Prediabetes: Secondary | ICD-10-CM

## 2015-09-16 DIAGNOSIS — R945 Abnormal results of liver function studies: Principal | ICD-10-CM

## 2015-09-16 LAB — IRON: IRON: 125 ug/dL (ref 42–165)

## 2015-09-16 LAB — FERRITIN: FERRITIN: 186.8 ng/mL (ref 22.0–322.0)

## 2015-09-16 LAB — HEMOGLOBIN A1C: Hgb A1c MFr Bld: 6.4 % (ref 4.6–6.5)

## 2015-09-16 MED ORDER — MELOXICAM 7.5 MG PO TABS: 7.5000 mg | ORAL_TABLET | Freq: Every day | ORAL | Status: DC | PRN

## 2015-09-16 NOTE — Progress Notes (Signed)
Pre visit review using our clinic review tool, if applicable. No additional management support is needed unless otherwise documented below in the visit note. 

## 2015-09-16 NOTE — Patient Instructions (Signed)
GO TO THE LAB : Get the blood work    GO TO THE FRONT DESK  Schedule a complete physical exam to be done in 3 months  Please be fasting   Start ASA 81 1 tablet a day  Stop fenofibrate  We are referring you to the somach doctor (GI)

## 2015-09-16 NOTE — Assessment & Plan Note (Signed)
Prediabetes: Check A1c Dyslipidemia: Still taking Lipitor, apparently misunderstood my instructions, recommend to stop Lipitor today. We'll also stop fenofibrate and recheck FLP on RTC. Increase LFTs: se w/u done so far above >>  We will check a ceruloplasmin, ferritin, iron and alpha-1 antitrypsin. DC Lipitor as stated above. GI eval pending. Osteoporosis: DEXA prescribed last year but never done. Will reassess on return to the office RTC 3 months, fasting, CPX

## 2015-09-16 NOTE — Progress Notes (Signed)
Subjective:    Patient ID: Maurice Colon, male    DOB: [birth year], 82 y.o.   MRN: [id number]  DOS:  09/16/2015 Type of visit - description : Checkup Interval history: Increase LFTs: The patient apparently misunderstood my instructions and he still taking Lipitor. Needs a RF of meloxicam, he takes it sporadically. In general feeling well.  Review of Systems   Past Medical History  Diagnosis Date  . Hyperlipidemia     Hypertriglyceridemia  . Hypertension   . Osteoporosis   . Hypogonadism, male   . DJD (degenerative joint disease)   . Rosacea   . Prediabetes 07/16/2013    Past Surgical History  Procedure Laterality Date  . Inguinal hernia repair    . Cataract extraction      Bilaterally    Social History   Social History  . Marital Status: Married    Spouse Name: N/A  . Number of Children: 2  . Years of Education: N/A   Occupational History  . retired   .     Social History Main Topics  . Smoking status: Never Smoker   . Smokeless tobacco: Never Used  . Alcohol Use: No     Comment: wine, occasionally  . Drug Use: Not on file  . Sexual Activity: Not on file   Other Topics Concern  . Not on file   Social History Narrative   [place]   Lives w/ wife and 1 daughter   L[place] Ham to [place] early 2008              Medication List       This list is accurate as of: 09/16/15  2:51 PM.  Always use your most recent med list.               aspirin 81 MG tablet  Take 81 mg by mouth daily.     cholecalciferol 1000 units tablet  Commonly known as:  VITAMIN D  Take 1,000 Units by mouth daily. Reported on 09/16/2015     losartan-hydrochlorothiazide 100-25 MG tablet  Commonly known as:  HYZAAR  Take 1 tablet by mouth daily.     meloxicam 7.5 MG tablet  Commonly known as:  MOBIC  Take 1-2 tablets (7.5-15 mg total) by mouth daily as needed.           Objective:   Physical Exam BP 122/60 mmHg  Pulse 50  Temp(Src) 98 F  (36.7 C) (Oral)  Ht  (1.702 m)  Wt 202 lb 6 oz (91.797 kg)  BMI 31.69 kg/m2  SpO2 96% General:   Well developed, well nourished . NAD.  HEENT:  Normocephalic . Face symmetric, atraumatic Lungs:  CTA B Normal respiratory effort, no intercostal retractions, no accessory muscle use. Heart: RRR,  no murmur.  No pretibial edema bilaterally  Diabetic foot exam: Normal pinprick examination, good pedal pulses bilaterally. Skin without lesions or ulcers Neurologic:  alert & oriented X3.  Speech normal, gait appropriate for age and unassisted Psych--  Cognition and judgment appear intact.  Cooperative with normal attention span and concentration.  Behavior appropriate. No anxious or depressed appearing.      Assessment & Plan:   Assessment > Prediabetes HTN Dyslipidemia,  high triglycerides Osteoporosis--- 05-2012, T score -2.4. Vitamin D deficiency Hypogonadism DJD-- meloxicam prn Rosacea Increase LFTs --First noted about 04-2015, no etoh/tylenol. changed simvastatin to Lipitor 04-2014 and Lipitor was 2-17. Fatty liver per [place] 07-18-2015  Plan: Prediabetes: Check A1c  Dyslipidemia: Still taking Lipitor, apparently misunderstood my instructions, recommend to stop Lipitor today. We'll also stop fenofibrate and recheck FLP on RTC. Increase LFTs: se w/u done so far above >>  We will check a ceruloplasmin, ferritin, iron and alpha-1 antitrypsin. DC Lipitor as stated above. GI eval pending. Osteoporosis: DEXA prescribed last year but never done. Will reassess on return to the office RTC 3 months, fasting, CPX

## 2015-09-18 LAB — CERULOPLASMIN: Ceruloplasmin: 22 mg/dL (ref 18–36)

## 2015-09-18 LAB — ALPHA-1-ANTITRYPSIN: A-1 Antitrypsin, Ser: 146 mg/dL (ref 83–199)

## 2016-01-05 ENCOUNTER — Ambulatory Visit (INDEPENDENT_AMBULATORY_CARE_PROVIDER_SITE_OTHER): Payer: Commercial Managed Care - HMO | Admitting: Internal Medicine

## 2016-01-05 ENCOUNTER — Encounter: Payer: Self-pay | Admitting: Internal Medicine

## 2016-01-05 VITALS — BP 134/70 | HR 52 | Temp 97.8°F | Ht 67.0 in | Wt 203.1 lb

## 2016-01-05 DIAGNOSIS — E785 Hyperlipidemia, unspecified: Secondary | ICD-10-CM

## 2016-01-05 DIAGNOSIS — Z0000 Encounter for general adult medical examination without abnormal findings: Secondary | ICD-10-CM | POA: Diagnosis not present

## 2016-01-05 DIAGNOSIS — M810 Age-related osteoporosis without current pathological fracture: Secondary | ICD-10-CM | POA: Diagnosis not present

## 2016-01-05 DIAGNOSIS — I10 Essential (primary) hypertension: Secondary | ICD-10-CM | POA: Diagnosis not present

## 2016-01-05 LAB — CBC WITH DIFFERENTIAL/PLATELET
BASOS ABS: 0.1 10*3/uL (ref 0.0–0.1)
Basophils Relative: 0.8 % (ref 0.0–3.0)
EOS ABS: 0.2 10*3/uL (ref 0.0–0.7)
EOS PCT: 3.9 % (ref 0.0–5.0)
HCT: 40.4 % (ref 39.0–52.0)
HEMOGLOBIN: 14 g/dL (ref 13.0–17.0)
Lymphocytes Relative: 33.9 % (ref 12.0–46.0)
Lymphs Abs: 2.1 10*3/uL (ref 0.7–4.0)
MCHC: 34.7 g/dL (ref 30.0–36.0)
MCV: 89.7 fl (ref 78.0–100.0)
MONO ABS: 0.6 10*3/uL (ref 0.1–1.0)
Monocytes Relative: 10 % (ref 3.0–12.0)
Neutro Abs: 3.2 10*3/uL (ref 1.4–7.7)
Neutrophils Relative %: 51.4 % (ref 43.0–77.0)
PLATELETS: 277 10*3/uL (ref 150.0–400.0)
RBC: 4.51 Mil/uL (ref 4.22–5.81)
RDW: 13.6 % (ref 11.5–15.5)
WBC: 6.2 10*3/uL (ref 4.0–10.5)

## 2016-01-05 LAB — LIPID PANEL
Cholesterol: 133 mg/dL (ref 0–200)
HDL: 28.5 mg/dL — AB (ref >39.00)
NONHDL: 104.66
Total CHOL/HDL Ratio: 5
Triglycerides: 282 mg/dL — ABNORMAL HIGH (ref 0.0–149.0)
VLDL: 56.4 mg/dL — ABNORMAL HIGH (ref 0.0–40.0)

## 2016-01-05 LAB — COMPREHENSIVE METABOLIC PANEL
ALT: 29 U/L (ref 0–53)
AST: 34 U/L (ref 0–37)
Albumin: 4.2 g/dL (ref 3.5–5.2)
Alkaline Phosphatase: 64 U/L (ref 39–117)
BILIRUBIN TOTAL: 0.7 mg/dL (ref 0.2–1.2)
BUN: 15 mg/dL (ref 6–23)
CO2: 26 meq/L (ref 19–32)
Calcium: 9.4 mg/dL (ref 8.4–10.5)
Chloride: 103 mEq/L (ref 96–112)
Creatinine, Ser: 0.87 mg/dL (ref 0.40–1.50)
GFR: 89.58 mL/min (ref >60.00)
GLUCOSE: 103 mg/dL — AB (ref 70–99)
Potassium: 3.5 mEq/L (ref 3.5–5.1)
Sodium: 135 mEq/L (ref 135–145)
Total Protein: 7.9 g/dL (ref 6.0–8.3)

## 2016-01-05 LAB — LDL CHOLESTEROL, DIRECT: Direct LDL: 49 mg/dL

## 2016-01-05 MED ORDER — MELOXICAM 7.5 MG PO TABS: 7.5000 mg | ORAL_TABLET | Freq: Every day | ORAL | Status: DC | PRN

## 2016-01-05 MED ORDER — LOSARTAN POTASSIUM-HCTZ 100-25 MG PO TABS: 1.0000 | ORAL_TABLET | Freq: Every day | ORAL | Status: DC

## 2016-01-05 NOTE — Progress Notes (Signed)
Subjective:    Patient ID: Maurice Colon, male    DOB: 31-Oct-1932, 82 y.o.   MRN: [id number]  DOS:  01/05/2016 Type of visit - description : cpx Interval history: No major concerns   Review of Systems Constitutional: No fever. No chills. No unexplained wt changes. No unusual sweats  HEENT: No dental problems, no ear discharge, no facial swelling, no voice changes. No eye discharge, no eye  redness , no  intolerance to light   Respiratory: No wheezing , no  difficulty breathing. Had a URI, sx better, no f/c, cough almost gone  Cardiovascular: No CP, no leg swelling , no  Palpitations  GI: no nausea, no vomiting, no diarrhea , no  abdominal pain.  No blood in the stools. No dysphagia, no odynophagia    Endocrine: No polyphagia, no polyuria , no polydipsia  GU: No dysuria, gross hematuria, difficulty urinating. No urinary urgency, no frequency.  Musculoskeletal: No joint swellings or unusual aches or pains  Skin: No change in the color of the skin, palor , no  Rash  Allergic, immunologic: No environmental allergies , no  food allergies  Neurological: No dizziness no  syncope. No headaches. No diplopia, no slurred, no slurred speech, no motor deficits, no facial  Numbness  Hematological: No enlarged lymph nodes, no easy bruising , no unusual bleedings  Psychiatry: No suicidal ideas, no hallucinations, no beavior problems, no confusion.  No unusual/severe anxiety, no depression    Past Medical History  Diagnosis Date  . Hyperlipidemia     Hypertriglyceridemia  . Hypertension   . Osteoporosis   . Hypogonadism, male   . DJD (degenerative joint disease)   . Rosacea   . Prediabetes 07/16/2013    Past Surgical History  Procedure Laterality Date  . Inguinal hernia repair    . Cataract extraction      Bilaterally    Social History   Social History  . Marital Status: Married    Spouse Name: N/A  . Number of Children: 2  . Years of Education: N/A    Occupational History  . retired   .     Social History Main Topics  . Smoking status: Never Smoker   . Smokeless tobacco: Never Used  . Alcohol Use: No     Comment: wine, occasionally  . Drug Use: Not on file  . Sexual Activity: Not on file   Other Topics Concern  . Not on file   Social History Narrative   [place]   Lives w/ wife and 1 daughter (bipolar)   Moved to [place] early 2008           Family History  Problem Relation Age of Onset  . Diabetes Mother   . Hypertension Mother   . Coronary artery disease Neg Hx   . Cancer Neg Hx        Medication List       This list is accurate as of: 01/05/16 11:59 PM.  Always use your most recent med list.               aspirin 81 MG tablet  Take 81 mg by mouth daily.     cholecalciferol 1000 units tablet  Commonly known as:  VITAMIN D  Take 1,000 Units by mouth daily. Reported on 09/16/2015     losartan-hydrochlorothiazide 100-25 MG tablet  Commonly known as:  HYZAAR  Take 1 tablet by mouth daily.     meloxicam 7.5 MG  tablet  Commonly known as:  MOBIC  Take 1-2 tablets (7.5-15 mg total) by mouth daily as needed.           Objective:   Physical Exam BP 134/70 mmHg  Pulse 52  Temp(Src) 97.8 F (36.6 C) (Oral)  Ht 5\' 7"  (1.702 m)  Wt 203 lb 2 oz (92.137 kg)  BMI 31.81 kg/m2  SpO2 96%  General:   Well developed, well nourished . NAD.  Neck: No  thyromegaly  HEENT:  Normocephalic . Face symmetric, atraumatic Lungs:  CTA B Normal respiratory effort, no intercostal retractions, no accessory muscle use. Heart: RRR,  no murmur.  No pretibial edema bilaterally  Abdomen:  Not distended, soft, non-tender. No rebound or rigidity.   Skin: Exposed areas without rash. Not pale. Not jaundice Neurologic:  alert & oriented X3.  Speech normal, gait appropriate for age and unassisted Strength symmetric and appropriate for age.  Psych: Cognition and judgment appear intact.  Cooperative with  normal attention span and concentration.  Behavior appropriate. No anxious or depressed appearing.    Assessment & Plan:   Assessment > Prediabetes HTN Dyslipidemia,  high triglycerides Osteoporosis--- 05-2012, T score -2.4. Vitamin D deficiency Hypogonadism DJD-- meloxicam prn Rosacea Increase LFTs -- First noted about 04-2015, no etoh/tylenol. changed simvastatin to Lipitor 04-2014 and Lipitor was d/c 2-17. We also d/c fenofibrates 09-2015   Fatty liver per [place] 07-18-2015 Hep B-C  Ne 07-2015 ceruloplasmin, ferritin, iron and alpha-1 antitrypsin ---> (-) 09-2015  PLAN: Prediabetes: Doing well with lifestyle, last A1c satisfactory HTN: Well-controlled, continue Hyzaar, refill sent Hyperlipidemia: On no medications, check a FLP Osteoporosis:  On Vit  D supplements, recheck a bone density test DJD: Refill of meloxicam Increase LFTs: Off statins and fenofibrate, workup so far negative, recheck labs. RTC 4 months

## 2016-01-05 NOTE — Patient Instructions (Addendum)
Get your blood work before you leave   Next visit 4 months    Fall Prevention and Home Safety Falls cause injuries and can affect all age groups. It is possible to use preventive measures to significantly decrease the likelihood of falls. There are many simple measures which can make your home safer and prevent falls. OUTDOORS  Repair cracks and edges of walkways and driveways.  Remove high doorway thresholds.  Trim shrubbery on the main path into your home.  Have good outside lighting.  Clear walkways of tools, rocks, debris, and clutter.  Check that handrails are not broken and are securely fastened. Both sides of steps should have handrails.  Have leaves, snow, and ice cleared regularly.  Use sand or salt on walkways during winter months.  In the garage, clean up grease or oil spills. BATHROOM  Install night lights.  Install grab bars by the toilet and in the tub and shower.  Use non-skid mats or decals in the tub or shower.  Place a plastic non-slip stool in the shower to sit on, if needed.  Keep floors dry and clean up all water on the floor immediately.  Remove soap buildup in the tub or shower on a regular basis.  Secure bath mats with non-slip, double-sided rug tape.  Remove throw rugs and tripping hazards from the floors. BEDROOMS  Install night lights.  Make sure a bedside light is easy to reach.  Do not use oversized bedding.  Keep a telephone by your bedside.  Have a firm chair with side arms to use for getting dressed.  Remove throw rugs and tripping hazards from the floor. KITCHEN  Keep handles on pots and pans turned toward the center of the stove. Use back burners when possible.  Clean up spills quickly and allow time for drying.  Avoid walking on wet floors.  Avoid hot utensils and knives.  Position shelves so they are not too high or low.  Place commonly used objects within easy reach.  If necessary, use a sturdy step stool with  a grab bar when reaching.  Keep electrical cables out of the way.  Do not use floor polish or wax that makes floors slippery. If you must use wax, use non-skid floor wax.  Remove throw rugs and tripping hazards from the floor. STAIRWAYS  Never leave objects on stairs.  Place handrails on both sides of stairways and use them. Fix any loose handrails. Make sure handrails on both sides of the stairways are as long as the stairs.  Check carpeting to make sure it is firmly attached along stairs. Make repairs to worn or loose carpet promptly.  Avoid placing throw rugs at the top or bottom of stairways, or properly secure the rug with carpet tape to prevent slippage. Get rid of throw rugs, if possible.  Have an electrician put in a light switch at the top and bottom of the stairs. OTHER FALL PREVENTION TIPS  Wear low-heel or rubber-soled shoes that are supportive and fit well. Wear closed toe shoes.  When using a stepladder, make sure it is fully opened and both spreaders are firmly locked. Do not climb a closed stepladder.  Add color or contrast paint or tape to grab bars and handrails in your home. Place contrasting color strips on first and last steps.  Learn and use mobility aids as needed. Install an electrical emergency response system.  Turn on lights to avoid dark areas. Replace light bulbs that burn out immediately. Get light   switches that glow.  Arrange furniture to create clear pathways. Keep furniture in the same place.  Firmly attach carpet with non-skid or double-sided tape.  Eliminate uneven floor surfaces.  Select a carpet pattern that does not visually hide the edge of steps.  Be aware of all pets. OTHER HOME SAFETY TIPS  Set the water temperature for 120 F (48.8 C).  Keep emergency numbers on or near the telephone.  Keep smoke detectors on every level of the home and near sleeping areas. Document Released: 07/09/2002 Document Revised: 01/18/2012 Document  Reviewed: 10/08/2011 ExitCare Patient Information 2015 [employer]. This information is not intended to replace advice given to you by your health care provider. Make sure you discuss any questions you have with your health care provider.   Preventive Care for Adults Ages 65 and over  Blood pressure check.** / Every 1 to 2 years.  Lipid and cholesterol check.**/ Every 5 years beginning at age 20.  Lung cancer screening. / Every year if you are aged 55-80 years and have a 30-pack-year history of smoking and currently smoke or have quit within the past 15 years. Yearly screening is stopped once you have quit smoking for at least 15 years or develop a health problem that would prevent you from having lung cancer treatment.  Fecal occult blood test (FOBT) of stool. / Every year beginning at age 50 and continuing until age 75. You may not have to do this test if you get a colonoscopy every 10 years.  Flexible sigmoidoscopy** or colonoscopy.** / Every 5 years for a flexible sigmoidoscopy or every 10 years for a colonoscopy beginning at age 50 and continuing until age 75.  Hepatitis C blood test.** / For all people born from 1945 through 1965 and any individual with known risks for hepatitis C.  Abdominal aortic aneurysm (AAA) screening.** / A one-time screening for ages 65 to 75 years who are current or former smokers.  Skin self-exam. / Monthly.  Influenza vaccine. / Every year.  Tetanus, diphtheria, and acellular pertussis (Tdap/Td) vaccine.** / 1 dose of Td every 10 years.  Varicella vaccine.** / Consult your health care provider.  Zoster vaccine.** / 1 dose for adults aged 60 years or older.  Pneumococcal 13-valent conjugate (PCV13) vaccine.** / Consult your health care provider.  Pneumococcal polysaccharide (PPSV23) vaccine.** / 1 dose for all adults aged 65 years and older.  Meningococcal vaccine.** / Consult your health care provider.  Hepatitis A vaccine.** / Consult your  health care provider.  Hepatitis B vaccine.** / Consult your health care provider.  Haemophilus influenzae type b (Hib) vaccine.** / Consult your health care provider. **Family history and personal history of risk and conditions may change your health care provider's recommendations. Document Released: 09/14/2001 Document Revised: 07/24/2013 Document Reviewed: 12/14/2010 ExitCare Patient Information 2015 [employer]. This information is not intended to replace advice given to you by your health care provider. Make sure you discuss any questions you have with your health care provider.    

## 2016-01-05 NOTE — Progress Notes (Signed)
Pre visit review using our clinic review tool, if applicable. No additional management support is needed unless otherwise documented below in the visit note. 

## 2016-01-05 NOTE — Assessment & Plan Note (Addendum)
Td -- 2011; Pneumonia shot 05/2007; prevnar-- 2016; reports he had a zostavax, no documentation    Colonoscopy 12-11,  (-), no further screening scopes DRE PSA 09-2014 wnl Labs : CMP, FLP, CBC Diet, exercise discussed

## 2016-01-06 NOTE — Assessment & Plan Note (Signed)
Prediabetes: Doing well with lifestyle, last A1c satisfactory HTN: Well-controlled, continue Hyzaar, refill sent Hyperlipidemia: On no medications, check a FLP Osteoporosis:  On Vit  D supplements, recheck a bone density test DJD: Refill of meloxicam Increase LFTs: Off statins and fenofibrate, workup so far negative, recheck labs. RTC 4 months

## 2016-01-12 ENCOUNTER — Telehealth: Payer: Self-pay | Admitting: Internal Medicine

## 2016-01-12 MED ORDER — MELOXICAM 7.5 MG PO TABS: 7.5000 mg | ORAL_TABLET | Freq: Every day | ORAL | Status: DC | PRN

## 2016-01-12 MED ORDER — LOSARTAN POTASSIUM-HCTZ 100-25 MG PO TABS: 1.0000 | ORAL_TABLET | Freq: Every day | ORAL | Status: DC

## 2016-01-12 NOTE — Telephone Encounter (Signed)
Rxs sent

## 2016-01-12 NOTE — Telephone Encounter (Signed)
°  Relationship to patient: Daughter Veto Kemps(Lulissa)  Can be reached: [phone]   Pharmacy:  [facility] - [place], [place] - [address] [phone] (Phone) [phone] (Fax)        Reason for call: Daughter states patient has not received meds from [facility] ordered on 6/5. States she know they are in route to him but wants to know if she can get a rx sent to the local pharm.

## 2016-01-12 NOTE — Telephone Encounter (Signed)
Caller name: Susy FrizzleMatt with [facility] Can be reached: [phone]   Reason for call: Please send in 2 week losartan/hyrochlorothiazide & 2 weeks meloxican 7.5mg  to the local [facility] [place].

## 2016-01-12 NOTE — Telephone Encounter (Signed)
For which medication?

## 2016-05-02 ENCOUNTER — Other Ambulatory Visit: Payer: Self-pay | Admitting: Internal Medicine

## 2016-05-03 NOTE — Telephone Encounter (Signed)
Pt is requesting refill on Meloxicam.  Last OV: 01/05/2016 Last Fill: 01/12/2016 #15 and 0RF  Please advise.

## 2016-05-03 NOTE — Telephone Encounter (Signed)
Ok 30 and 2 RF 

## 2016-05-03 NOTE — Telephone Encounter (Signed)
Rx sent 

## 2016-05-14 ENCOUNTER — Ambulatory Visit (INDEPENDENT_AMBULATORY_CARE_PROVIDER_SITE_OTHER): Payer: Commercial Managed Care - HMO | Admitting: Internal Medicine

## 2016-05-14 ENCOUNTER — Encounter: Payer: Self-pay | Admitting: Internal Medicine

## 2016-05-14 VITALS — BP 126/78 | HR 73 | Temp 98.3°F | Resp 14 | Ht 67.0 in | Wt 205.4 lb

## 2016-05-14 DIAGNOSIS — Z23 Encounter for immunization: Secondary | ICD-10-CM | POA: Diagnosis not present

## 2016-05-14 DIAGNOSIS — R739 Hyperglycemia, unspecified: Secondary | ICD-10-CM

## 2016-05-14 DIAGNOSIS — E785 Hyperlipidemia, unspecified: Secondary | ICD-10-CM | POA: Diagnosis not present

## 2016-05-14 DIAGNOSIS — R7989 Other specified abnormal findings of blood chemistry: Secondary | ICD-10-CM | POA: Diagnosis not present

## 2016-05-14 DIAGNOSIS — M810 Age-related osteoporosis without current pathological fracture: Secondary | ICD-10-CM

## 2016-05-14 DIAGNOSIS — R945 Abnormal results of liver function studies: Principal | ICD-10-CM

## 2016-05-14 MED ORDER — ATORVASTATIN CALCIUM 10 MG PO TABS: 10.0000 mg | ORAL_TABLET | Freq: Every day | ORAL | 3 refills | Status: DC

## 2016-05-14 NOTE — Progress Notes (Signed)
Pre visit review using our clinic review tool, if applicable. No additional management support is needed unless otherwise documented below in the visit note. 

## 2016-05-14 NOTE — Patient Instructions (Signed)
  GO TO THE FRONT DESK  Schedule labs to be done in 6 weeks. (Fasting) Schedule your next appointment for a  checkup in 4 months   Go back on Lipitor  Do not take fenofibrate.

## 2016-05-14 NOTE — Progress Notes (Signed)
Subjective:    Patient ID: Maurice Colon, male    DOB: [birth year], 82 y.o.   MRN: [id number]  DOS:  05/14/2016 Type of visit - description : Routine checkup Interval history: DM: Not very active, room for improvement on his diet Elevated LFTs: Was recommended to stop all cholesterol medications, from time to time has taken fenofibrate. Increase LFTs: Normalized last time we checked.    Review of Systems In general feels well, no fatigue. No chest pain or difficulty breathing. Some stress related to family issues.  Past Medical History:  Diagnosis Date  . DJD (degenerative joint disease)   . Hyperlipidemia    Hypertriglyceridemia  . Hypertension   . Hypogonadism, male   . Osteoporosis   . Prediabetes 07/16/2013  . Rosacea     Past Surgical History:  Procedure Laterality Date  . CATARACT EXTRACTION     Bilaterally  . INGUINAL HERNIA REPAIR      Social History   Social History  . Marital status: Married    Spouse name: N/A  . Number of children: 2  . Years of education: N/A   Occupational History  . retired   .  Retired   Social History Main Topics  . Smoking status: Never Smoker  . Smokeless tobacco: Never Used  . Alcohol use No     Comment: wine, occasionally  . Drug use: Unknown  . Sexual activity: Not on file   Other Topics Concern  . Not on file   Social History Narrative   [place]   Lives w/ wife and 1 daughter (bipolar)   Moved to [place] early 2008              Medication List       Accurate as of 05/14/16  1:28 PM. Always use your most recent med list.          aspirin 81 MG tablet Take 81 mg by mouth daily.   cholecalciferol 1000 units tablet Commonly known as:  VITAMIN D Take 1,000 Units by mouth daily. Reported on 09/16/2015   losartan-hydrochlorothiazide 100-25 MG tablet Commonly known as:  HYZAAR Take 1 tablet by mouth daily.   meloxicam 7.5 MG tablet Commonly known as:  MOBIC Take 1-2  tablets (7.5-15 mg total) by mouth daily as needed for pain.          Objective:   Physical Exam BP 126/78 (BP Location: Right Arm, Patient Position: Sitting, Cuff Size: Normal)   Pulse 73   Temp 98.3 F (36.8 C) (Oral)   Resp 14   Ht 5\' 7"  (1.702 m)   Wt 205 lb 6 oz (93.2 kg)   SpO2 98%   BMI 32.17 kg/m  General:   Well developed, well nourished . NAD.  HEENT:  Normocephalic . Face symmetric, atraumatic Lungs:  CTA B Normal respiratory effort, no intercostal retractions, no accessory muscle use. Heart: RRR,  no murmur.  No pretibial edema bilaterally  Skin: Not pale. Not jaundice Neurologic:  alert & oriented X3.  Speech normal, gait appropriate for age and unassisted Psych--  Cognition and judgment appear intact.  Cooperative with normal attention span and concentration.  Behavior appropriate. No anxious or depressed appearing.      Assessment & Plan:  Assessment > Prediabetes HTN Dyslipidemia,  high triglycerides Hypogonadism- was unable to afford HRT 2012, no sx  Osteoporosis--- 05-2012, T score -2.4. Vitamin D deficiency DJD-- meloxicam prn Rosacea Increase LFTs -- First noted ~ 04-2015,  no etoh/tylenol. changed simvastatin to Lipitor 04-2014 and Lipitor was d/c 2-17. We also d/c fenofibrates 09-2015 ; LFTs back to normal  01-2016  Fatty liver per [place] 07-18-2015 Hep B-C  Ne 07-2015;  ceruloplasmin, ferritin, iron and alpha-1 antitrypsin ---> (-) 09-2015  PLAN: Prediabetes: Check A1c in 6 weeks. Diet control HTN: Last BMP satisfactory, continue Hyzaar Increase LFTs: Fenofibrate discontinue 09-2015, subsequent LFTs normal. Has been taking some fenofibrate on and off. See next . Dyslipidemia: high triglycerides. Apparently either Lipitor or  fenofibrate were causing mild increasing LFTs. I rec to reintroduce one of them, will do Lipitor 10 mg, recheck FLP, AST, ALT in 6 weeks. No fenofibrate Osteoporosis: dexa Rx  the last time not done. Will  reorder. Hypogonadism: Asx, not on HRT. RTC 4 months

## 2016-05-16 NOTE — Assessment & Plan Note (Signed)
Prediabetes: Check A1c in 6 weeks. Diet control HTN: Last BMP satisfactory, continue Hyzaar Increase LFTs: Fenofibrate discontinue 09-2015, subsequent LFTs normal. Has been taking some fenofibrate on and off. See next . Dyslipidemia: high triglycerides. Apparently either Lipitor or  fenofibrate were causing mild increasing LFTs. I rec to reintroduce one of them, will do Lipitor 10 mg, recheck FLP, AST, ALT in 6 weeks. No fenofibrate Osteoporosis: dexa Rx  the last time not done. Will reorder. Hypogonadism: Asx, not on HRT. RTC 4 months

## 2016-05-21 ENCOUNTER — Ambulatory Visit (INDEPENDENT_AMBULATORY_CARE_PROVIDER_SITE_OTHER)
Admission: RE | Admit: 2016-05-21 | Discharge: 2016-05-21 | Disposition: A | Discharge status: Home / Self Care | Payer: Commercial Managed Care - HMO | Source: Ambulatory Visit | Attending: Internal Medicine | Admitting: Internal Medicine

## 2016-05-21 DIAGNOSIS — M810 Age-related osteoporosis without current pathological fracture: Secondary | ICD-10-CM

## 2016-06-08 DIAGNOSIS — H353131 Nonexudative age-related macular degeneration, bilateral, early dry stage: Secondary | ICD-10-CM | POA: Diagnosis not present

## 2016-06-08 DIAGNOSIS — H35373 Puckering of macula, bilateral: Secondary | ICD-10-CM | POA: Diagnosis not present

## 2016-06-08 DIAGNOSIS — H35033 Hypertensive retinopathy, bilateral: Secondary | ICD-10-CM | POA: Diagnosis not present

## 2016-06-08 DIAGNOSIS — H04123 Dry eye syndrome of bilateral lacrimal glands: Secondary | ICD-10-CM | POA: Diagnosis not present

## 2016-06-08 LAB — HM DIABETES EYE EXAM

## 2016-06-14 ENCOUNTER — Encounter: Payer: Self-pay | Admitting: Internal Medicine

## 2016-06-23 ENCOUNTER — Other Ambulatory Visit (INDEPENDENT_AMBULATORY_CARE_PROVIDER_SITE_OTHER): Payer: Commercial Managed Care - HMO

## 2016-06-23 DIAGNOSIS — R7989 Other specified abnormal findings of blood chemistry: Secondary | ICD-10-CM | POA: Diagnosis not present

## 2016-06-23 DIAGNOSIS — R739 Hyperglycemia, unspecified: Secondary | ICD-10-CM | POA: Diagnosis not present

## 2016-06-23 DIAGNOSIS — E785 Hyperlipidemia, unspecified: Secondary | ICD-10-CM | POA: Diagnosis not present

## 2016-06-23 LAB — LIPID PANEL
CHOL/HDL RATIO: 4
CHOLESTEROL: 122 mg/dL (ref 0–200)
HDL: 32.2 mg/dL — AB (ref >39.00)
NonHDL: 89.95
TRIGLYCERIDES: 225 mg/dL — AB (ref 0.0–149.0)
VLDL: 45 mg/dL — AB (ref 0.0–40.0)

## 2016-06-23 LAB — LDL CHOLESTEROL, DIRECT: LDL DIRECT: 47 mg/dL

## 2016-06-23 LAB — AST: AST: 31 U/L (ref 0–37)

## 2016-06-23 LAB — ALT: ALT: 25 U/L (ref 0–53)

## 2016-06-23 LAB — HEMOGLOBIN A1C: HEMOGLOBIN A1C: 6.9 % — AB (ref 4.6–6.5)

## 2016-06-28 DIAGNOSIS — J189 Pneumonia, unspecified organism: Secondary | ICD-10-CM | POA: Diagnosis not present

## 2016-06-28 DIAGNOSIS — I10 Essential (primary) hypertension: Secondary | ICD-10-CM | POA: Diagnosis not present

## 2016-06-28 DIAGNOSIS — J168 Pneumonia due to other specified infectious organisms: Secondary | ICD-10-CM | POA: Diagnosis not present

## 2016-07-02 ENCOUNTER — Ambulatory Visit (INDEPENDENT_AMBULATORY_CARE_PROVIDER_SITE_OTHER): Payer: Commercial Managed Care - HMO | Admitting: Internal Medicine

## 2016-07-02 ENCOUNTER — Encounter: Payer: Self-pay | Admitting: Internal Medicine

## 2016-07-02 VITALS — BP 152/80 | HR 60 | Temp 98.2°F | Resp 14 | Ht 67.0 in | Wt 204.0 lb

## 2016-07-02 DIAGNOSIS — J189 Pneumonia, unspecified organism: Secondary | ICD-10-CM

## 2016-07-02 DIAGNOSIS — I10 Essential (primary) hypertension: Secondary | ICD-10-CM | POA: Diagnosis not present

## 2016-07-02 DIAGNOSIS — J181 Lobar pneumonia, unspecified organism: Secondary | ICD-10-CM

## 2016-07-02 NOTE — Patient Instructions (Addendum)
Rest, fluids    For cough:  Take Mucinex DM twice a day as needed until better  Avoid decongestants such as  Pseudoephedrine or phenylephrine   Take the antibiotic as prescribed    Call if not gradually better over the next  10 days  Call anytime if the symptoms are severe   Please  go to the x-ray department downstairs in 4 weeks for a chest x-ray.

## 2016-07-02 NOTE — Progress Notes (Signed)
Pre visit review using our clinic review tool, if applicable. No additional management support is needed unless otherwise documented below in the visit note. 

## 2016-07-02 NOTE — Progress Notes (Signed)
Subjective:    Patient ID: Maurice Colon, male    DOB: May 07, 1933, 82 y.o.   MRN: [id number]  DOS:  07/02/2016 Type of visit - description : ER f/u Interval history: Maurice FlesherWent to the ER 06/28/2016 complaining of cough for 6 days. + Sputum. BP was 177/97, heart rate 60, O2 sat 93%. He was afebrile. No blood work done. Chest x-ray showed Faint peripheral opacity within the lateral right upper lobe which may represent infection in the proper clinical setting versus atelectasis. Loss of vertebral body height of T11 and T12, age indeterminate, though likely more chronic in age.   Prescribed doxycycline No. 20.  BP Readings from Last 3 Encounters:  07/02/16 (!) 152/80  05/14/16 126/78  01/05/16 134/70    Review of Systems No major problems with sinus congestion. Since he left the ER, he is taking Mucinex and doxycycline. He feels better. Able to sleep at night due to decreased cough. Did not have any fever chills before the ER visit. Denies chest pain, difficulty breathing, lower extremity edema No wheezing or hemoptysis  Past Medical History:  Diagnosis Date  . DJD (degenerative joint disease)   . Hyperlipidemia    Hypertriglyceridemia  . Hypertension   . Hypogonadism, male   . Osteoporosis   . Prediabetes 07/16/2013  . Rosacea     Past Surgical History:  Procedure Laterality Date  . CATARACT EXTRACTION     Bilaterally  . INGUINAL HERNIA REPAIR      Social History   Social History  . Marital status: Married    Spouse name: N/A  . Number of children: 2  . Years of education: N/A   Occupational History  . retired   .  Retired   Social History Main Topics  . Smoking status: Never Smoker  . Smokeless tobacco: Never Used  . Alcohol use No     Comment: wine, occasionally  . Drug use: Unknown  . Sexual activity: Not on file   Other Topics Concern  . Not on file   Social History Narrative   [place]   Lives w/ wife and 1 daughter  (bipolar)   Moved to [place] early 2008              Medication List       Accurate as of 07/02/16 11:59 PM. Always use your most recent med list.          aspirin 81 MG tablet Take 81 mg by mouth daily.   atorvastatin 10 MG tablet Commonly known as:  LIPITOR Take 1 tablet (10 mg total) by mouth daily.   cholecalciferol 1000 units tablet Commonly known as:  VITAMIN D Take 1,000 Units by mouth daily. Reported on 09/16/2015   doxycycline 100 MG capsule Commonly known as:  VIBRAMYCIN Take 1 capsule by mouth 2 (two) times daily.   losartan-hydrochlorothiazide 100-25 MG tablet Commonly known as:  HYZAAR Take 1 tablet by mouth daily.   meloxicam 7.5 MG tablet Commonly known as:  MOBIC Take 1-2 tablets (7.5-15 mg total) by mouth daily as needed for pain.          Objective:   Physical Exam BP (!) 152/80 (BP Location: Left Arm, Patient Position: Sitting, Cuff Size: Normal)   Pulse 60   Temp 98.2 F (36.8 C) (Oral)   Resp 14   Ht 5\' 7"  (1.702 m)   Wt 204 lb (92.5 kg)   SpO2 96%   BMI 31.95 kg/m  General:  Well developed, well nourished . NAD.  HEENT:  Normocephalic . Face symmetric, atraumatic. Throat symmetric, nose not congested Lungs:  CTA B Normal respiratory effort, no intercostal retractions, no accessory muscle use. Heart: RRR,  no murmur.  No pretibial edema bilaterally  Skin: Not pale. Not jaundice Neurologic:  alert & oriented X3.  Speech normal, gait appropriate for age and unassisted Psych--  Cognition and judgment appear intact.  Cooperative with normal attention span and concentration.  Behavior appropriate. No anxious or depressed appearing.      Assessment & Plan:   Assessment > Prediabetes HTN Dyslipidemia,  high triglycerides Hypogonadism- was unable to afford HRT 2012, no sx  Osteoporosis--- 05-2012, T score -2.4. Vitamin D deficiency DJD-- meloxicam prn Rosacea Increase LFTs -- First noted ~ 04-2015, no etoh/tylenol. changed  simvastatin to Lipitor 04-2014 and Lipitor was d/c 2-17. We also d/c fenofibrates 09-2015 ; LFTs back to normal  01-2016  Fatty liver per [place] 07-18-2015 Hep B-C  Neg 07-2015;  ceruloplasmin, ferritin, iron and alpha-1 antitrypsin ---> (-) 09-2015  PLAN: Community-acquired pneumonia: Responding well to doxycycline, vital signs are stable except for elevated BP. Continue doxycycline, call if not better, see instructions. Recheck a chest x-ray in 4 weeks . HTN: BP is usually normal but he was elevated at the ER and today. For now, recommend monitor BPs at home and call if is not back to normal. RTC already scheduled for 09/2016.

## 2016-07-04 NOTE — Assessment & Plan Note (Signed)
Community-acquired pneumonia: Responding well to doxycycline, vital signs are stable except for elevated BP. Continue doxycycline, call if not better, see instructions. Recheck a chest x-ray in 4 weeks. HTN: BP is usually normal but he was elevated at the ER and today. For now, recommend monitor BPs at home and call if is not back to normal. RTC already scheduled for 09/2016.

## 2016-07-27 ENCOUNTER — Ambulatory Visit (INDEPENDENT_AMBULATORY_CARE_PROVIDER_SITE_OTHER): Payer: Commercial Managed Care - HMO | Admitting: Internal Medicine

## 2016-07-27 ENCOUNTER — Ambulatory Visit (HOSPITAL_BASED_OUTPATIENT_CLINIC_OR_DEPARTMENT_OTHER)
Admission: RE | Admit: 2016-07-27 | Discharge: 2016-07-27 | Disposition: A | Discharge status: Home / Self Care | Payer: Commercial Managed Care - HMO | Source: Ambulatory Visit | Attending: Internal Medicine | Admitting: Internal Medicine

## 2016-07-27 ENCOUNTER — Encounter: Payer: Self-pay | Admitting: Internal Medicine

## 2016-07-27 VITALS — BP 120/72 | HR 63 | Temp 97.9°F | Resp 12 | Ht 67.0 in | Wt 206.1 lb

## 2016-07-27 DIAGNOSIS — I10 Essential (primary) hypertension: Secondary | ICD-10-CM | POA: Diagnosis not present

## 2016-07-27 DIAGNOSIS — J181 Lobar pneumonia, unspecified organism: Secondary | ICD-10-CM

## 2016-07-27 DIAGNOSIS — J189 Pneumonia, unspecified organism: Secondary | ICD-10-CM

## 2016-07-27 DIAGNOSIS — R05 Cough: Secondary | ICD-10-CM | POA: Diagnosis not present

## 2016-07-27 NOTE — Patient Instructions (Addendum)
Take a chest XR today

## 2016-07-27 NOTE — Progress Notes (Signed)
Subjective:    Patient ID: Maurice Colon, male    DOB: 05-05-1933, 82 y.o.   MRN: [id number]  DOS:  07/27/2016 Type of visit - description : acute , concerned about cough Interval history: Patient was seen here 07/02/2016 after the diagnosis of pneumonia, he was improving with antibiotics. Since that visit, symptoms resurface: Frequent cough, yellowish sputum production, eventually is only in the last 2 days that he is getting better. Was able to sleep last night without being awakened by cough.   Review of Systems Denies fever chills No chest pain or difficulty breathing No hemoptysis  Past Medical History:  Diagnosis Date  . DJD (degenerative joint disease)   . Hyperlipidemia    Hypertriglyceridemia  . Hypertension   . Hypogonadism, male   . Osteoporosis   . Prediabetes 07/16/2013  . Rosacea     Past Surgical History:  Procedure Laterality Date  . CATARACT EXTRACTION     Bilaterally  . INGUINAL HERNIA REPAIR      Social History   Social History  . Marital status: Married    Spouse name: N/A  . Number of children: 2  . Years of education: N/A   Occupational History  . retired   .  Retired   Social History Main Topics  . Smoking status: Never Smoker  . Smokeless tobacco: Never Used  . Alcohol use No     Comment: wine, occasionally  . Drug use: Unknown  . Sexual activity: Not on file   Other Topics Concern  . Not on file   Social History Narrative   [place]   Lives w/ wife and 1 daughter (bipolar)   Moved to [place] early 2008            Allergies as of 07/27/2016      Reactions   Ace Inhibitors    REACTION: cough   Amlodipine Besylate    REACTION: edema      Medication List       Accurate as of 07/27/16  2:02 PM. Always use your most recent med list.          aspirin 81 MG tablet Take 81 mg by mouth daily.   atorvastatin 10 MG tablet Commonly known as:  LIPITOR Take 1 tablet (10 mg total) by mouth  daily.   cholecalciferol 1000 units tablet Commonly known as:  VITAMIN D Take 1,000 Units by mouth daily. Reported on 09/16/2015   doxycycline 100 MG capsule Commonly known as:  VIBRAMYCIN Take 1 capsule by mouth 2 (two) times daily.   losartan-hydrochlorothiazide 100-25 MG tablet Commonly known as:  HYZAAR Take 1 tablet by mouth daily.   meloxicam 7.5 MG tablet Commonly known as:  MOBIC Take 1-2 tablets (7.5-15 mg total) by mouth daily as needed for pain.          Objective:   Physical Exam BP 120/72 (BP Location: Left Arm, Patient Position: Sitting, Cuff Size: Normal)   Pulse 63   Temp 97.9 F (36.6 C) (Oral)   Resp 12   Ht 5\' 7"  (1.702 m)   Wt 206 lb 2 oz (93.5 kg)   SpO2 97%   BMI 32.28 kg/m  General:   Well developed, well nourished . NAD.  HEENT:  Normocephalic . Face symmetric, atraumatic Lungs:  Few rhonchi, they clear after the patient coughs. Normal respiratory effort, no intercostal retractions, no accessory muscle use. Heart: RRR,  no murmur.  No pretibial edema bilaterally  Skin:  Not pale. Not jaundice Neurologic:  alert & oriented X3.  Speech normal, gait appropriate for age and unassisted Psych--  Cognition and judgment appear intact.  Cooperative with normal attention span and concentration.  Behavior appropriate. No anxious or depressed appearing.      Assessment & Plan:   Assessment > Prediabetes HTN Dyslipidemia,  high triglycerides Hypogonadism- was unable to afford HRT 2012, no sx  Osteoporosis--- 05-2012, T score -2.4. Vitamin D deficiency DJD-- meloxicam prn Rosacea Increase LFTs -- First noted ~ 04-2015, no etoh/tylenol. changed simvastatin to Lipitor 04-2014 and Lipitor was d/c 2-17. We also d/c fenofibrates 09-2015 ; LFTs back to normal  01-2016  Fatty liver per [place] 07-18-2015 Hep B-C  Neg 07-2015;  ceruloplasmin, ferritin, iron and alpha-1 antitrypsin ---> (-) 09-2015  PLAN: Community-acquired pneumonia: Since the last visit,  he cough frequently and had sputum, no fevers. Overall feels better x the last 2 days. Would proceed with a x-ray today. Otherwise continue taking Mucinex DM. HTN: BP today is excellent RTC 09-2016 as schedule

## 2016-07-27 NOTE — Progress Notes (Signed)
Pre visit review using our clinic review tool, if applicable. No additional management support is needed unless otherwise documented below in the visit note. 

## 2016-07-27 NOTE — Assessment & Plan Note (Signed)
Community-acquired pneumonia: Since the last visit, he cough frequently and had sputum, no fevers. Overall feels better x the last 2 days. Would proceed with a x-ray today. Otherwise continue taking Mucinex DM. HTN: BP today is excellent RTC 09-2016 as schedule

## 2016-08-04 ENCOUNTER — Other Ambulatory Visit: Payer: Self-pay | Admitting: Internal Medicine

## 2016-09-15 ENCOUNTER — Encounter: Payer: Self-pay | Admitting: Internal Medicine

## 2016-09-15 ENCOUNTER — Ambulatory Visit (INDEPENDENT_AMBULATORY_CARE_PROVIDER_SITE_OTHER): Payer: Medicare HMO | Admitting: Internal Medicine

## 2016-09-15 VITALS — BP 126/68 | HR 68 | Temp 98.0°F | Resp 14 | Ht 67.0 in | Wt 205.4 lb

## 2016-09-15 DIAGNOSIS — M150 Primary generalized (osteo)arthritis: Secondary | ICD-10-CM

## 2016-09-15 DIAGNOSIS — I10 Essential (primary) hypertension: Secondary | ICD-10-CM | POA: Diagnosis not present

## 2016-09-15 DIAGNOSIS — M159 Polyosteoarthritis, unspecified: Secondary | ICD-10-CM

## 2016-09-15 DIAGNOSIS — R739 Hyperglycemia, unspecified: Secondary | ICD-10-CM

## 2016-09-15 LAB — BASIC METABOLIC PANEL
BUN: 19 mg/dL (ref 6–23)
CALCIUM: 9.3 mg/dL (ref 8.4–10.5)
CHLORIDE: 103 meq/L (ref 96–112)
CO2: 30 mEq/L (ref 19–32)
CREATININE: 0.93 mg/dL (ref 0.40–1.50)
GFR: 82.8 mL/min (ref >60.00)
Glucose, Bld: 210 mg/dL — ABNORMAL HIGH (ref 70–99)
Potassium: 3.4 mEq/L — ABNORMAL LOW (ref 3.5–5.1)
Sodium: 138 mEq/L (ref 135–145)

## 2016-09-15 LAB — HEMOGLOBIN A1C: HEMOGLOBIN A1C: 6.6 % — AB (ref 4.6–6.5)

## 2016-09-15 NOTE — Progress Notes (Signed)
Pre visit review using our clinic review tool, if applicable. No additional management support is needed unless otherwise documented below in the visit note. 

## 2016-09-15 NOTE — Progress Notes (Signed)
Subjective:    Patient ID: Maurice Colon, male    DOB: [birth year], 83 y.o.   MRN: [id number]  DOS:  09/15/2016 Type of visit - description :  rov Interval history: DM: Doing well with diet and exercise HTN: Ambulatory BPs within normal Pneumonia: Symptoms completely resolved   Review of Systems  Denies chest pain or difficulty breathing Past Medical History:  Diagnosis Date  . DJD (degenerative joint disease)   . Hyperlipidemia    Hypertriglyceridemia  . Hypertension   . Hypogonadism, male   . Osteoporosis   . Prediabetes 07/16/2013  . Rosacea     Past Surgical History:  Procedure Laterality Date  . CATARACT EXTRACTION     Bilaterally  . INGUINAL HERNIA REPAIR      Social History   Social History  . Marital status: Married    Spouse name: N/A  . Number of children: 2  . Years of education: N/A   Occupational History  . retired   .  Retired   Social History Main Topics  . Smoking status: Never Smoker  . Smokeless tobacco: Never Used  . Alcohol use No     Comment: wine, occasionally  . Drug use: Unknown  . Sexual activity: Not on file   Other Topics Concern  . Not on file   Social History Narrative   [place]   Lives w/ wife and 1 daughter (bipolar)   Moved to [place] early 2008            Allergies as of 09/15/2016      Reactions   Ace Inhibitors    REACTION: cough   Amlodipine Besylate    REACTION: edema      Medication List       Accurate as of 09/15/16 11:59 PM. Always use your most recent med list.          aspirin 81 MG tablet Take 81 mg by mouth daily.   atorvastatin 10 MG tablet Commonly known as:  LIPITOR Take 1 tablet (10 mg total) by mouth daily.   cholecalciferol 1000 units tablet Commonly known as:  VITAMIN D Take 1,000 Units by mouth daily. Reported on 09/16/2015   losartan-hydrochlorothiazide 100-25 MG tablet Commonly known as:  HYZAAR Take 1 tablet by mouth daily.   meloxicam 7.5 MG  tablet Commonly known as:  MOBIC Take 1-2 tablets (7.5-15 mg total) by mouth daily as needed for pain.          Objective:   Physical Exam BP 126/68 (BP Location: Left Arm, Patient Position: Sitting, Cuff Size: Normal)   Pulse 68   Temp 98 F (36.7 C) (Oral)   Resp 14   Ht 5\' 7"  (1.702 m)   Wt 205 lb 6 oz (93.2 kg)   SpO2 96%   BMI 32.17 kg/m  General:   Well developed, well nourished . NAD.  HEENT:  Normocephalic . Face symmetric, atraumatic Lungs:  CTA B Normal respiratory effort, no intercostal retractions, no accessory muscle use. Heart: RRR,  no murmur.  No pretibial edema bilaterally  Skin: Not pale. Not jaundice Neurologic:  alert & oriented X3.  Speech normal, gait appropriate for age and unassisted Psych--  Cognition and judgment appear intact.  Cooperative with normal attention span and concentration.  Behavior appropriate. No anxious or depressed appearing.      Assessment & Plan:   Assessment > Diabetes HTN Dyslipidemia,  high triglycerides Hypogonadism- was unable to afford HRT 2012, no  sx  Osteoporosis--- 05-2012, T score -2.4. Vitamin D deficiency DJD-- meloxicam prn Rosacea Increase LFTs -- First noted ~ 04-2015, no etoh/tylenol. changed simvastatin to Lipitor 04-2014 and Lipitor was d/c 2-17. We also d/c fenofibrates 09-2015 ; LFTs back to normal  01-2016  Fatty liver per [place] 07-18-2015 Hep B-C  Neg 07-2015;  ceruloplasmin, ferritin, iron and alpha-1 antitrypsin ---> (-) 09-2015  PLAN: DM: Last A1c 6.9, has improved his diet and exercise, check a A1c HTN: Continue Hyzaar, no ambulatory BPs, BP today is very good. Check a BMP DJD: on meloxicam as needed without apparent side effects Community-acquired pneumonia:  Last cxr clear, now asx RTC CPX 6 months

## 2016-09-15 NOTE — Patient Instructions (Signed)
GO TO THE LAB : Get the blood work     GO TO THE FRONT DESK Schedule your next appointment for a physical exam in 6 months, fasting.    

## 2016-09-16 NOTE — Assessment & Plan Note (Signed)
DM: Last A1c 6.9, has improved his diet and exercise, check a A1c HTN: Continue Hyzaar, no ambulatory BPs, BP today is very good. Check a BMP DJD: on meloxicam as needed without apparent side effects Community-acquired pneumonia:  Last cxr clear, now asx RTC CPX 6 months

## 2016-09-22 ENCOUNTER — Encounter: Payer: Commercial Managed Care - HMO | Admitting: Internal Medicine

## 2016-09-22 DIAGNOSIS — Z0289 Encounter for other administrative examinations: Secondary | ICD-10-CM

## 2016-09-30 ENCOUNTER — Encounter: Payer: Self-pay | Admitting: Internal Medicine

## 2016-09-30 ENCOUNTER — Ambulatory Visit (INDEPENDENT_AMBULATORY_CARE_PROVIDER_SITE_OTHER): Payer: Medicare HMO | Admitting: Internal Medicine

## 2016-09-30 ENCOUNTER — Ambulatory Visit (HOSPITAL_BASED_OUTPATIENT_CLINIC_OR_DEPARTMENT_OTHER)
Admission: RE | Admit: 2016-09-30 | Discharge: 2016-09-30 | Disposition: A | Discharge status: Home / Self Care | Payer: Medicare HMO | Source: Ambulatory Visit | Attending: Internal Medicine | Admitting: Internal Medicine

## 2016-09-30 VITALS — BP 132/78 | HR 63 | Temp 97.8°F | Resp 14 | Ht 67.0 in | Wt 206.1 lb

## 2016-09-30 DIAGNOSIS — M19012 Primary osteoarthritis, left shoulder: Secondary | ICD-10-CM

## 2016-09-30 DIAGNOSIS — M19011 Primary osteoarthritis, right shoulder: Secondary | ICD-10-CM | POA: Diagnosis not present

## 2016-09-30 DIAGNOSIS — M25511 Pain in right shoulder: Secondary | ICD-10-CM | POA: Diagnosis not present

## 2016-09-30 NOTE — Patient Instructions (Signed)
Go to the first floor for the XR

## 2016-09-30 NOTE — Assessment & Plan Note (Signed)
DJD:  [facility] but no pain, range of motion is full, able to do all his activities and gardening. Suspect R shoulder DJD, will get a x-ray otherwise recommend observation.

## 2016-09-30 NOTE — Progress Notes (Signed)
Pre visit review using our clinic review tool, if applicable. No additional management support is needed unless otherwise documented below in the visit note. 

## 2016-09-30 NOTE — Progress Notes (Signed)
Subjective:    Patient ID: Maurice Colon, male    DOB: [birth year], 83 y.o.   MRN: [id number]  DOS:  09/30/2016 Type of visit - description : acute Interval history: Few months history of a crackling noise from the right shoulder with motion. He also noted and knot at the shoulder. He denies any pain, he is very active in his yard w/o limitations.  Review of Systems  Denies any injury recently. No shoulder surgeries. No fever chills No neck pain.  Past Medical History:  Diagnosis Date  . DJD (degenerative joint disease)   . Hyperlipidemia    Hypertriglyceridemia  . Hypertension   . Hypogonadism, male   . Osteoporosis   . Prediabetes 07/16/2013  . Rosacea     Past Surgical History:  Procedure Laterality Date  . CATARACT EXTRACTION     Bilaterally  . INGUINAL HERNIA REPAIR      Social History   Social History  . Marital status: Married    Spouse name: N/A  . Number of children: 2  . Years of education: N/A   Occupational History  . retired   .  Retired   Social History Main Topics  . Smoking status: Never Smoker  . Smokeless tobacco: Never Used  . Alcohol use No     Comment: wine, occasionally  . Drug use: Unknown  . Sexual activity: Not on file   Other Topics Concern  . Not on file   Social History Narrative   [place]   Lives w/ wife and 1 daughter (bipolar)   Moved to [place] early 2008            Allergies as of 09/30/2016      Reactions   Ace Inhibitors    REACTION: cough   Amlodipine Besylate    REACTION: edema      Medication List       Accurate as of 09/30/16 10:54 AM. Always use your most recent med list.          aspirin 81 MG tablet Take 81 mg by mouth daily.   atorvastatin 10 MG tablet Commonly known as:  LIPITOR Take 1 tablet (10 mg total) by mouth daily.   cholecalciferol 1000 units tablet Commonly known as:  VITAMIN D Take 1,000 Units by mouth daily. Reported on 09/16/2015     losartan-hydrochlorothiazide 100-25 MG tablet Commonly known as:  HYZAAR Take 1 tablet by mouth daily.   meloxicam 7.5 MG tablet Commonly known as:  MOBIC Take 1-2 tablets (7.5-15 mg total) by mouth daily as needed for pain.          Objective:   Physical Exam BP 132/78 (BP Location: Left Arm, Patient Position: Sitting, Cuff Size: Normal)   Pulse 63   Temp 97.8 F (36.6 C) (Oral)   Resp 14   Ht 5\' 7"  (1.702 m)   Wt 206 lb 2 oz (93.5 kg)   SpO2 95%   BMI 32.28 kg/m  General:   Well developed, well nourished . NAD.  HEENT:  Normocephalic . Face symmetric, atraumatic MSK: Left shoulder normal Right shoulder: Prominent AC joint, area is no red, swollen or TTP. Range of motion of B shoulder is symmetric and full . Skin: Not pale. Not jaundice Neurologic:  alert & oriented X3.  Speech normal, gait appropriate for age and unassisted Psych--  Cognition and judgment appear intact.  Cooperative with normal attention span and concentration.  Behavior appropriate. No anxious or depressed  appearing.      Assessment & Plan:   Assessment > Diabetes HTN Dyslipidemia,  high triglycerides Hypogonadism- was unable to afford HRT 2012, no sx  Osteoporosis--- 05-2012, T score -2.4. Vitamin D deficiency DJD-- meloxicam prn Rosacea Increase LFTs -- First noted ~ 04-2015, no etoh/tylenol. changed simvastatin to Lipitor 04-2014 and Lipitor was d/c 2-17. We also d/c fenofibrates 09-2015 ; LFTs back to normal  01-2016  Fatty liver per [place] 07-18-2015 Hep B-C  Neg 07-2015;  ceruloplasmin, ferritin, iron and alpha-1 antitrypsin ---> (-) 09-2015  PLAN: DJD:  [facility] but no pain, range of motion is full, able to do all his activities and gardening. Suspect R shoulder DJD, will get a x-ray otherwise recommend observation.

## 2016-10-18 DIAGNOSIS — R69 Illness, unspecified: Secondary | ICD-10-CM | POA: Diagnosis not present

## 2016-10-19 ENCOUNTER — Telehealth: Payer: Self-pay | Admitting: Internal Medicine

## 2016-10-19 DIAGNOSIS — E785 Hyperlipidemia, unspecified: Secondary | ICD-10-CM

## 2016-10-19 MED ORDER — ATORVASTATIN CALCIUM 10 MG PO TABS: 10.0000 mg | ORAL_TABLET | Freq: Every day | ORAL | 2 refills | Status: DC

## 2016-10-19 MED ORDER — LOSARTAN POTASSIUM-HCTZ 100-25 MG PO TABS: 1.0000 | ORAL_TABLET | Freq: Every day | ORAL | 2 refills | Status: DC

## 2016-10-19 NOTE — Telephone Encounter (Signed)
Caller name: Hardie PulleyMiguel Colon Relationship to patient: son Can be reached: [phone] Pharmacy: [facility] [place] - [id number]  Reason for call: Best contact person is son MattydaleMiguel. Pt phone is not working and pt and wife primarily speak spanish. Pt needing refills on fenofibrate, atorvastatin, and losartan.

## 2016-10-19 NOTE — Telephone Encounter (Signed)
Agree, he has not been on fenofibrate for a while , let the patient's son know he does  not need to take it .

## 2016-10-19 NOTE — Telephone Encounter (Signed)
Pt's son called, requesting refill on meds. Requesting fenofibrate: do not see on med list. Please advise?

## 2016-10-19 NOTE — Telephone Encounter (Signed)
LMOM informing Pt's son, Maurice Colon that meds have been refilled, however, per PCP and chart Pt is no longer on fenofibrate. Instructed to call if questions/concerns.

## 2016-11-05 ENCOUNTER — Other Ambulatory Visit: Payer: Self-pay | Admitting: Internal Medicine

## 2016-11-05 DIAGNOSIS — E785 Hyperlipidemia, unspecified: Secondary | ICD-10-CM

## 2016-11-08 ENCOUNTER — Other Ambulatory Visit: Payer: Self-pay | Admitting: Internal Medicine

## 2016-11-09 DIAGNOSIS — R51 Headache: Secondary | ICD-10-CM | POA: Diagnosis not present

## 2016-11-09 DIAGNOSIS — R9431 Abnormal electrocardiogram [ECG] [EKG]: Secondary | ICD-10-CM | POA: Diagnosis not present

## 2016-11-09 DIAGNOSIS — Z791 Long term (current) use of non-steroidal anti-inflammatories (NSAID): Secondary | ICD-10-CM | POA: Diagnosis not present

## 2016-11-09 DIAGNOSIS — M791 Myalgia: Secondary | ICD-10-CM | POA: Diagnosis not present

## 2016-11-09 DIAGNOSIS — Z7982 Long term (current) use of aspirin: Secondary | ICD-10-CM | POA: Diagnosis not present

## 2016-11-09 DIAGNOSIS — Z79899 Other long term (current) drug therapy: Secondary | ICD-10-CM | POA: Diagnosis not present

## 2016-11-09 DIAGNOSIS — I10 Essential (primary) hypertension: Secondary | ICD-10-CM | POA: Diagnosis not present

## 2016-11-09 DIAGNOSIS — E785 Hyperlipidemia, unspecified: Secondary | ICD-10-CM | POA: Diagnosis not present

## 2016-11-09 DIAGNOSIS — J40 Bronchitis, not specified as acute or chronic: Secondary | ICD-10-CM | POA: Diagnosis not present

## 2016-11-09 DIAGNOSIS — R05 Cough: Secondary | ICD-10-CM | POA: Diagnosis not present

## 2016-11-09 DIAGNOSIS — R42 Dizziness and giddiness: Secondary | ICD-10-CM | POA: Diagnosis not present

## 2016-11-11 ENCOUNTER — Ambulatory Visit (INDEPENDENT_AMBULATORY_CARE_PROVIDER_SITE_OTHER): Payer: Medicare HMO | Admitting: Internal Medicine

## 2016-11-11 ENCOUNTER — Encounter: Payer: Self-pay | Admitting: Internal Medicine

## 2016-11-11 VITALS — BP 128/70 | HR 68 | Temp 97.9°F | Resp 14 | Ht 67.0 in | Wt 202.2 lb

## 2016-11-11 DIAGNOSIS — B349 Viral infection, unspecified: Secondary | ICD-10-CM | POA: Diagnosis not present

## 2016-11-11 NOTE — Progress Notes (Signed)
Pre visit review using our clinic review tool, if applicable. No additional management support is needed unless otherwise documented below in the visit note. 

## 2016-11-11 NOTE — Progress Notes (Signed)
Subjective:    Patient ID: Maurice Colon, male    DOB: 1933/05/17, 83 y.o.   MRN: [id number]  DOS:  11/11/2016 Type of visit - description : er f/u Interval history: Symptoms started 5 days ago with chills, malaise, the next day he got generalized moderate aches and pains and subjective fever. Denies headache, rash. No nausea, vomiting, diarrhea. Actually denies cough to me. Went to the ER 11-09-2016, records reviewed (unable to find the M.D. notes) Urinalysis - flu test were negative. White count not elevated. CMP satisfactory, Mg 1.4 slightly low. Chest x-ray negative Was DX with bronchitis, prescribed albuterol and doxycycline.  Review of Systems  Since she he left the ER is feeling better, aches quickly improved, he is taking the medications as prescribed.  Past Medical History:  Diagnosis Date  . DJD (degenerative joint disease)   . Hyperlipidemia    Hypertriglyceridemia  . Hypertension   . Hypogonadism, male   . Osteoporosis   . Prediabetes 07/16/2013  . Rosacea     Past Surgical History:  Procedure Laterality Date  . CATARACT EXTRACTION     Bilaterally  . INGUINAL HERNIA REPAIR      Social History   Social History  . Marital status: Married    Spouse name: N/A  . Number of children: 2  . Years of education: N/A   Occupational History  . retired   .  Retired   Social History Main Topics  . Smoking status: Never Smoker  . Smokeless tobacco: Never Used  . Alcohol use No     Comment: wine, occasionally  . Drug use: Unknown  . Sexual activity: Not on file   Other Topics Concern  . Not on file   Social History Narrative   [place]   Lives w/ wife and 1 daughter (bipolar)   Moved to [place] early 2008            Allergies as of 11/11/2016      Reactions   Ace Inhibitors    REACTION: cough   Amlodipine Besylate    REACTION: edema      Medication List       Accurate as of 11/11/16 11:59 PM. Always use your most recent  med list.          albuterol 108 (90 Base) MCG/ACT inhaler Commonly known as:  PROVENTIL HFA;VENTOLIN HFA Inhale 1-2 puffs into the lungs every 6 (six) hours as needed.   aspirin 81 MG tablet Take 81 mg by mouth daily.   atorvastatin 10 MG tablet Commonly known as:  LIPITOR Take 1 tablet (10 mg total) by mouth daily.   cholecalciferol 1000 units tablet Commonly known as:  VITAMIN D Take 1,000 Units by mouth daily. Reported on 09/16/2015   doxycycline 100 MG tablet Commonly known as:  VIBRA-TABS Take 1 tablet by mouth 2 (two) times daily.   losartan-hydrochlorothiazide 100-25 MG tablet Commonly known as:  HYZAAR Take 1 tablet by mouth daily.   meloxicam 7.5 MG tablet Commonly known as:  MOBIC Take 1-2 tablets (7.5-15 mg total) by mouth daily as needed for pain.          Objective:   Physical Exam BP 128/70 (BP Location: Right Arm, Patient Position: Sitting, Cuff Size: Normal)   Pulse 68   Temp 97.9 F (36.6 C) (Oral)   Resp 14   Ht  (1.702 m)   Wt 202 lb 4 oz (91.7 kg)   SpO2 97%  BMI 31.68 kg/m  General:   Well developed, well nourished . NAD.  HEENT:  Normocephalic . Face symmetric, atraumatic. Throat symmetric. Nose not congested Lungs:  Few  rhonchi if any, no wheezing or crackles, Normal respiratory effort, no intercostal retractions, no accessory muscle use. Heart: RRR,  no murmur.  No pretibial edema bilaterally  Skin: Not pale. Not jaundice Neurologic:  alert & oriented X3.  Speech normal, gait appropriate for age and unassisted Psych--  Cognition and judgment appear intact.  Cooperative with normal attention span and concentration.  Behavior appropriate. No anxious or depressed appearing.      Assessment & Plan:   Assessment  Diabetes HTN Dyslipidemia,  high triglycerides Hypogonadism- was unable to afford HRT 2012, no sx  Osteoporosis--- 05-2012, T score -2.4. Vitamin D deficiency DJD-- meloxicam prn Rosacea Increase LFTs  -- First noted ~ 04-2015, no etoh/tylenol. changed simvastatin to Lipitor 04-2014 and Lipitor was d/c 2-17. We also d/c fenofibrates 09-2015 ; LFTs back to normal  01-2016  Fatty liver per [place] 07-18-2015 Hep B-C  Neg 07-2015;  ceruloplasmin, ferritin, iron and alpha-1 antitrypsin ---> (-) 09-2015  PLAN: Viral syndrome: sx c/w viral syndrome, @ the ER was rx doxycycline and albuterol, I don't see the physician notes in CareEverywhere,  is possible that he was wheezing or having rhonchi and thus was rx abx (he denies cough to me). Plan: Finish antibiotics, albuterol as needed only otherwise conservative treatment with Tylenol, fluids, Mucinex DM.

## 2016-11-11 NOTE — Patient Instructions (Signed)
Finish the antibiotics  Use  albuterol only if you are coughing, wheezing.  Drink plenty of fluids and use Tylenol as needed  Mucinex DM  Call if symptoms resurface or you get worse.

## 2016-11-12 NOTE — Assessment & Plan Note (Signed)
Viral syndrome: sx c/w viral syndrome, @ the ER was rx doxycycline and albuterol, I don't see the physician notes in CareEverywhere,  is possible that he was wheezing or having rhonchi and thus was rx abx (he denies cough to me). Plan: Finish antibiotics, albuterol as needed only otherwise conservative treatment with Tylenol, fluids, Mucinex DM.

## 2017-01-25 ENCOUNTER — Other Ambulatory Visit: Payer: Self-pay | Admitting: Internal Medicine

## 2017-03-15 ENCOUNTER — Encounter: Payer: Self-pay | Admitting: Internal Medicine

## 2017-03-15 ENCOUNTER — Ambulatory Visit (INDEPENDENT_AMBULATORY_CARE_PROVIDER_SITE_OTHER): Payer: Medicare HMO | Admitting: Internal Medicine

## 2017-03-15 VITALS — BP 142/70 | HR 58 | Temp 97.8°F | Resp 14 | Ht 67.0 in | Wt 203.2 lb

## 2017-03-15 DIAGNOSIS — E785 Hyperlipidemia, unspecified: Secondary | ICD-10-CM

## 2017-03-15 DIAGNOSIS — E118 Type 2 diabetes mellitus with unspecified complications: Secondary | ICD-10-CM | POA: Diagnosis not present

## 2017-03-15 DIAGNOSIS — I10 Essential (primary) hypertension: Secondary | ICD-10-CM | POA: Diagnosis not present

## 2017-03-15 LAB — CBC WITH DIFFERENTIAL/PLATELET
Basophils Absolute: 0 10*3/uL (ref 0.0–0.1)
Basophils Relative: 0.8 % (ref 0.0–3.0)
EOS ABS: 0.2 10*3/uL (ref 0.0–0.7)
Eosinophils Relative: 2.8 % (ref 0.0–5.0)
HCT: 41.6 % (ref 39.0–52.0)
HEMOGLOBIN: 14.3 g/dL (ref 13.0–17.0)
Lymphocytes Relative: 34.1 % (ref 12.0–46.0)
Lymphs Abs: 1.9 10*3/uL (ref 0.7–4.0)
MCHC: 34.3 g/dL (ref 30.0–36.0)
MCV: 91 fl (ref 78.0–100.0)
MONO ABS: 0.5 10*3/uL (ref 0.1–1.0)
Monocytes Relative: 9.4 % (ref 3.0–12.0)
Neutro Abs: 2.9 10*3/uL (ref 1.4–7.7)
Neutrophils Relative %: 52.9 % (ref 43.0–77.0)
Platelets: 242 10*3/uL (ref 150.0–400.0)
RBC: 4.57 Mil/uL (ref 4.22–5.81)
RDW: 13.9 % (ref 11.5–15.5)
WBC: 5.6 10*3/uL (ref 4.0–10.5)

## 2017-03-15 LAB — BASIC METABOLIC PANEL
BUN: 14 mg/dL (ref 6–23)
CALCIUM: 10.8 mg/dL — AB (ref 8.4–10.5)
CO2: 29 mEq/L (ref 19–32)
Chloride: 101 mEq/L (ref 96–112)
Creatinine, Ser: 0.83 mg/dL (ref 0.40–1.50)
GFR: 94.3 mL/min (ref >60.00)
Glucose, Bld: 193 mg/dL — ABNORMAL HIGH (ref 70–99)
Potassium: 3.3 mEq/L — ABNORMAL LOW (ref 3.5–5.1)
SODIUM: 135 meq/L (ref 135–145)

## 2017-03-15 LAB — ALT: ALT: 24 U/L (ref 0–53)

## 2017-03-15 LAB — HEMOGLOBIN A1C: HEMOGLOBIN A1C: 7.2 % — AB (ref 4.6–6.5)

## 2017-03-15 LAB — AST: AST: 31 U/L (ref 0–37)

## 2017-03-15 NOTE — Patient Instructions (Signed)
GO TO THE LAB : Get the blood work     GO TO THE FRONT DESK Schedule your next appointment for a  complete physical exam, fasting in 4-5 months.  Schedule a Medicare wellness with one of our nurses at your convenience   Check the  blood pressure   weekly   Be sure your blood pressure is between 110/65 and  145/85. If it is consistently higher or lower, let me know

## 2017-03-15 NOTE — Progress Notes (Signed)
Subjective:    Patient ID: Maurice Colon, male    DOB: 08-12-1932, 83 y.o.   MRN: [id number]  DOS:  03/15/2017 Type of visit - description : rov Interval history: HTN, good compliance with medication, ambulatory BPs range from 120-140. High cholesterol: Good compliance with Lipitor, no apparent side effects.  Review of Systems Denies chest pain or difficulty breathing. No lower extremity edema.  Past Medical History:  Diagnosis Date  . DJD (degenerative joint disease)   . Hyperlipidemia    Hypertriglyceridemia  . Hypertension   . Hypogonadism, male   . Osteoporosis   . Prediabetes 07/16/2013  . Rosacea     Past Surgical History:  Procedure Laterality Date  . CATARACT EXTRACTION     Bilaterally  . INGUINAL HERNIA REPAIR      Social History   Social History  . Marital status: Married    Spouse name: N/A  . Number of children: 2  . Years of education: N/A   Occupational History  . retired   .  Retired   Social History Main Topics  . Smoking status: Never Smoker  . Smokeless tobacco: Never Used  . Alcohol use No     Comment: wine, occasionally  . Drug use: Unknown  . Sexual activity: Not on file   Other Topics Concern  . Not on file   Social History Narrative   [place]   Lives w/ wife and 1 daughter (bipolar)   Moved to [place] early 2008            Allergies as of 03/15/2017      Reactions   Ace Inhibitors    REACTION: cough   Amlodipine Besylate    REACTION: edema      Medication List       Accurate as of 03/15/17 11:59 PM. Always use your most recent med list.          aspirin 81 MG tablet Take 81 mg by mouth daily.   atorvastatin 10 MG tablet Commonly known as:  LIPITOR Take 1 tablet (10 mg total) by mouth daily.   cholecalciferol 1000 units tablet Commonly known as:  VITAMIN D Take 1,000 Units by mouth daily. Reported on 09/16/2015   losartan-hydrochlorothiazide 100-25 MG tablet Commonly known as:   HYZAAR Take 1 tablet by mouth daily.   meloxicam 7.5 MG tablet Commonly known as:  MOBIC Take 1-2 tablets (7.5-15 mg total) by mouth daily as needed for pain.          Objective:   Physical Exam BP (!) 142/70 (BP Location: Left Arm, Patient Position: Sitting, Cuff Size: Small)   Pulse (!) 58   Temp 97.8 F (36.6 C) (Oral)   Resp 14   Ht 5\' 7"  (1.702 m)   Wt 203 lb 4 oz (92.2 kg)   SpO2 97%   BMI 31.83 kg/m  General:   Well developed, well nourished . NAD.  HEENT:  Normocephalic . Face symmetric, atraumatic Lungs:  CTA B Normal respiratory effort, no intercostal retractions, no accessory muscle use. Heart: RRR,  no murmur.  No pretibial edema bilaterally  Skin: Not pale. Not jaundice Neurologic:  alert & oriented X3.  Speech normal, gait appropriate for age and unassisted Psych--  Cognition and judgment appear intact.  Cooperative with normal attention span and concentration.  Behavior appropriate. No anxious or depressed appearing.      Assessment & Plan:   Assessment  Diabetes HTN Dyslipidemia,  high triglycerides  Hypogonadism- was unable to afford HRT 2012, no sx  Osteoporosis: T score -2.4  (05-2012). T score -2.3 (05-2016): Rx Ca, vitamin D Vitamin D deficiency DJD-- meloxicam prn Rosacea Increase LFTs -- First noted ~ 04-2015, no etoh/tylenol. changed simvastatin to Lipitor 04-2014 and Lipitor was d/c 2-17. We also d/c fenofibrates 09-2015 ; LFTs back to normal  01-2016  Fatty liver per [place] 07-18-2015 Hep B-C  Neg 07-2015;  ceruloplasmin, ferritin, iron and alpha-1 antitrypsin ---> (-) 09-2015  PLAN: DM: Continue with diet control, check a A1c. HTN: Ambulatory BPs never more than 140, continue Hyzaar. Check a BMP and CBC High cholesterol: Well-controlled on Lipitor, check LFTs. RTC, yearly checkup in 4-5 months. Recommend a Medicare wellness exam at his convenience

## 2017-03-15 NOTE — Progress Notes (Signed)
Pre visit review using our clinic review tool, if applicable. No additional management support is needed unless otherwise documented below in the visit note. 

## 2017-03-16 MED ORDER — POTASSIUM CHLORIDE ER 10 MEQ PO TBCR: 10.0000 meq | EXTENDED_RELEASE_TABLET | Freq: Every day | ORAL | 1 refills | Status: DC

## 2017-03-16 NOTE — Assessment & Plan Note (Addendum)
DM: Continue with diet control, check a A1c. HTN: Ambulatory BPs never more than 140, continue Hyzaar. Check a BMP and CBC High cholesterol: Well-controlled on Lipitor, check LFTs. RTC, yearly checkup in 4-5 months. Recommend a Medicare wellness exam at his convenience

## 2017-03-16 NOTE — Addendum Note (Signed)
Addended byConrad [place]: Donielle Radziewicz D on: 03/16/2017 02:18 PM   Modules accepted: Orders

## 2017-04-07 ENCOUNTER — Other Ambulatory Visit (INDEPENDENT_AMBULATORY_CARE_PROVIDER_SITE_OTHER): Payer: Medicare HMO

## 2017-04-07 DIAGNOSIS — I10 Essential (primary) hypertension: Secondary | ICD-10-CM

## 2017-04-07 LAB — BASIC METABOLIC PANEL
BUN: 12 mg/dL (ref 6–23)
CALCIUM: 9.8 mg/dL (ref 8.4–10.5)
CO2: 26 mEq/L (ref 19–32)
Chloride: 103 mEq/L (ref 96–112)
Creatinine, Ser: 0.97 mg/dL (ref 0.40–1.50)
GFR: 78.77 mL/min (ref >60.00)
GLUCOSE: 120 mg/dL — AB (ref 70–99)
POTASSIUM: 3.6 meq/L (ref 3.5–5.1)
SODIUM: 139 meq/L (ref 135–145)

## 2017-04-11 MED ORDER — POTASSIUM CHLORIDE ER 10 MEQ PO TBCR
10.0000 meq | EXTENDED_RELEASE_TABLET | Freq: Every day | ORAL | 1 refills | Status: DC
Start: 2017-04-11 — End: 2017-12-12

## 2017-04-11 NOTE — Addendum Note (Signed)
Addended byConrad [place]: Arman Loy D on: 04/11/2017 10:45 AM   Modules accepted: Orders

## 2017-05-03 ENCOUNTER — Other Ambulatory Visit: Payer: Self-pay | Admitting: Internal Medicine

## 2017-06-17 ENCOUNTER — Ambulatory Visit: Payer: Medicare HMO | Admitting: Internal Medicine

## 2017-06-17 ENCOUNTER — Encounter: Payer: Self-pay | Admitting: Internal Medicine

## 2017-06-17 VITALS — BP 144/88 | HR 72 | Temp 97.8°F | Resp 14 | Ht 67.0 in | Wt 208.0 lb

## 2017-06-17 DIAGNOSIS — B86 Scabies: Secondary | ICD-10-CM | POA: Diagnosis not present

## 2017-06-17 DIAGNOSIS — Z23 Encounter for immunization: Secondary | ICD-10-CM

## 2017-06-17 MED ORDER — PERMETHRIN 1 % EX LOTN: 1.0000 "application " | TOPICAL_LOTION | Freq: Once | CUTANEOUS | 0 refills | Status: AC

## 2017-06-17 NOTE — Progress Notes (Signed)
Pre visit review using our clinic review tool, if applicable. No additional management support is needed unless otherwise documented below in the visit note. 

## 2017-06-17 NOTE — Patient Instructions (Signed)
Apply the lotion one time before you go to sleep, take a shower the next day.

## 2017-06-17 NOTE — Progress Notes (Signed)
Subjective:    Patient ID: Maurice Colon, male    DOB: 1933/07/11, 83 y.o.   MRN: [id number]  DOS:  06/17/2017 Type of visit - description : Acute Interval history: Daughter diagnosed and treated for scabies last week at the dermatology.  Patient was told to get checked can be treated.   Review of Systems Denies fever, chills. No rash, itching anywhere.  Past Medical History:  Diagnosis Date  . DJD (degenerative joint disease)   . Hyperlipidemia    Hypertriglyceridemia  . Hypertension   . Hypogonadism, male   . Osteoporosis   . Prediabetes 07/16/2013  . Rosacea     Past Surgical History:  Procedure Laterality Date  . CATARACT EXTRACTION     Bilaterally  . INGUINAL HERNIA REPAIR      Social History   Socioeconomic History  . Marital status: Married    Spouse name: Not on file  . Number of children: 2  . Years of education: Not on file  . Highest education level: Not on file  Social Needs  . Financial resource strain: Not on file  . Food insecurity - worry: Not on file  . Food insecurity - inability: Not on file  . Transportation needs - medical: Not on file  . Transportation needs - non-medical: Not on file  Occupational History  . Occupation: retired    Associate Professormployer: RETIRED  Tobacco Use  . Smoking status: Never Smoker  . Smokeless tobacco: Never Used  Substance and Sexual Activity  . Alcohol use: No    Comment: wine, occasionally  . Drug use: Not on file  . Sexual activity: Not on file  Other Topics Concern  . Not on file  Social History Narrative   [place]   Lives w/ wife and 1 daughter (bipolar)   Moved to [place] early 2008         Allergies as of 06/17/2017      Reactions   Ace Inhibitors    REACTION: cough   Amlodipine Besylate    REACTION: edema      Medication List        Accurate as of 06/17/17  6:07 PM. Always use your most recent med list.          aspirin 81 MG tablet Take 81 mg by mouth daily.   atorvastatin 10 MG tablet Commonly known as:  LIPITOR Take 1 tablet (10 mg total) by mouth daily.   cholecalciferol 1000 units tablet Commonly known as:  VITAMIN D Take 1,000 Units by mouth daily. Reported on 09/16/2015   losartan-hydrochlorothiazide 100-25 MG tablet Commonly known as:  HYZAAR Take 1 tablet by mouth daily.   meloxicam 7.5 MG tablet Commonly known as:  MOBIC Take 1-2 tablets (7.5-15 mg total) by mouth daily as needed for pain.   permethrin 1 % lotion Commonly known as:  ELIMITE Apply 1 application once for 1 dose topically. Shampoo, rinse and towel dry hair, saturate hair and scalp with permethrin. Rinse after 10 min; repeat in 1 week if needed   potassium chloride 10 MEQ tablet Commonly known as:  K-DUR Take 1 tablet (10 mEq total) by mouth daily.          Objective:   Physical Exam BP (!) 144/88 (BP Location: Right Arm, Patient Position: Sitting, Cuff Size: Small)   Pulse 72   Temp 97.8 F (36.6 C) (Oral)   Resp 14   Ht 5\' 7"  (1.702 m)   Wt 208  lb (94.3 kg)   SpO2 97%   BMI 32.58 kg/m  General:   Well developed, well nourished . NAD.  HEENT:  Normocephalic . Face symmetric, atraumatic Skin: Hands, wrists, neck and abdomen: Free of rashes Neurologic:  alert & oriented X3.  Speech normal, gait appropriate for age and unassisted Psych--  Cognition and judgment appear intact.  Cooperative with normal attention span and concentration.  Behavior appropriate. No anxious or depressed appearing.      Assessment & Plan:  Assessment  Diabetes HTN Dyslipidemia,  high triglycerides Hypogonadism- was unable to afford HRT 2012, no sx  Osteoporosis: T score -2.4  (05-2012). T score -2.3 (05-2016): Rx Ca, vitamin D Vitamin D deficiency DJD-- meloxicam prn Rosacea Increase LFTs -- First noted ~ 04-2015, no etoh/tylenol. changed simvastatin to Lipitor 04-2014 and Lipitor was d/c 2-17. We also d/c fenofibrates 09-2015 ; LFTs back to normal  01-2016  Fatty  liver per [place] 07-18-[place] Hep B-C  Neg 07-2015;  ceruloplasmin, ferritin, iron and alpha-1 antitrypsin ---> (-) 09-2015  PLAN: Suspected scabies, scabies exposure: Patient has been exposed to scabies by her daughter, they live in the same house and are in close contact, per "up-to-date" is recommended to treat contacts.  We also discussed the option to wait and see, eventually decided to treat.  Rx for permethrin issued, how to use it discussed ; hygienic measures also discussed. Also, request a flu shot.

## 2017-06-17 NOTE — Assessment & Plan Note (Signed)
Suspected scabies, scabies exposure: Patient has been exposed to scabies by her daughter, they live in the same house and are in close contact, per "up-to-date" is recommended to treat contacts.  We also discussed the option to wait and see, eventually decided to treat.  Rx for permethrin issued, how to use it discussed ; hygienic measures also discussed. Also, request a flu shot.

## 2017-06-28 DIAGNOSIS — H35373 Puckering of macula, bilateral: Secondary | ICD-10-CM | POA: Diagnosis not present

## 2017-06-28 DIAGNOSIS — H04123 Dry eye syndrome of bilateral lacrimal glands: Secondary | ICD-10-CM | POA: Diagnosis not present

## 2017-06-28 DIAGNOSIS — H353131 Nonexudative age-related macular degeneration, bilateral, early dry stage: Secondary | ICD-10-CM | POA: Diagnosis not present

## 2017-07-04 ENCOUNTER — Encounter: Payer: Self-pay | Admitting: Internal Medicine

## 2017-07-04 ENCOUNTER — Ambulatory Visit: Payer: Medicare HMO | Admitting: Internal Medicine

## 2017-07-04 VITALS — BP 142/70 | HR 81 | Temp 98.1°F | Resp 14 | Ht 67.0 in | Wt 204.2 lb

## 2017-07-04 DIAGNOSIS — B349 Viral infection, unspecified: Secondary | ICD-10-CM

## 2017-07-04 MED ORDER — OSELTAMIVIR PHOSPHATE 75 MG PO CAPS: 75.0000 mg | ORAL_CAPSULE | Freq: Two times a day (BID) | ORAL | 0 refills | Status: DC

## 2017-07-04 NOTE — Progress Notes (Signed)
Subjective:    Patient ID: Maurice Colon, male    DOB: 04/30/1933, 83 y.o.   MRN: [id number]  DOS:  07/04/2017 Type of visit - description : acute Interval history: Symptoms started yesterday afternoon with persistent chills, muscle aches and cough. He is coughing yellow sputum. Is taking OTC Tylenol with some help. Denies any sick contacts   Review of Systems + Subjective fever No nausea, vomiting, diarrhea.  No abdominal pain. No dysuria or gross hematuria No rash Mild headache  Past Medical History:  Diagnosis Date  . DJD (degenerative joint disease)   . Hyperlipidemia    Hypertriglyceridemia  . Hypertension   . Hypogonadism, male   . Osteoporosis   . Prediabetes 07/16/2013  . Rosacea     Past Surgical History:  Procedure Laterality Date  . CATARACT EXTRACTION     Bilaterally  . INGUINAL HERNIA REPAIR      Social History   Socioeconomic History  . Marital status: Married    Spouse name: Not on file  . Number of children: 2  . Years of education: Not on file  . Highest education level: Not on file  Social Needs  . Financial resource strain: Not on file  . Food insecurity - worry: Not on file  . Food insecurity - inability: Not on file  . Transportation needs - medical: Not on file  . Transportation needs - non-medical: Not on file  Occupational History  . Occupation: retired    Associate Professormployer: RETIRED  Tobacco Use  . Smoking status: Never Smoker  . Smokeless tobacco: Never Used  Substance and Sexual Activity  . Alcohol use: No    Comment: wine, occasionally  . Drug use: Not on file  . Sexual activity: Not on file  Other Topics Concern  . Not on file  Social History Narrative   [place]   Lives w/ wife and 1 daughter (bipolar)   Moved to [place] early 2008         Allergies as of 07/04/2017      Reactions   Ace Inhibitors    REACTION: cough   Amlodipine Besylate    REACTION: edema      Medication List        Accurate as of 07/04/17  7:00 PM. Always use your most recent med list.          aspirin 81 MG tablet Take 81 mg by mouth daily.   atorvastatin 10 MG tablet Commonly known as:  LIPITOR Take 1 tablet (10 mg total) by mouth daily.   cholecalciferol 1000 units tablet Commonly known as:  VITAMIN D Take 1,000 Units by mouth daily. Reported on 09/16/2015   losartan-hydrochlorothiazide 100-25 MG tablet Commonly known as:  HYZAAR Take 1 tablet by mouth daily.   meloxicam 7.5 MG tablet Commonly known as:  MOBIC Take 1-2 tablets (7.5-15 mg total) by mouth daily as needed for pain.   oseltamivir 75 MG capsule Commonly known as:  TAMIFLU Take 1 capsule (75 mg total) by mouth 2 (two) times daily.   potassium chloride 10 MEQ tablet Commonly known as:  K-DUR Take 1 tablet (10 mEq total) by mouth daily.          Objective:   Physical Exam BP (!) 142/70 (BP Location: Right Arm, Patient Position: Sitting, Cuff Size: Small)   Pulse 81   Temp 98.1 F (36.7 C) (Oral)   Resp 14   Ht 5\' 7"  (1.702 m)   Wt  204 lb 4 oz (92.6 kg)   SpO2 93%   BMI 31.99 kg/m  General:   Well developed, well nourished . NAD.  VSS HEENT:  Normocephalic . Face symmetric, atraumatic. TMs normal, throat symmetric and not red.  Nose congested, sinuses non-TTP. Lungs:  CTA B Normal respiratory effort, no intercostal retractions, no accessory muscle use. Heart: RRR,  no murmur.  No pretibial edema bilaterally  Abdomen: Soft, nontender. Skin: Not pale. Not jaundice Neurologic:  alert & oriented X3.  Speech normal, gait appropriate for age and unassisted Psych--  Cognition and judgment appear intact.  Cooperative with normal attention span and concentration.  Behavior appropriate. No anxious or depressed appearing.      Assessment & Plan:  Assessment  Diabetes HTN Dyslipidemia,  high triglycerides Hypogonadism- was unable to afford HRT 2012, no sx  Osteoporosis: T score -2.4  (05-2012). T score  -2.3 (05-2016): Rx Ca, vitamin D Vitamin D deficiency DJD-- meloxicam prn Rosacea Increase LFTs -- First noted ~ 04-2015, no etoh/tylenol. changed simvastatin to Lipitor 04-2014 and Lipitor was d/c 2-17. We also d/c fenofibrates 09-2015 ; LFTs back to normal  01-2016  Fatty liver per US 07-18-2015 Hep B-C  Neg 07-2015;  ceruloplasmin, ferritin, iron and alpha-1 antitrypsin ---> (-) 09-2015  PLAN: Viral syndrome: Started with a viral syndrome less than 24 hours ago, he does not look toxic, there are sporadic flu cases of influenza in [place] per the  CDC map.  I think is safer to start him on Tamiflu plus symptomatic treatment.  Instructions carefully discussed in Spanish, emphasis on calling me back if he is not gradually improving.

## 2017-07-04 NOTE — Assessment & Plan Note (Signed)
Viral syndrome: Started with a viral syndrome less than 24 hours ago, he does not look toxic, there are sporadic flu cases of influenza in [place] per the  CDC map.  I think is safer to start him on Tamiflu plus symptomatic treatment.  Instructions carefully discussed in Spanish, emphasis on calling me back if he is not gradually improving.

## 2017-07-04 NOTE — Progress Notes (Signed)
Pre visit review using our clinic review tool, if applicable. No additional management support is needed unless otherwise documented below in the visit note. 

## 2017-07-04 NOTE — Patient Instructions (Signed)
Rest, fluids , tylenol  For cough:  Take Mucinex DM twice a day as needed until better  For nasal congestion: Use OTC   Flonase : 2 nasal sprays on each side of the nose in the morning until you feel better   Take TAMIFLU x 5 days   Call if not gradually better over the next  10 days  Call anytime if the symptoms are severe

## 2017-07-29 ENCOUNTER — Other Ambulatory Visit: Payer: Self-pay | Admitting: Internal Medicine

## 2017-07-29 DIAGNOSIS — E785 Hyperlipidemia, unspecified: Secondary | ICD-10-CM

## 2017-08-15 ENCOUNTER — Encounter: Payer: Self-pay | Admitting: Internal Medicine

## 2017-08-15 ENCOUNTER — Ambulatory Visit (INDEPENDENT_AMBULATORY_CARE_PROVIDER_SITE_OTHER): Payer: Medicare HMO | Admitting: Internal Medicine

## 2017-08-15 VITALS — BP 142/78 | HR 58 | Temp 97.7°F | Resp 14 | Ht 67.0 in | Wt 205.1 lb

## 2017-08-15 DIAGNOSIS — E118 Type 2 diabetes mellitus with unspecified complications: Secondary | ICD-10-CM

## 2017-08-15 DIAGNOSIS — Z0000 Encounter for general adult medical examination without abnormal findings: Secondary | ICD-10-CM | POA: Diagnosis not present

## 2017-08-15 DIAGNOSIS — E785 Hyperlipidemia, unspecified: Secondary | ICD-10-CM

## 2017-08-15 DIAGNOSIS — E559 Vitamin D deficiency, unspecified: Secondary | ICD-10-CM

## 2017-08-15 LAB — COMPREHENSIVE METABOLIC PANEL
ALT: 21 U/L (ref 0–53)
AST: 24 U/L (ref 0–37)
Albumin: 4 g/dL (ref 3.5–5.2)
Alkaline Phosphatase: 69 U/L (ref 39–117)
BUN: 17 mg/dL (ref 6–23)
CALCIUM: 9.3 mg/dL (ref 8.4–10.5)
CO2: 29 mEq/L (ref 19–32)
CREATININE: 1.05 mg/dL (ref 0.40–1.50)
Chloride: 101 mEq/L (ref 96–112)
GFR: 71.82 mL/min (ref >60.00)
Glucose, Bld: 127 mg/dL — ABNORMAL HIGH (ref 70–99)
Potassium: 3.7 mEq/L (ref 3.5–5.1)
Sodium: 138 mEq/L (ref 135–145)
Total Bilirubin: 1.1 mg/dL (ref 0.2–1.2)
Total Protein: 7.8 g/dL (ref 6.0–8.3)

## 2017-08-15 LAB — HEMOGLOBIN A1C: Hgb A1c MFr Bld: 7.3 % — ABNORMAL HIGH (ref 4.6–6.5)

## 2017-08-15 LAB — LIPID PANEL
Cholesterol: 135 mg/dL (ref 0–200)
HDL: 33.2 mg/dL — ABNORMAL LOW (ref >39.00)
NONHDL: 101.44
TRIGLYCERIDES: 313 mg/dL — AB (ref 0.0–149.0)
Total CHOL/HDL Ratio: 4
VLDL: 62.6 mg/dL — ABNORMAL HIGH (ref 0.0–40.0)

## 2017-08-15 LAB — LDL CHOLESTEROL, DIRECT: LDL DIRECT: 59 mg/dL

## 2017-08-15 LAB — TSH: TSH: 3.71 u[IU]/mL (ref 0.35–4.50)

## 2017-08-15 MED ORDER — SILDENAFIL CITRATE 20 MG PO TABS
60.0000 mg | ORAL_TABLET | Freq: Every evening | ORAL | 1 refills | Status: DC | PRN
Start: 2017-08-15 — End: 2017-08-17

## 2017-08-15 NOTE — Progress Notes (Signed)
Pre visit review using our clinic review tool, if applicable. No additional management support is needed unless otherwise documented below in the visit note. 

## 2017-08-15 NOTE — Progress Notes (Signed)
Subjective:    Patient ID: Maurice Colon, male    DOB: 09-13-1932, 84 y.o.   MRN: [id number]  DOS:  08/15/2017 Type of visit - description : cpx Interval history: Here for CPX, no major concerns.  We also talk about his chronic medical issues  Review of Systems + Nocturia, 3 or 4 times a night, symptoms are not bothersome to the patient.  Other than above, a 14 point review of systems is negative     Past Medical History:  Diagnosis Date  . DJD (degenerative joint disease)   . Hyperlipidemia    Hypertriglyceridemia  . Hypertension   . Hypogonadism, male   . Osteoporosis   . Prediabetes 07/16/2013  . Rosacea     Past Surgical History:  Procedure Laterality Date  . CATARACT EXTRACTION     Bilaterally  . INGUINAL HERNIA REPAIR      Social History   Socioeconomic History  . Marital status: Married    Spouse name: Not on file  . Number of children: 2  . Years of education: Not on file  . Highest education level: Not on file  Social Needs  . Financial resource strain: Not on file  . Food insecurity - worry: Not on file  . Food insecurity - inability: Not on file  . Transportation needs - medical: Not on file  . Transportation needs - non-medical: Not on file  Occupational History  . Occupation: retired    Associate Professor: RETIRED  Tobacco Use  . Smoking status: Never Smoker  . Smokeless tobacco: Never Used  Substance and Sexual Activity  . Alcohol use: No    Comment: wine, occasionally  . Drug use: Not on file  . Sexual activity: Not on file  Other Topics Concern  . Not on file  Social History Narrative   [place]   Lives w/ wife and 1 daughter (bipolar)   Moved to [place] early 2008        Family History  Problem Relation Age of Onset  . Diabetes Mother   . Hypertension Mother   . Heart attack Father        age 27  . Cancer Neg Hx      Allergies as of 08/15/2017      Reactions   Ace Inhibitors    REACTION: cough   Amlodipine Besylate    REACTION: edema      Medication List        Accurate as of 08/15/17 11:59 PM. Always use your most recent med list.          aspirin 81 MG tablet Take 81 mg by mouth daily.   atorvastatin 10 MG tablet Commonly known as:  LIPITOR Take 1 tablet (10 mg total) by mouth daily.   cholecalciferol 1000 units tablet Commonly known as:  VITAMIN D Take 1,000 Units by mouth daily. Reported on 09/16/2015   losartan-hydrochlorothiazide 100-25 MG tablet Commonly known as:  HYZAAR Take 1 tablet by mouth daily.   meloxicam 7.5 MG tablet Commonly known as:  MOBIC TAKE 1 TO 2 TABLETS EVERY DAY AS NEEDED FOR PAIN   potassium chloride 10 MEQ tablet Commonly known as:  K-DUR Take 1 tablet (10 mEq total) by mouth daily.   sildenafil 20 MG tablet Commonly known as:  REVATIO Take 3-4 tablets (60-80 mg total) by mouth at bedtime as needed.          Objective:   Physical Exam BP (!) 142/78 (BP  Location: Right Arm, Patient Position: Sitting, Cuff Size: Normal)   Pulse (!) 58   Temp 97.7 F (36.5 C) (Oral)   Resp 14   Ht 5\' 7"  (1.702 m)   Wt 205 lb 2 oz (93 kg)   SpO2 99%   BMI 32.13 kg/m  General:   Well developed, well nourished . NAD.  Neck: No  thyromegaly  HEENT:  Normocephalic . Face symmetric, atraumatic Lungs:  CTA B Normal respiratory effort, no intercostal retractions, no accessory muscle use. Heart: RRR,  no murmur.  No pretibial edema bilaterally  Abdomen:  Not distended, soft, non-tender. No rebound or rigidity.   Skin: Exposed areas without rash. Not pale. Not jaundice DIABETIC FEET EXAM: No lower extremity edema Normal pedal pulses bilaterally Skin normal, nails normal, no calluses Pinprick examination of the feet normal. Neurologic:  alert & oriented X3.  Speech normal, gait appropriate for age and unassisted Strength symmetric and appropriate for age.  Psych: Cognition and judgment appear intact.  Cooperative with normal  attention span and concentration.  Behavior appropriate. No anxious or depressed appearing.     Assessment & Plan:   Assessment  Diabetes HTN Dyslipidemia,  high triglycerides Hypogonadism- was unable to afford HRT 2012, no sx  Osteoporosis: T score -2.4  (05-2012). T score -2.3 (05-2016): Rx Ca, vitamin D Vitamin D deficiency DJD-- meloxicam prn Rosacea Increase LFTs -- First noted ~ 04-2015, no etoh/tylenol. changed simvastatin to Lipitor 04-2014 and Lipitor was d/c 2-17. We also d/c fenofibrates 09-2015 ; LFTs back to normal  01-2016  Fatty liver per US 07-18-2015 Hep B-C  Neg 07-2015;  ceruloplasmin, ferritin, iron and alpha-1 antitrypsin ---> (-) 09-2015  PLAN: DM: Currently diet controlled, has not been doing well with diet, patient is counseled.  Check a A1c.  Feet exam (-). Last A1c increased, consider medication.  HTN: Continue Hyzaar, potassium, checking labs. High cholesterol: On Lipitor, checking labs DJD: Takes meloxicam rarely. Nocturia: sx not bothersome to the patient, states  is better if he avoids late drinks.  Rx observation. Vit  D deficiency: On OTCs, checking labs ED: Has tried Viagra before w/o s/e, wonders if he can try again.  Sent a prescription for sildenafil, s/e and how to use it discussed. RTC 4 months

## 2017-08-15 NOTE — Patient Instructions (Signed)
GO TO THE LAB : Get the blood work     GO TO THE FRONT DESK Schedule your next appointment for a  Check up in 4 months   SELF LEARNING IS VERY IMPORTANT FOR YOUR HEALTH  Diabetes for Dummies "The [facility] Diabetes diet" book The American diabetes Association  Http://www.diabetes.org  Visit [facility] website it is a great resource http://www.[facility].org  [facility] web site for Diabetes StagedNews.nohttp://www.mayoclinic.org/diseases-conditions/diabetes/basics/definition/con-20033091

## 2017-08-15 NOTE — Assessment & Plan Note (Addendum)
-  Td: 2011; Pneumonia shot 05/2007; prevnar  2016; s/p zostavax, no documentation; shingrex discussed;  had a flu shot  -CCS: Colonoscopy 12-11,  (-), no further screening scopes - prostate ca screening: DRE PSA 09-2014 wnl, no further screening per guidelines, pt agrees  -Labs: CMP, FLP, A1c, TSH, vitamin D - Diet not good  lately, discuss a healthy diet with low carbohydrates.  See AVS -Exercise: he is trying to be active doing push-ups and sit ups, recommend also aerobic exercise.

## 2017-08-16 NOTE — Assessment & Plan Note (Signed)
DM: Currently diet controlled, has not been doing well with diet, patient is counseled.  Check a A1c.  Feet exam (-). Last A1c increased, consider medication.  HTN: Continue Hyzaar, potassium, checking labs. High cholesterol: On Lipitor, checking labs DJD: Takes meloxicam rarely. Nocturia: sx not bothersome to the patient, states  is better if he avoids late drinks.  Rx observation. Vit  D deficiency: On OTCs, checking labs ED: Has tried Viagra before w/o s/e, wonders if he can try again.  Sent a prescription for sildenafil, s/e and how to use it discussed. RTC 4 months

## 2017-08-17 MED ORDER — METFORMIN HCL 850 MG PO TABS: 850.0000 mg | ORAL_TABLET | Freq: Two times a day (BID) | ORAL | 4 refills | Status: DC

## 2017-08-17 MED ORDER — SILDENAFIL CITRATE 20 MG PO TABS: 60.0000 mg | ORAL_TABLET | Freq: Every evening | ORAL | 1 refills | Status: DC | PRN

## 2017-08-17 NOTE — Addendum Note (Signed)
Addended byConrad [place]: Maia Handa D on: 08/17/2017 04:18 PM   Modules accepted: Orders

## 2017-08-18 LAB — VITAMIN D 1,25 DIHYDROXY
Vitamin D 1, 25 (OH)2 Total: 15 pg/mL — ABNORMAL LOW (ref 18–72)
Vitamin D3 1, 25 (OH)2: 15 pg/mL

## 2017-08-25 MED ORDER — VITAMIN D (ERGOCALCIFEROL) 1.25 MG (50000 UNIT) PO CAPS: 50000.0000 [IU] | ORAL_CAPSULE | ORAL | 0 refills | Status: DC

## 2017-08-25 NOTE — Addendum Note (Signed)
Addended byConrad [place]: Maurice Colon D on: 08/25/2017 03:06 PM   Modules accepted: Orders

## 2017-08-29 ENCOUNTER — Ambulatory Visit: Payer: Self-pay

## 2017-08-29 NOTE — Telephone Encounter (Signed)
   Answer Assessment - Initial Assessment Questions 1. REASON FOR CALL or QUESTION: "What is your reason for calling today?" or "How can I best help you?" or "What question do you have that I can help answer?"     Pt states he took one dose of medication(Metformin) and "is not feeling well" and "it's like he has knots in his stomach"-  Pt is able to drink without difficulty and has an appt for tomorrow already in place with Dr Drue NovelPaz.  Protocols used: INFORMATION ONLY CALL-A-AH

## 2017-08-30 ENCOUNTER — Encounter: Payer: Self-pay | Admitting: Internal Medicine

## 2017-08-30 ENCOUNTER — Ambulatory Visit (INDEPENDENT_AMBULATORY_CARE_PROVIDER_SITE_OTHER): Payer: Medicare HMO | Admitting: Internal Medicine

## 2017-08-30 VITALS — BP 138/72 | HR 49 | Temp 97.4°F | Resp 14 | Ht 67.0 in | Wt 201.5 lb

## 2017-08-30 DIAGNOSIS — Z0100 Encounter for examination of eyes and vision without abnormal findings: Secondary | ICD-10-CM

## 2017-08-30 DIAGNOSIS — R110 Nausea: Secondary | ICD-10-CM

## 2017-08-30 DIAGNOSIS — E118 Type 2 diabetes mellitus with unspecified complications: Secondary | ICD-10-CM

## 2017-08-30 DIAGNOSIS — E119 Type 2 diabetes mellitus without complications: Secondary | ICD-10-CM

## 2017-08-30 MED ORDER — PIOGLITAZONE HCL 30 MG PO TABS: 30.0000 mg | ORAL_TABLET | Freq: Every day | ORAL | 6 refills | Status: DC

## 2017-08-30 MED ORDER — DICYCLOMINE HCL 10 MG PO CAPS: 10.0000 mg | ORAL_CAPSULE | Freq: Four times a day (QID) | ORAL | 0 refills | Status: DC | PRN

## 2017-08-30 NOTE — Patient Instructions (Addendum)
PARA EL AZUCAR PIOGLITAZONE: UNA PASTILLA AL DIA EN LA MAN~ANA  PARA EL COLICO: DICYCLOMINE CADA 6 HORAS SI LO NECESITA  LLAME SI NO ESTA MEJOR EN 1 SEMANA

## 2017-08-30 NOTE — Progress Notes (Signed)
Subjective:    Patient ID: Maurice Colon, male    DOB: 05/31/1933, 84 y.o.   MRN: [id number]  DOS:  08/30/2017 Type of visit - description : acute Interval history: Was prescribed Metformin 850 mg. Took  1 tablet in the morning and felt  well. The next day, by mistake, took metformin  850 mg 2 tablets together in the morning. He felt poorly: Dizziness, diarrhea, nausea, abdominal pain. All symptoms resolve within 24 hours except abdominal pain. Still has upper bilateral colicky pain in the abdomen, on and off, episodes last 2 hours, sometimes 2 to 3 times a day. Food and water intake make the pain better.   Review of Systems Denies fever chills. Appetite is very good. Bowel movements normal, stools normal color No heartburn, dysphagia or odynophagia  Past Medical History:  Diagnosis Date  . DJD (degenerative joint disease)   . Hyperlipidemia    Hypertriglyceridemia  . Hypertension   . Hypogonadism, male   . Osteoporosis   . Prediabetes 07/16/2013  . Rosacea     Past Surgical History:  Procedure Laterality Date  . CATARACT EXTRACTION     Bilaterally  . INGUINAL HERNIA REPAIR      Social History   Socioeconomic History  . Marital status: Married    Spouse name: Not on file  . Number of children: 2  . Years of education: Not on file  . Highest education level: Not on file  Social Needs  . Financial resource strain: Not on file  . Food insecurity - worry: Not on file  . Food insecurity - inability: Not on file  . Transportation needs - medical: Not on file  . Transportation needs - non-medical: Not on file  Occupational History  . Occupation: retired    Associate Professor: RETIRED  Tobacco Use  . Smoking status: Never Smoker  . Smokeless tobacco: Never Used  Substance and Sexual Activity  . Alcohol use: No    Comment: wine, occasionally  . Drug use: Not on file  . Sexual activity: Not on file  Other Topics Concern  . Not on file  Social History  Narrative   [place]   Lives w/ wife and 1 daughter (bipolar)   Moved to [place] early 2008         Allergies as of 08/30/2017      Reactions   Ace Inhibitors    REACTION: cough   Amlodipine Besylate    REACTION: edema   Metformin And Related Nausea Only   Intolerant, no allergic reaction, see OV 08-30-17      Medication List        Accurate as of 08/30/17 10:01 AM. Always use your most recent med list.          aspirin 81 MG tablet Take 81 mg by mouth daily.   atorvastatin 10 MG tablet Commonly known as:  LIPITOR Take 1 tablet (10 mg total) by mouth daily.   cholecalciferol 1000 units tablet Commonly known as:  VITAMIN D Take 1,000 Units by mouth daily. Reported on 09/16/2015   dicyclomine 10 MG capsule Commonly known as:  BENTYL Take 1 capsule (10 mg total) by mouth 4 (four) times daily as needed for spasms.   losartan-hydrochlorothiazide 100-25 MG tablet Commonly known as:  HYZAAR Take 1 tablet by mouth daily.   meloxicam 7.5 MG tablet Commonly known as:  MOBIC TAKE 1 TO 2 TABLETS EVERY DAY AS NEEDED FOR PAIN   pioglitazone 30 MG  tablet Commonly known as:  ACTOS Take 1 tablet (30 mg total) by mouth daily.   potassium chloride 10 MEQ tablet Commonly known as:  K-DUR Take 1 tablet (10 mEq total) by mouth daily.   sildenafil 20 MG tablet Commonly known as:  REVATIO Take 3-4 tablets (60-80 mg total) by mouth at bedtime as needed.   Vitamin D (Ergocalciferol) 50000 units Caps capsule Commonly known as:  DRISDOL Take 1 capsule (50,000 Units total) by mouth every 7 (seven) days.          Objective:   Physical Exam BP 138/72 (BP Location: Left Arm, Patient Position: Sitting, Cuff Size: Normal)   Pulse (!) 49   Temp (!) 97.4 F (36.3 C) (Oral)   Resp 14   Ht 5\' 7"  (1.702 m)   Wt 201 lb 8 oz (91.4 kg)   SpO2 96%   BMI 31.56 kg/m  General:   Well developed, well nourished . NAD.  HEENT:  Normocephalic . Face symmetric, atraumatic.   Not pale.  Abdomen:  Not distended, soft, non-tender. No rebound or rigidity.   Skin: Not pale. Not jaundice Neurologic:  alert & oriented X3.  Speech normal, gait appropriate for age and unassisted Psych--  Cognition and judgment appear intact.  Cooperative with normal attention span and concentration.  Behavior appropriate. No anxious or depressed appearing.     Assessment & Plan:   Assessment  Diabetes: tried metformin, see note 08-30-17, has s/e, ok to try again if needed (low dose) HTN Dyslipidemia,  high triglycerides Hypogonadism- was unable to afford HRT 2012, no sx  Osteoporosis: T score -2.4  (05-2012). T score -2.3 (05-2016): Rx Ca, vitamin D Vitamin D deficiency DJD-- meloxicam prn Rosacea Increase LFTs -- First noted ~ 04-2015, no etoh/tylenol. changed simvastatin to Lipitor 04-2014 and Lipitor was d/c 2-17. We also d/c fenofibrates 09-2015 ; LFTs back to normal  01-2016  Fatty liver per US 07-18-2015 Hep B-C  Neg 07-2015;  ceruloplasmin, ferritin, iron and alpha-1 antitrypsin ---> (-) 09-2015  PLAN: DM: Last A1c was 7.3, he was prescribed metformin, developed s/e after taking it, but likely because he did not titrate the medication appropriately.  Nevertheless is 10 days since the last metformin dose and he still has colicky abdominal pain.  We will put metformin in his intolerance list however in the future I would not be opposed to retry at a low dose. Start Actos 30 mg 1 p.o. Daily H/o retinopathy: Refer to ophthalmology Abdominal pain: As described above, probably related to metformin, trial with Bentyl.  Call if not back to normal in 1 week.  (abd US 2016 with no GB stones, repeat US?) RTC by May as schedule

## 2017-08-30 NOTE — Progress Notes (Signed)
Pre visit review using our clinic review tool, if applicable. No additional management support is needed unless otherwise documented below in the visit note. 

## 2017-08-31 NOTE — Assessment & Plan Note (Signed)
DM: Last A1c was 7.3, he was prescribed metformin, developed s/e after taking it, but likely because he did not titrate the medication appropriately.  Nevertheless is 10 days since the last metformin dose and he still has colicky abdominal pain.  We will put metformin in his intolerance list however in the future I would not be opposed to retry at a low dose. Start Actos 30 mg 1 p.o. Daily H/o retinopathy: Refer to ophthalmology Abdominal pain: As described above, probably related to metformin, trial with Bentyl.  Call if not back to normal in 1 week.  (abd US 2016 with no GB stones, repeat US?) RTC by May as schedule

## 2017-09-07 ENCOUNTER — Telehealth: Payer: Self-pay

## 2017-09-07 NOTE — Telephone Encounter (Signed)
Copied from CRM [id number]. Topic: [facility] >> Sep 07, 2017 11:33 AM Elliot GaultBell, Tiffany M wrote: Reason for CRM:   Caller name: Melissa  Relation to pt: [facility]  Call back number: [phone]   Reason for call:  [facility] wanted to inform PCP annual eye exam referral was received and wanted to ensure if there was anything else PCP wanted specialist to examine. Patient scheduled for routine eye exam 07/10/18 and last annual exam 06/28/17, please advise  >> Sep 07, 2017 11:42 AM Elliot GaultBell, Tiffany M wrote: Reason for CRM:   Caller name: Melissa  Relation to pt: [facility]  Call back number: [phone]   Reason for call:  [facility] wanted to inform PCP annual eye exam referral was received and wanted to ensure if there was anything else PCP wanted specialist to examine. Patient scheduled for routine eye exam 07/10/18 and last annual exam 06/28/17, please

## 2017-09-07 NOTE — Telephone Encounter (Signed)
Noted in chart.

## 2017-10-04 ENCOUNTER — Encounter: Payer: Self-pay | Admitting: Internal Medicine

## 2017-10-04 ENCOUNTER — Ambulatory Visit (INDEPENDENT_AMBULATORY_CARE_PROVIDER_SITE_OTHER): Payer: Medicare HMO | Admitting: Internal Medicine

## 2017-10-04 VITALS — BP 138/84 | HR 81 | Temp 98.7°F | Resp 16 | Ht 67.0 in | Wt 201.4 lb

## 2017-10-04 DIAGNOSIS — J029 Acute pharyngitis, unspecified: Secondary | ICD-10-CM

## 2017-10-04 DIAGNOSIS — B349 Viral infection, unspecified: Secondary | ICD-10-CM | POA: Diagnosis not present

## 2017-10-04 LAB — POCT RAPID STREP A (OFFICE): Rapid Strep A Screen: NEGATIVE

## 2017-10-04 MED ORDER — OSELTAMIVIR PHOSPHATE 75 MG PO CAPS: 75.0000 mg | ORAL_CAPSULE | Freq: Two times a day (BID) | ORAL | 0 refills | Status: DC

## 2017-10-04 NOTE — Patient Instructions (Signed)
Rest, fluids , tylenol  For cough:  Take Mucinex DM twice a day as needed until better  For nasal congestion: Use OTC Nasocort or Flonase : 2 nasal sprays on each side of the nose in the morning until you feel better   Take  TAMIFLU for 5 days   Call if not gradually better over the next  10 days  Call anytime if the symptoms are severe   Gripe en los adultos (Influenza, Adult) La gripe es una infeccin viral que afecta principalmente las vas respiratorias. Las vas respiratorias incluyen rganos que ayudan a Industrial/product designerrespirar, Toll Brotherscomo los pulmones, la nariz y Administratorla garganta. La gripe provoca muchos sntomas del resfro comn, as como fiebre alta y Tourist information centre managerdolor corporal. Se transmite fcilmente de persona a persona (es contagiosa). La mejor manera de prevenir la gripe es aplicndose la vacuna contra la gripe todos los aos. CAUSAS La gripe es causada por un virus. Se puede contagiar el virus de las siguientes Los Heroes Comunidadmaneras:  Al aspirar las gotitas que una persona infectada elimina al toser o Engineering geologistestornudar.  Al tocar algo que fue recientemente contaminado con el virus y Tenet Healthcareluego llevarse la mano a la boca, la nariz o los ojos. FACTORES DE RIESGO Los siguientes factores pueden hacer que usted sea propenso a Primary school teachercontraer la gripe:  No lavarse las manos frecuentemente con agua y Belarusjabn o un desinfectante de manos a base de alcohol.  Tener contacto cercano con [place] personas durante la temporada de resfro y gripe.  Tocarse la boca, los ojos o la nariz sin lavarse ni desinfectarse las manos primero.  No beber suficientes lquidos o no seguir una dieta saludable.  No dormir lo suficiente o no hacer suficiente actividad fsica.  Tener un alto grado de estrs.  No colocarse la vacuna anual contra la gripe. Puede correr un mayor riesgo de complicaciones de la gripe, como una infeccin pulmonar grave (neumona) si:  Tiene ms de 67aos.  Est embarazada.  Tiene un sistema que combate las defensas (sistema  inmunitario) debilitado. Puede tener un sistema inmunitario debilitado si: ? Tiene VIH o sida. ? Est recibiendo quimioterapia. ? [place]asa medicamentos que reducen Coventry Health Carela actividad (suprimen) del sistema inmunitario.  Tiene una enfermedad a largo plazo (crnica), como cardiopata coronaria, enfermedad renal, diabetes o enfermedad pulmonar.  Tiene un trastorno heptico.  Son obesas.  Tiene anemia. SNTOMAS Los sntomas de esta afeccin por lo general duran entre 4 y 6110das, y Conservator, museum/gallerypueden tener los siguientes sntomas:  Grant RutsFiebre.  Escalofros.  Dolor de Turkmenistancabeza, dolores en el cuerpo o dolores musculares.  Dolor de Advertising copywritergarganta.  Tos.  Secrecin o congestin nasal.  Molestias en el pecho y tos.  Prdida del apetito.  Debilidad o cansancio (fatiga).  Mareos.  Nuseas o vmitos. DIAGNSTICO Esta afeccin se puede diagnosticar en funcin de los antecedentes mdicos y un examen fsico. El mdico puede indicarle un cultivo farngeo o nasal para confirmar el diagnstico. TRATAMIENTO Si la gripe se detecta de manera temprana, puede recibir tratamiento con medicamentos antivirales que pueden reducir la duracin de la enfermedad y la gravedad de los sntomas. Este medicamento se puede administrar por va oral o por va intravenosa (IV), es decir, a travs de un tubo que se coloca en una vena. El objetivo del tratamiento es aliviar los sntomas cuidndose en su casa. Esto puede incluir usar medicamentos de venta libre, beber una gran cantidad de lquidos y agregar humedad al aire en su casa. En algunos casos, la gripe se cura sin tratamiento. Los Illinois Tool Workscasos graves o  las complicaciones de gripe se pueden tratar en un hospital. INSTRUCCIONES PARA EL CUIDADO EN EL HOGAR  Tome los medicamentos de venta libre y los recetados solamente como se lo haya indicado el mdico.  Use un humidificador de aire fro para agregar humedad al aire de su casa. Esto puede ayudarlo a Solicitor.  Descanse todo lo que sea  necesario.  Beba suficiente lquido para Photographer orina clara o de color amarillo plido.  Al toser o estornudar, cbrase la boca y la Sneads.  Lvese las manos con agua y jabn frecuentemente, en especial despus de toser o Engineering geologist. Use desinfectante para manos si no dispone de [place] y [place].  Lanny Hurst en su casa y no concurra al Aleen Campi o a la escuela, como se lo haya indicado el mdico. A menos que visite al [employer], evite salir de su casa hasta que no tenga fiebre durante 24horas sin el uso de medicamentos.  Concurra a todas las visitas de control como se lo haya indicado el mdico. Esto es importante.  PREVENCIN  Aplicarse la vacuna anual contra la gripe es la mejor manera de evitar contagiarse la gripe. Puede aplicarse la vacuna contra la gripe a fines de verano, en otoo o en invierno. Pregntele al mdico cundo debe aplicarse la vacuna contra la gripe.  Lvese las manos o use un desinfectante de manos con frecuencia.  Evite el contacto con personas que estn enfermas durante la temporada de resfro y gripe.  Siga una dieta saludable, beba muchos lquidos, duerma lo suficiente y realice actividad fsica con regularidad.  SOLICITE ATENCIN MDICA SI:  Presenta nuevos sntomas.  Tiene los siguientes sntomas: ? Journalist, newspaper. ? Diarrea. ? Fiebre.  La tos empeora.  Produce ms mucosidad.  Siente nuseas o vomita.  SOLICITE ATENCIN MDICA DE INMEDIATO SI:  Le falta el aire o tiene dificultad para respirar.  La piel o las uas se tornan de un color [place].  Presenta dolor intenso o rigidez de cuello.  Le duele la cabeza de forma repentina o tiene dolor en la cara o el odo.  No puede dejar de vomitar.  Esta informacin no tiene Theme park manager el consejo del mdico. Asegrese de hacerle al mdico cualquier pregunta que tenga. Document Released: 04/28/2005 Document Revised: 11/10/2015 Document Reviewed: 05/13/2015 Elsevier Interactive Patient  Education  2017 [employer].

## 2017-10-04 NOTE — Assessment & Plan Note (Signed)
Viral syndrome: sx c/w viral syndrome, possibly a mild case of influenza given the fact that he had his flu shot. We check a rapid strep test: Negative. Plan: Tamiflu, supportive care, see AVS, call if not better

## 2017-10-04 NOTE — Progress Notes (Signed)
Pre visit review using our clinic review tool, if applicable. No additional management support is needed unless otherwise documented below in the visit note. 

## 2017-10-04 NOTE — Progress Notes (Signed)
Subjective:    Patient ID: Maurice Colon, male    DOB: May 15, 1933, 84 y.o.   MRN: [id number]  DOS:  10/04/2017 Type of visit - description : acute Interval history: Developed mild sore throat 3 days ago Yesterday he actually got much worse: Subjective fever, hoarseness, cough. Today he is not running fevers. Many family members with similar symptoms  Review of Systems Denies sinus pain, congestion No nausea, vomiting, diarrhea Having mild myalgias since yesterday + Sputum production since last night, yellow in color No headaches  Past Medical History:  Diagnosis Date  . DJD (degenerative joint disease)   . Hyperlipidemia    Hypertriglyceridemia  . Hypertension   . Hypogonadism, male   . Osteoporosis   . Prediabetes 07/16/2013  . Rosacea     Past Surgical History:  Procedure Laterality Date  . CATARACT EXTRACTION     Bilaterally  . INGUINAL HERNIA REPAIR      Social History   Socioeconomic History  . Marital status: Married    Spouse name: Not on file  . Number of children: 2  . Years of education: Not on file  . Highest education level: Not on file  Social Needs  . Financial resource strain: Not on file  . Food insecurity - worry: Not on file  . Food insecurity - inability: Not on file  . Transportation needs - medical: Not on file  . Transportation needs - non-medical: Not on file  Occupational History  . Occupation: retired    Associate Professor: RETIRED  Tobacco Use  . Smoking status: Never Smoker  . Smokeless tobacco: Never Used  Substance and Sexual Activity  . Alcohol use: No    Comment: wine, occasionally  . Drug use: Not on file  . Sexual activity: Not on file  Other Topics Concern  . Not on file  Social History Narrative   [place] from [place]   Lives w/ wife and 1 daughter (bipolar)   Moved to [place] early 2008         Allergies as of 10/04/2017      Reactions   Ace Inhibitors    REACTION: cough   Amlodipine Besylate    REACTION: edema   Metformin And Related Nausea Only   Intolerant, no allergic reaction, see OV 08-30-17      Medication List        Accurate as of 10/04/17  6:25 PM. Always use your most recent med list.          aspirin 81 MG tablet Take 81 mg by mouth daily.   atorvastatin 10 MG tablet Commonly known as:  LIPITOR Take 1 tablet (10 mg total) by mouth daily.   cholecalciferol 1000 units tablet Commonly known as:  VITAMIN D Take 1,000 Units by mouth daily. Reported on 09/16/2015   dicyclomine 10 MG capsule Commonly known as:  BENTYL Take 1 capsule (10 mg total) by mouth 4 (four) times daily as needed for spasms.   losartan-hydrochlorothiazide 100-25 MG tablet Commonly known as:  HYZAAR Take 1 tablet by mouth daily.   meloxicam 7.5 MG tablet Commonly known as:  MOBIC TAKE 1 TO 2 TABLETS EVERY DAY AS NEEDED FOR PAIN   oseltamivir 75 MG capsule Commonly known as:  TAMIFLU Take 1 capsule (75 mg total) by mouth 2 (two) times daily.   pioglitazone 30 MG tablet Commonly known as:  ACTOS Take 1 tablet (30 mg total) by mouth daily.   potassium chloride 10 MEQ tablet Commonly  known as:  K-DUR Take 1 tablet (10 mEq total) by mouth daily.   sildenafil 20 MG tablet Commonly known as:  REVATIO Take 3-4 tablets (60-80 mg total) by mouth at bedtime as needed.   Vitamin D (Ergocalciferol) 50000 units Caps capsule Commonly known as:  DRISDOL Take 1 capsule (50,000 Units total) by mouth every 7 (seven) days.          Objective:   Physical Exam BP 138/84 (BP Location: Left Arm, Patient Position: Sitting, Cuff Size: Normal)   Pulse 81   Temp 98.7 F (37.1 C) (Oral)   Resp 16   Ht 5\' 7"  (1.702 m)   Wt 201 lb 6 oz (91.3 kg)   SpO2 96%   BMI 31.54 kg/m  General:   Well developed, well nourished . NAD.  HEENT:  Normocephalic . Face symmetric, atraumatic. Nose slightly congested, sinuses no TTP.  Right TM normal, left TM obscured by wax. Throat symmetric, minimal  redness, no white discharge Lungs:  CTA B Normal respiratory effort, no intercostal retractions, no accessory muscle use. Heart: RRR,  no murmur.  No pretibial edema bilaterally  Skin: Not pale. Not jaundice Neurologic:  alert & oriented X3.  Speech normal, gait appropriate for age and unassisted Psych--  Cognition and judgment appear intact.  Cooperative with normal attention span and concentration.  Behavior appropriate. No anxious or depressed appearing.      Assessment & Plan:  Assessment  Diabetes: tried metformin, see note 08-30-17, has s/e, ok to try again if needed (low dose) HTN Dyslipidemia,  high triglycerides Hypogonadism- was unable to afford HRT 2012, no sx  Osteoporosis: T score -2.4  (05-2012). T score -2.3 (05-2016): Rx Ca, vitamin D Vitamin D deficiency DJD-- meloxicam prn Rosacea Increase LFTs -- First noted ~ 04-2015, no etoh/tylenol. changed simvastatin to Lipitor 04-2014 and Lipitor was d/c 2-17. We also d/c fenofibrates 09-2015 ; LFTs back to normal  01-2016  Fatty liver per [place] 07-18-2015 Hep B-C  Neg 07-2015;  ceruloplasmin, ferritin, iron and alpha-1 antitrypsin ---> (-) 09-2015  PLAN: Viral syndrome: sx c/w viral syndrome, possibly a mild case of influenza given the fact that he had his flu shot. We check a rapid strep test: Negative. Plan: Tamiflu, supportive care, see AVS, call if not better

## 2017-10-07 ENCOUNTER — Ambulatory Visit (INDEPENDENT_AMBULATORY_CARE_PROVIDER_SITE_OTHER): Payer: Medicare HMO | Admitting: Internal Medicine

## 2017-10-07 ENCOUNTER — Encounter: Payer: Self-pay | Admitting: Internal Medicine

## 2017-10-07 VITALS — BP 136/80 | HR 69 | Temp 98.6°F | Resp 16 | Ht 67.0 in | Wt 201.4 lb

## 2017-10-07 DIAGNOSIS — B349 Viral infection, unspecified: Secondary | ICD-10-CM

## 2017-10-07 MED ORDER — HYDROCODONE-HOMATROPINE 5-1.5 MG/5ML PO SYRP: 5.0000 mL | ORAL_SOLUTION | Freq: Three times a day (TID) | ORAL | 0 refills | Status: DC | PRN

## 2017-10-07 NOTE — Patient Instructions (Signed)
ademas de las Delphiotras medicinas, tome hydrocodone cada 8 horas para la tos intensa  puede causar suen~o  llame si no mejora en unos dias

## 2017-10-07 NOTE — Progress Notes (Signed)
Subjective:    Patient ID: Maurice Colon, male    DOB: 02-27-1933, 84 y.o.   MRN: [id number]  DOS:  10/07/2017 Type of visit - description : acute Interval history: Was diagnosed with a viral syndrome 3 days ago, is here because the cough is severe.  Has been unable to sleep the last 2 nights. Good compliance with medications as prescribed except that he is not using Flonase   Review of Systems  No fever chills Aches and pains decreased except the anterior chest, abdomen and back, associated with cough. Some white and yellow sputum production.  No hemoptysis No shortness of breath No nausea or vomiting.  No diarrhea  Past Medical History:  Diagnosis Date  . DJD (degenerative joint disease)   . Hyperlipidemia    Hypertriglyceridemia  . Hypertension   . Hypogonadism, male   . Osteoporosis   . Prediabetes 07/16/2013  . Rosacea     Past Surgical History:  Procedure Laterality Date  . CATARACT EXTRACTION     Bilaterally  . INGUINAL HERNIA REPAIR      Social History   Socioeconomic History  . Marital status: Married    Spouse name: Not on file  . Number of children: 2  . Years of education: Not on file  . Highest education level: Not on file  Social Needs  . Financial resource strain: Not on file  . Food insecurity - worry: Not on file  . Food insecurity - inability: Not on file  . Transportation needs - medical: Not on file  . Transportation needs - non-medical: Not on file  Occupational History  . Occupation: retired    Associate Professor: RETIRED  Tobacco Use  . Smoking status: Never Smoker  . Smokeless tobacco: Never Used  Substance and Sexual Activity  . Alcohol use: No    Comment: wine, occasionally  . Drug use: Not on file  . Sexual activity: Not on file  Other Topics Concern  . Not on file  Social History Narrative   [place]   Lives w/ wife and 1 daughter (bipolar)   Moved to [place] early 2008         Allergies as of 10/07/2017       Reactions   Ace Inhibitors    REACTION: cough   Amlodipine Besylate    REACTION: edema   Metformin And Related Nausea Only   Intolerant, no allergic reaction, see OV 08-30-17      Medication List        Accurate as of 10/07/17  5:05 PM. Always use your most recent med list.          aspirin 81 MG tablet Take 81 mg by mouth daily.   atorvastatin 10 MG tablet Commonly known as:  LIPITOR Take 1 tablet (10 mg total) by mouth daily.   cholecalciferol 1000 units tablet Commonly known as:  VITAMIN D Take 1,000 Units by mouth daily. Reported on 09/16/2015   dicyclomine 10 MG capsule Commonly known as:  BENTYL Take 1 capsule (10 mg total) by mouth 4 (four) times daily as needed for spasms.   HYDROcodone-homatropine 5-1.5 MG/5ML syrup Commonly known as:  HYCODAN Take 5 mLs by mouth 3 (three) times daily as needed for cough.   losartan-hydrochlorothiazide 100-25 MG tablet Commonly known as:  HYZAAR Take 1 tablet by mouth daily.   meloxicam 7.5 MG tablet Commonly known as:  MOBIC TAKE 1 TO 2 TABLETS EVERY DAY AS NEEDED FOR PAIN  oseltamivir 75 MG capsule Commonly known as:  TAMIFLU Take 1 capsule (75 mg total) by mouth 2 (two) times daily.   pioglitazone 30 MG tablet Commonly known as:  ACTOS Take 1 tablet (30 mg total) by mouth daily.   potassium chloride 10 MEQ tablet Commonly known as:  K-DUR Take 1 tablet (10 mEq total) by mouth daily.   sildenafil 20 MG tablet Commonly known as:  REVATIO Take 3-4 tablets (60-80 mg total) by mouth at bedtime as needed.   Vitamin D (Ergocalciferol) 50000 units Caps capsule Commonly known as:  DRISDOL Take 1 capsule (50,000 Units total) by mouth every 7 (seven) days.          Objective:   Physical Exam BP 136/80 (BP Location: Left Arm, Patient Position: Sitting, Cuff Size: Small)   Pulse 69   Temp 98.6 F (37 C) (Oral)   Resp 16   Ht 5\' 7"  (1.702 m)   Wt 201 lb 6 oz (91.3 kg)   SpO2 93%   BMI 31.54 kg/m    General:   Well developed, well nourished.  No distress, some cough noted HEENT:  Normocephalic . Face symmetric, atraumatic.  Nose is slightly congested, throat symmetric, no red Lungs:  Rhonchi, no crackles, no wheezing. Normal respiratory effort, no intercostal retractions, no accessory muscle use. Heart: RRR,  no murmur.  No pretibial edema bilaterally  Skin: Not pale. Not jaundice Neurologic:  alert & oriented X3.  Speech normal, gait appropriate for age and unassisted Psych--  Cognition and judgment appear intact.  Cooperative with normal attention span and concentration.  Behavior appropriate. No anxious or depressed appearing.      Assessment & Plan:   Assessment  Diabetes: tried metformin, see note 08-30-17, has s/e, ok to try again if needed (low dose) HTN Dyslipidemia,  high triglycerides Hypogonadism- was unable to afford HRT 2012, no sx  Osteoporosis: T score -2.4  (05-2012). T score -2.3 (05-2016): Rx Ca, vitamin D Vitamin D deficiency DJD-- meloxicam prn Rosacea Increase LFTs -- First noted ~ 04-2015, no etoh/tylenol. changed simvastatin to Lipitor 04-2014 and Lipitor was d/c 2-17. We also d/c fenofibrates 09-2015 ; LFTs back to normal  01-2016  Fatty liver per [place] 07-18-2015 Hep B-C  Neg 07-2015;  ceruloplasmin, ferritin, iron and alpha-1 antitrypsin ---> (-) 09-2015  PLAN: Viral syndrome: Dx 2 days ago, on Tamiflu, I do not think he is getting worse, we do need to treat his cough. In addition to what he is taking, recommend hydrocodone for a few days.  Drowsiness discussed.  If not better he will call, likely will need a x-ray and further eval.

## 2017-10-07 NOTE — Assessment & Plan Note (Signed)
Viral syndrome: Dx 2 days ago, on Tamiflu, I do not think he is getting worse, we do need to treat his cough. In addition to what he is taking, recommend hydrocodone for a few days.  Drowsiness discussed.  If not better he will call, likely will need a x-ray and further eval.

## 2017-10-07 NOTE — Progress Notes (Signed)
Pre visit review using our clinic review tool, if applicable. No additional management support is needed unless otherwise documented below in the visit note. 

## 2017-10-22 ENCOUNTER — Other Ambulatory Visit: Payer: Self-pay | Admitting: Internal Medicine

## 2017-10-22 DIAGNOSIS — E785 Hyperlipidemia, unspecified: Secondary | ICD-10-CM

## 2017-11-05 DIAGNOSIS — R05 Cough: Secondary | ICD-10-CM | POA: Diagnosis not present

## 2017-11-05 DIAGNOSIS — Z7982 Long term (current) use of aspirin: Secondary | ICD-10-CM | POA: Diagnosis not present

## 2017-11-05 DIAGNOSIS — J209 Acute bronchitis, unspecified: Secondary | ICD-10-CM | POA: Diagnosis not present

## 2017-11-05 DIAGNOSIS — I10 Essential (primary) hypertension: Secondary | ICD-10-CM | POA: Diagnosis not present

## 2017-11-05 DIAGNOSIS — Z79899 Other long term (current) drug therapy: Secondary | ICD-10-CM | POA: Diagnosis not present

## 2017-11-05 DIAGNOSIS — J208 Acute bronchitis due to other specified organisms: Secondary | ICD-10-CM | POA: Diagnosis not present

## 2017-12-12 ENCOUNTER — Other Ambulatory Visit: Payer: Self-pay

## 2017-12-13 ENCOUNTER — Encounter: Payer: Self-pay | Admitting: Internal Medicine

## 2017-12-13 ENCOUNTER — Ambulatory Visit (INDEPENDENT_AMBULATORY_CARE_PROVIDER_SITE_OTHER): Payer: Medicare HMO | Admitting: Internal Medicine

## 2017-12-13 VITALS — BP 126/60 | HR 51 | Temp 97.6°F | Resp 14 | Ht 67.0 in | Wt 199.0 lb

## 2017-12-13 DIAGNOSIS — E118 Type 2 diabetes mellitus with unspecified complications: Secondary | ICD-10-CM

## 2017-12-13 DIAGNOSIS — E785 Hyperlipidemia, unspecified: Secondary | ICD-10-CM | POA: Diagnosis not present

## 2017-12-13 LAB — BASIC METABOLIC PANEL
BUN: 18 mg/dL (ref 6–23)
CHLORIDE: 104 meq/L (ref 96–112)
CO2: 27 mEq/L (ref 19–32)
CREATININE: 0.94 mg/dL (ref 0.40–1.50)
Calcium: 9.4 mg/dL (ref 8.4–10.5)
GFR: 81.54 mL/min (ref >60.00)
GLUCOSE: 87 mg/dL (ref 70–99)
POTASSIUM: 3.9 meq/L (ref 3.5–5.1)
Sodium: 139 mEq/L (ref 135–145)

## 2017-12-13 LAB — LIPID PANEL
CHOL/HDL RATIO: 4
Cholesterol: 153 mg/dL (ref 0–200)
HDL: 41.3 mg/dL (ref >39.00)
LDL CALC: 75 mg/dL (ref 0–99)
NONHDL: 111.68
Triglycerides: 181 mg/dL — ABNORMAL HIGH (ref 0.0–149.0)
VLDL: 36.2 mg/dL (ref 0.0–40.0)

## 2017-12-13 LAB — HEMOGLOBIN A1C: Hgb A1c MFr Bld: 5.7 % (ref 4.6–6.5)

## 2017-12-13 LAB — ALT: ALT: 18 U/L (ref 0–53)

## 2017-12-13 LAB — AST: AST: 28 U/L (ref 0–37)

## 2017-12-13 LAB — TSH: TSH: 3.38 u[IU]/mL (ref 0.35–4.50)

## 2017-12-13 NOTE — Progress Notes (Signed)
Subjective:    Patient ID: Maurice Colon, male    DOB: Dec 01, 1932, 84 y.o.   MRN: [id number]  DOS:  12/13/2017 Type of visit - description : Routine visit Interval history: Here for a checkup, reports good medication compliance. Ambulatory BPs checking infrequently but okay. Taking meloxicam only as needed. Good medication compliance.  Wt Readings from Last 3 Encounters:  12/13/17 199 lb (90.3 kg)  10/07/17 201 lb 6 oz (91.3 kg)  10/04/17 201 lb 6 oz (91.3 kg)    Review of Systems Denies chest pain no difficulty breathing.  No lower extremity edema  Past Medical History:  Diagnosis Date  . DJD (degenerative joint disease)   . Hyperlipidemia    Hypertriglyceridemia  . Hypertension   . Hypogonadism, male   . Osteoporosis   . Prediabetes 07/16/2013  . Rosacea     Past Surgical History:  Procedure Laterality Date  . CATARACT EXTRACTION     Bilaterally  . INGUINAL HERNIA REPAIR      Social History   Socioeconomic History  . Marital status: Married    Spouse name: Not on file  . Number of children: 2  . Years of education: Not on file  . Highest education level: Not on file  Occupational History  . Occupation: retired    Associate Professor: RETIRED  Social Needs  . Financial resource strain: Not on file  . Food insecurity:    Worry: Not on file    Inability: Not on file  . Transportation needs:    Medical: Not on file    Non-medical: Not on file  Tobacco Use  . Smoking status: Never Smoker  . Smokeless tobacco: Never Used  Substance and Sexual Activity  . Alcohol use: No    Comment: wine, occasionally  . Drug use: Not on file  . Sexual activity: Not on file  Lifestyle  . Physical activity:    Days per week: Not on file    Minutes per session: Not on file  . Stress: Not on file  Relationships  . Social connections:    Talks on phone: Not on file    Gets together: Not on file    Attends religious service: Not on file    Active member of club or  organization: Not on file    Attends meetings of clubs or organizations: Not on file    Relationship status: Not on file  . Intimate partner violence:    Fear of current or ex partner: Not on file    Emotionally abused: Not on file    Physically abused: Not on file    Forced sexual activity: Not on file  Other Topics Concern  . Not on file  Social History Narrative   [place]   Lives w/ wife and 1 daughter (bipolar)   Moved to [place] early 2008         Allergies as of 12/13/2017      Reactions   Ace Inhibitors    REACTION: cough   Amlodipine Besylate    REACTION: edema   Metformin And Related Nausea Only   Intolerant, no allergic reaction, see OV 08-30-17      Medication List        Accurate as of 12/13/17 11:59 PM. Always use your most recent med list.          aspirin 81 MG tablet Take 81 mg by mouth daily.   atorvastatin 10 MG tablet Commonly known as:  LIPITOR Take 1 tablet (10 mg total) by mouth daily.   cholecalciferol 1000 units tablet Commonly known as:  VITAMIN D Take 1,000 Units by mouth daily. Reported on 09/16/2015   losartan-hydrochlorothiazide 100-25 MG tablet Commonly known as:  HYZAAR Take 1 tablet by mouth daily.   meloxicam 7.5 MG tablet Commonly known as:  MOBIC TAKE 1 TO 2 TABLETS EVERY DAY AS NEEDED FOR PAIN   pioglitazone 30 MG tablet Commonly known as:  ACTOS Take 1 tablet (30 mg total) by mouth daily.   potassium chloride 10 MEQ tablet Commonly known as:  K-DUR,KLOR-CON Take 1 tablet (10 mEq total) by mouth daily.   sildenafil 20 MG tablet Commonly known as:  REVATIO Take 3-4 tablets (60-80 mg total) by mouth at bedtime as needed.   Vitamin D (Ergocalciferol) 50000 units Caps capsule Commonly known as:  DRISDOL Take 1 capsule (50,000 Units total) by mouth every 7 (seven) days.          Objective:   Physical Exam BP 126/60 (BP Location: Left Arm, Patient Position: Sitting, Cuff Size: Small)   Pulse (!) 51    Temp 97.6 F (36.4 C) (Oral)   Resp 14   Ht  (1.702 m)   Wt 199 lb (90.3 kg)   SpO2 97%   BMI 31.17 kg/m  General:   Well developed, well nourished . NAD.  HEENT:  Normocephalic . Face symmetric, atraumatic Lungs:  CTA B Normal respiratory effort, no intercostal retractions, no accessory muscle use. Heart: RRR,  no murmur.  No pretibial edema bilaterally  Skin: Not pale. Not jaundice Neurologic:  alert & oriented X3.  Speech normal, gait appropriate for age and unassisted Psych--  Cognition and judgment appear intact.  Cooperative with normal attention span and concentration.  Behavior appropriate. No anxious or depressed appearing.      Assessment & Plan:    Assessment  Diabetes: tried metformin, see note 08-30-17, has s/e, ok to try again if needed (low dose) HTN Dyslipidemia,  high triglycerides Hypogonadism- was unable to afford HRT 2012, no sx  Osteoporosis: T score -2.4  (05-2012). T score -2.3 (05-2016): Rx Ca, vitamin D Vitamin D deficiency DJD-- meloxicam prn Rosacea Increase LFTs -- First noted ~ 04-2015, no etoh/tylenol. changed simvastatin to Lipitor 04-2014 and Lipitor was d/c 2-17. We also d/c fenofibrates 09-2015 ; LFTs back to normal  01-2016  Fatty liver per [place] 07-18-2015 Hep B-C  Neg 07-2015;  ceruloplasmin, ferritin, iron and alpha-1 antitrypsin ---> (-) 09-2015  PLAN: DM: Currently on Actos, check a BMP, A1c and a TSH.  Last TSH was a slightly elevated. Dyslipidemia: On Lipitor, likes his FLP check to see about his triglycerides.  Will do RTC 5 months.

## 2017-12-13 NOTE — Progress Notes (Signed)
Pre visit review using our clinic review tool, if applicable. No additional management support is needed unless otherwise documented below in the visit note. 

## 2017-12-13 NOTE — Patient Instructions (Signed)
GO TO THE LAB : Get the blood work     GO TO THE FRONT DESK Schedule your next appointment   for a checkup in 4 to 5 months     

## 2017-12-14 NOTE — Assessment & Plan Note (Signed)
DM: Currently on Actos, check a BMP, A1c and a TSH.  Last TSH was a slightly elevated. Dyslipidemia: On Lipitor, likes his FLP check to see about his triglycerides.  Will do RTC 5 months.

## 2018-01-26 ENCOUNTER — Other Ambulatory Visit: Payer: Self-pay | Admitting: Internal Medicine

## 2018-02-13 IMAGING — DX DG SHOULDER 2+V*R*
4 series · 4 of 4 positions shown · non-contrast
Comparison: None.

CLINICAL DATA: Chronic right shoulder pain

EXAM:
RIGHT SHOULDER - 2+ VIEW

[shoulder grashey]
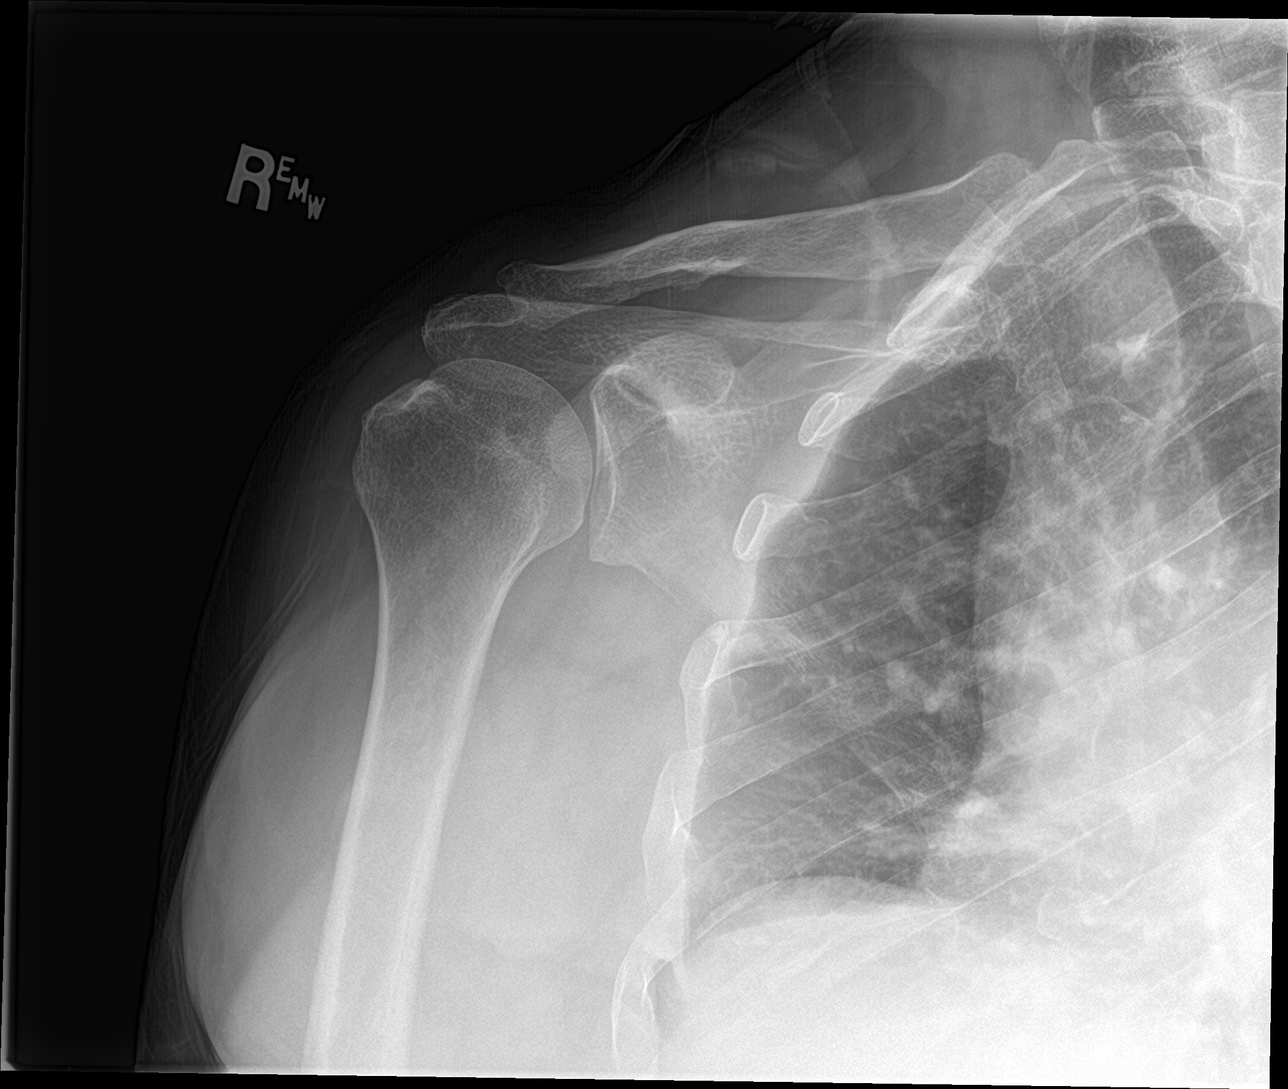

[shoulder y view (1 of 2)]
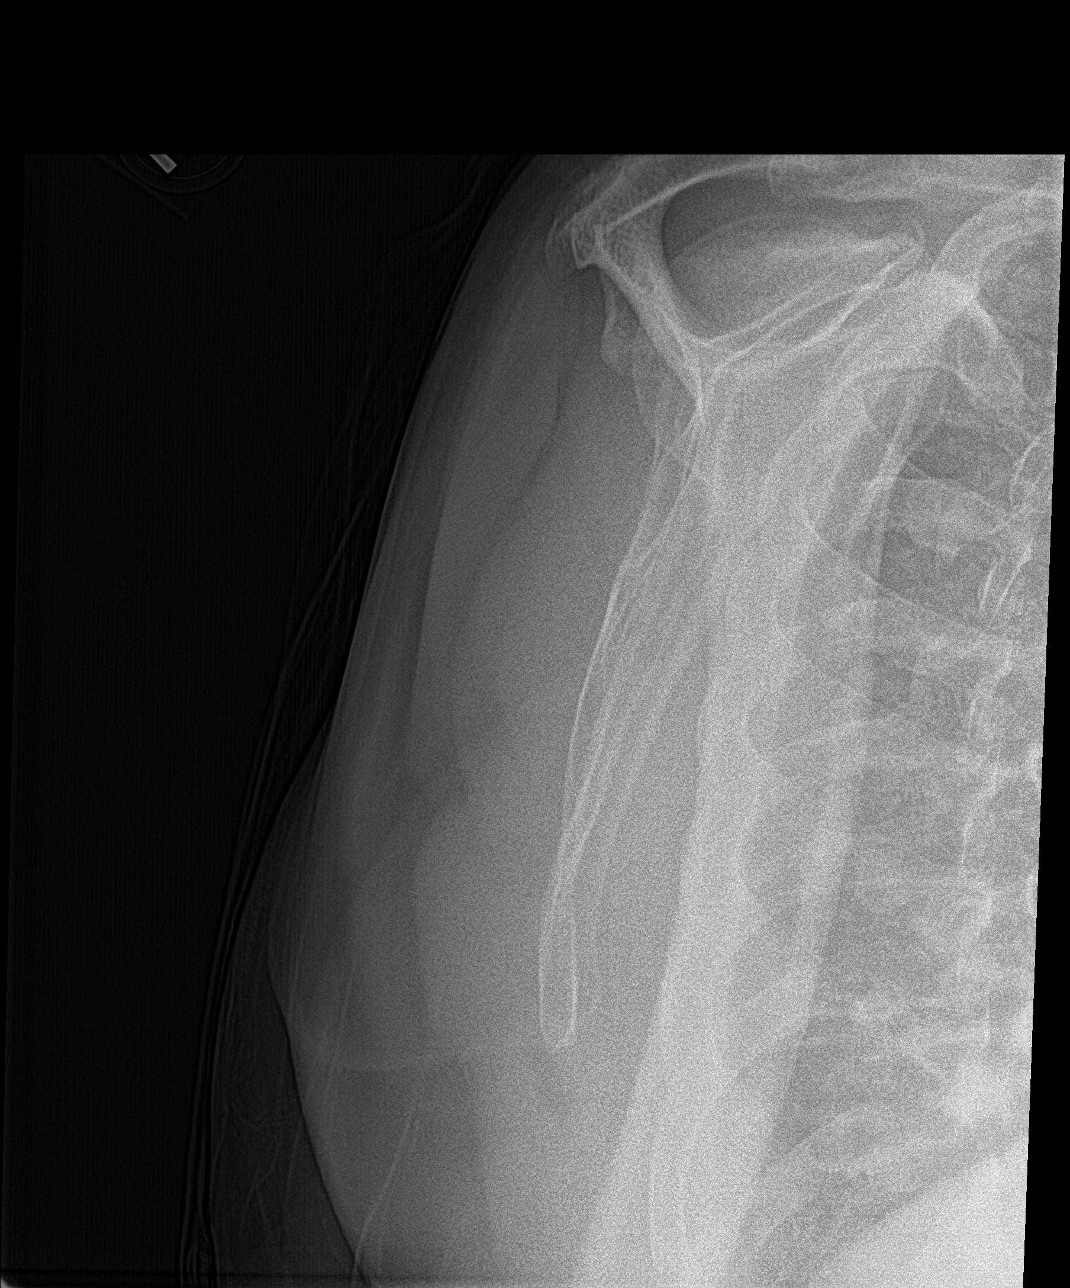

[shoulder ap neutral]
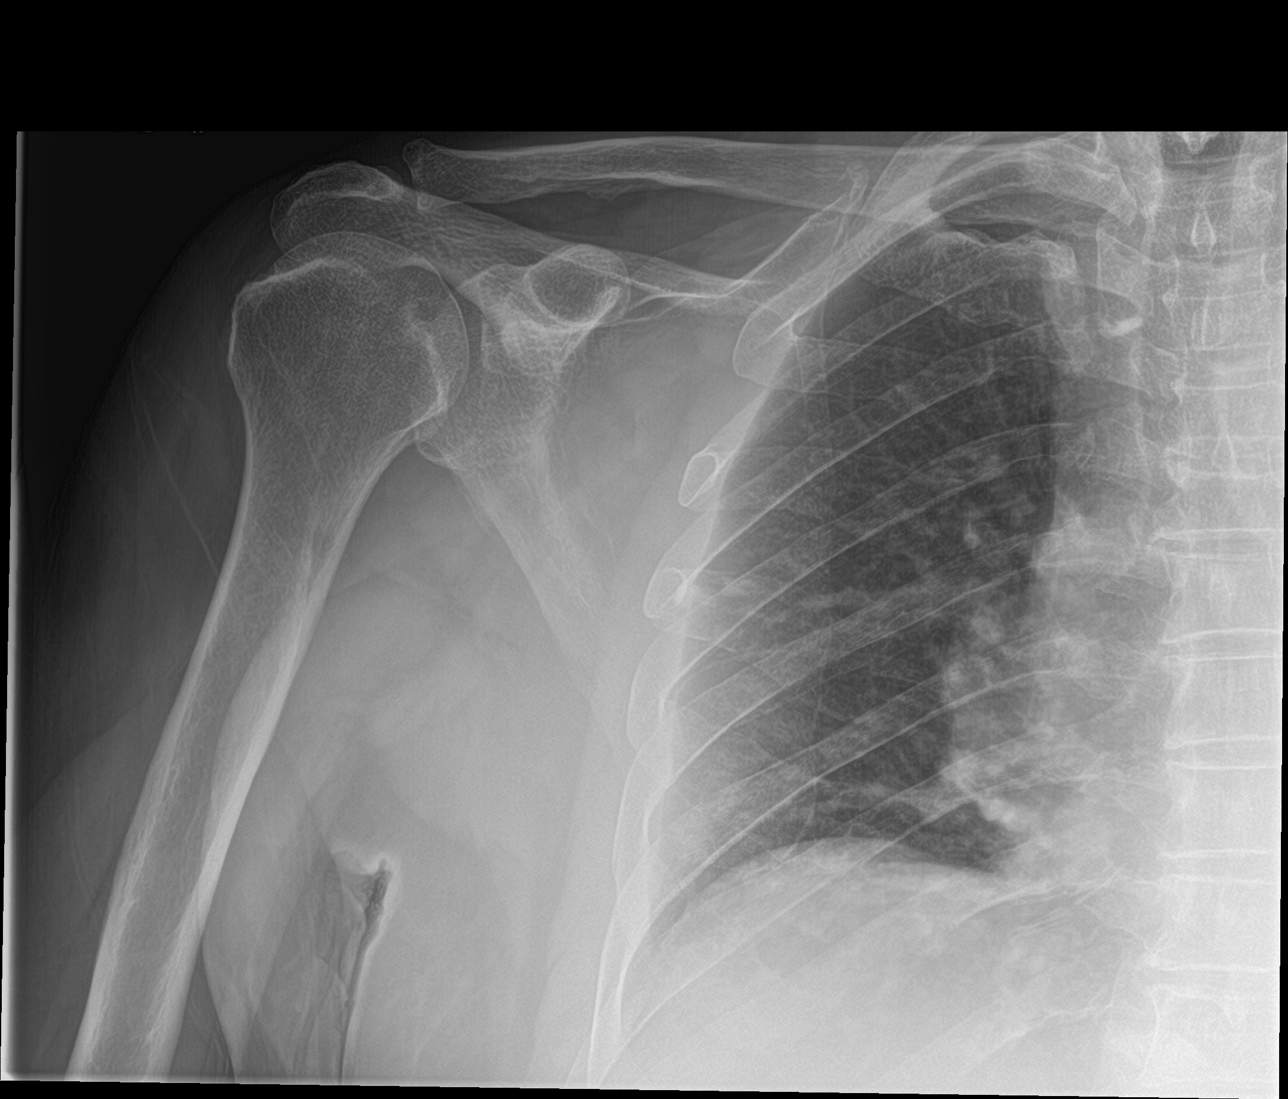

[shoulder y view (2 of 2)]
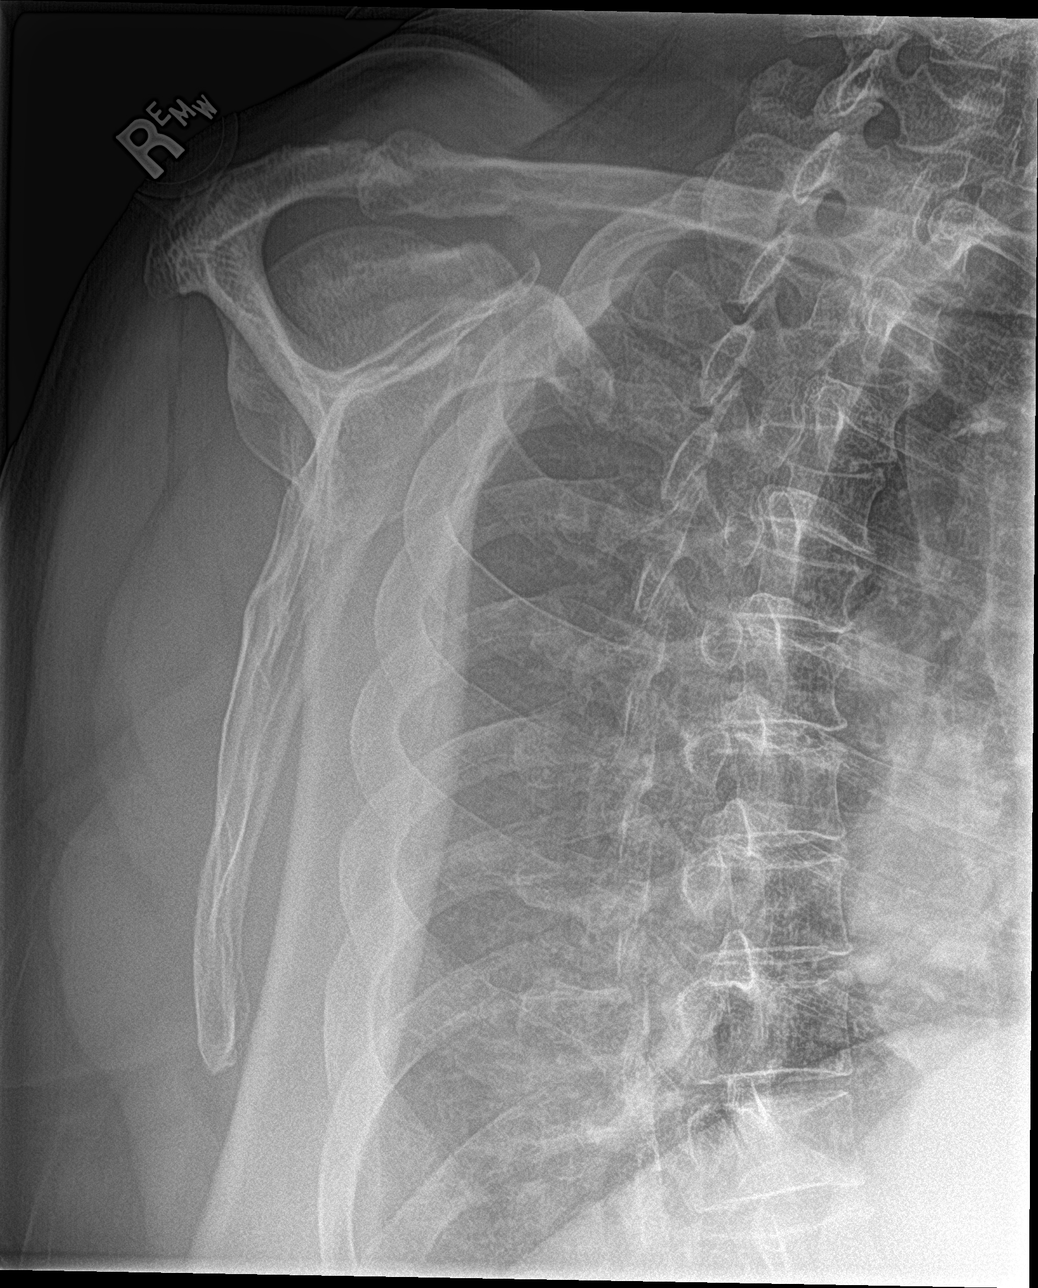

[4 of 4 positions shown; findings below may reference images not displayed]

FINDINGS: There is no evidence of fracture or dislocation. There is no
evidence of arthropathy or other focal bone abnormality. Soft
tissues are unremarkable.
IMPRESSION: No acute osseous injury of the right shoulder.

## 2018-04-17 ENCOUNTER — Encounter: Payer: Self-pay | Admitting: Internal Medicine

## 2018-04-17 ENCOUNTER — Ambulatory Visit (INDEPENDENT_AMBULATORY_CARE_PROVIDER_SITE_OTHER): Payer: Medicare HMO | Admitting: Internal Medicine

## 2018-04-17 VITALS — BP 130/72 | HR 64 | Temp 98.2°F | Resp 16 | Ht 67.0 in | Wt 202.1 lb

## 2018-04-17 DIAGNOSIS — E118 Type 2 diabetes mellitus with unspecified complications: Secondary | ICD-10-CM | POA: Diagnosis not present

## 2018-04-17 DIAGNOSIS — I10 Essential (primary) hypertension: Secondary | ICD-10-CM

## 2018-04-17 DIAGNOSIS — Z23 Encounter for immunization: Secondary | ICD-10-CM

## 2018-04-17 DIAGNOSIS — E559 Vitamin D deficiency, unspecified: Secondary | ICD-10-CM

## 2018-04-17 LAB — HEMOGLOBIN A1C: HEMOGLOBIN A1C: 6.1 % (ref 4.6–6.5)

## 2018-04-17 LAB — VITAMIN D 25 HYDROXY (VIT D DEFICIENCY, FRACTURES): VITD: 38.14 ng/mL (ref 30.00–100.00)

## 2018-04-17 NOTE — Progress Notes (Signed)
Pre visit review using our clinic review tool, if applicable. No additional management support is needed unless otherwise documented below in the visit note. 

## 2018-04-17 NOTE — Patient Instructions (Addendum)
GO TO THE LAB : Get the blood work     GO TO THE FRONT DESK Schedule your next appointment for a  Physical exam in 4 months    

## 2018-04-17 NOTE — Progress Notes (Signed)
Subjective:    Patient ID: Maurice BashMiguel A Nieves-Rodriguez, male    DOB: [birth year], 84 y.o.   MRN: [id number]  DOS:  04/17/2018 Type of visit - description : rov Interval history: Since the last office visit he is feeling well. Good compliance with medications. Takes meloxicam rarely.   Review of Systems Denies nausea, vomiting, diarrhea. No chest pain or difficulty breathing.   Past Medical History:  Diagnosis Date  . DJD (degenerative joint disease)   . Hyperlipidemia    Hypertriglyceridemia  . Hypertension   . Hypogonadism, male   . Osteoporosis   . Prediabetes 07/16/2013  . Rosacea     Past Surgical History:  Procedure Laterality Date  . CATARACT EXTRACTION     Bilaterally  . INGUINAL HERNIA REPAIR      Social History   Socioeconomic History  . Marital status: Married    Spouse name: Not on file  . Number of children: 2  . Years of education: Not on file  . Highest education level: Not on file  Occupational History  . Occupation: retired    Associate Professormployer: RETIRED  Social Needs  . Financial resource strain: Not on file  . Food insecurity:    Worry: Not on file    Inability: Not on file  . Transportation needs:    Medical: Not on file    Non-medical: Not on file  Tobacco Use  . Smoking status: Never Smoker  . Smokeless tobacco: Never Used  Substance and Sexual Activity  . Alcohol use: No    Comment: wine, occasionally  . Drug use: Not on file  . Sexual activity: Not on file  Lifestyle  . Physical activity:    Days per week: Not on file    Minutes per session: Not on file  . Stress: Not on file  Relationships  . Social connections:    Talks on phone: Not on file    Gets together: Not on file    Attends religious service: Not on file    Active member of club or organization: Not on file    Attends meetings of clubs or organizations: Not on file    Relationship status: Not on file  . Intimate partner violence:    Fear of current or ex partner: Not on  file    Emotionally abused: Not on file    Physically abused: Not on file    Forced sexual activity: Not on file  Other Topics Concern  . Not on file  Social History Narrative   [place]   Lives w/ wife and 1 daughter (bipolar)   Moved to [place] early 2008         Allergies as of 04/17/2018      Reactions   Ace Inhibitors    REACTION: cough   Amlodipine Besylate    REACTION: edema   Metformin And Related Nausea Only   Intolerant, no allergic reaction, see OV 08-30-17      Medication List        Accurate as of 04/17/18  1:09 PM. Always use your most recent med list.          aspirin 81 MG tablet Take 81 mg by mouth daily.   atorvastatin 10 MG tablet Commonly known as:  LIPITOR Take 1 tablet (10 mg total) by mouth daily.   cholecalciferol 1000 units tablet Commonly known as:  VITAMIN D Take 1,000 Units by mouth daily. Reported on 09/16/2015   losartan-hydrochlorothiazide 100-25 MG  tablet Commonly known as:  HYZAAR Take 1 tablet by mouth daily.   meloxicam 7.5 MG tablet Commonly known as:  MOBIC Take 1-2 tablets (7.5-15 mg total) by mouth daily as needed for pain.   pioglitazone 30 MG tablet Commonly known as:  ACTOS Take 1 tablet (30 mg total) by mouth daily.   potassium chloride 10 MEQ tablet Commonly known as:  K-DUR,KLOR-CON Take 1 tablet (10 mEq total) by mouth daily.   sildenafil 20 MG tablet Commonly known as:  REVATIO Take 3-4 tablets (60-80 mg total) by mouth at bedtime as needed.   Vitamin D (Ergocalciferol) 50000 units Caps capsule Commonly known as:  DRISDOL Take 1 capsule (50,000 Units total) by mouth every 7 (seven) days.          Objective:   Physical Exam BP 130/72 (BP Location: Left Arm, Patient Position: Sitting, Cuff Size: Small)   Pulse 64   Temp 98.2 F (36.8 C) (Oral)   Resp 16   Ht 5\' 7"  (1.702 m)   Wt 202 lb 2 oz (91.7 kg)   SpO2 96%   BMI 31.66 kg/m  General:   Well developed, NAD, see BMI.  HEENT:    Normocephalic . Face symmetric, atraumatic Lungs:  CTA B Normal respiratory effort, no intercostal retractions, no accessory muscle use. Heart: RRR,  no murmur.  No pretibial edema bilaterally  Skin: Not pale. Not jaundice Neurologic:  alert & oriented X3.  Speech normal, gait appropriate for age and unassisted Psych--  Cognition and judgment appear intact.  Cooperative with normal attention span and concentration.  Behavior appropriate. No anxious or depressed appearing.      Assessment & Plan:   Assessment  Diabetes: tried metformin, see note 08-30-17, has s/e, ok to try again if needed (low dose) HTN Dyslipidemia,  high triglycerides Hypogonadism- was unable to afford HRT 2012, no sx  Osteoporosis: T score -2.4  (05-2012). T score -2.3 (05-2016): Rx Ca, vitamin D Vitamin D deficiency DJD-- meloxicam prn Rosacea Increase LFTs -- First noted ~ 04-2015, no etoh/tylenol. changed simvastatin to Lipitor 04-2014 and Lipitor was d/c 2-17. We also d/c fenofibrates 09-2015 ; LFTs back to normal  01-2016  Fatty liver per [place] 07-18-2015 Hep B-C  Neg 07-2015;  ceruloplasmin, ferritin, iron and alpha-1 antitrypsin ---> (-) 09-2015  PLAN: DM: Last A1c was excellent, Actos decreased to 15 mg, he continue with a very good diet and stays active.  Check a A1c HTN: Currently well controlled on Hyzaar, potassium, last BMP satisfactory. Vitamin D deficiency: Status post ergocalciferol earlier this year, on daily vitamin D.  Checking labs Preventive care: Pneumonia shot 23 today. RTC 4 months

## 2018-04-19 ENCOUNTER — Encounter: Payer: Self-pay | Admitting: Internal Medicine

## 2018-04-19 NOTE — Assessment & Plan Note (Signed)
DM: Last A1c was excellent, Actos decreased to 15 mg, he continue with a very good diet and stays active.  Check a A1c HTN: Currently well controlled on Hyzaar, potassium, last BMP satisfactory. Vitamin D deficiency: Status post ergocalciferol earlier this year, on daily vitamin D.  Checking labs Preventive care: Pneumonia shot 23 today. RTC 4 months

## 2018-04-27 ENCOUNTER — Other Ambulatory Visit: Payer: Self-pay | Admitting: Internal Medicine

## 2018-04-27 DIAGNOSIS — E785 Hyperlipidemia, unspecified: Secondary | ICD-10-CM

## 2018-07-10 DIAGNOSIS — H35373 Puckering of macula, bilateral: Secondary | ICD-10-CM | POA: Diagnosis not present

## 2018-07-10 DIAGNOSIS — H353131 Nonexudative age-related macular degeneration, bilateral, early dry stage: Secondary | ICD-10-CM | POA: Diagnosis not present

## 2018-07-10 DIAGNOSIS — H35033 Hypertensive retinopathy, bilateral: Secondary | ICD-10-CM | POA: Diagnosis not present

## 2018-07-10 LAB — HM DIABETES EYE EXAM

## 2018-08-03 ENCOUNTER — Other Ambulatory Visit: Payer: Self-pay | Admitting: Internal Medicine

## 2018-08-18 ENCOUNTER — Other Ambulatory Visit: Payer: Self-pay

## 2018-08-18 ENCOUNTER — Encounter: Payer: Self-pay | Admitting: Internal Medicine

## 2018-08-18 ENCOUNTER — Ambulatory Visit (INDEPENDENT_AMBULATORY_CARE_PROVIDER_SITE_OTHER): Payer: Medicare HMO | Admitting: Internal Medicine

## 2018-08-18 VITALS — BP 130/81 | HR 55 | Temp 97.9°F | Resp 17 | Ht 67.0 in | Wt 202.5 lb

## 2018-08-18 DIAGNOSIS — E119 Type 2 diabetes mellitus without complications: Secondary | ICD-10-CM | POA: Diagnosis not present

## 2018-08-18 DIAGNOSIS — Z23 Encounter for immunization: Secondary | ICD-10-CM

## 2018-08-18 DIAGNOSIS — I10 Essential (primary) hypertension: Secondary | ICD-10-CM

## 2018-08-18 DIAGNOSIS — M8580 Other specified disorders of bone density and structure, unspecified site: Secondary | ICD-10-CM | POA: Diagnosis not present

## 2018-08-18 DIAGNOSIS — M810 Age-related osteoporosis without current pathological fracture: Secondary | ICD-10-CM

## 2018-08-18 LAB — CBC WITH DIFFERENTIAL/PLATELET
Basophils Absolute: 0 10*3/uL (ref 0.0–0.1)
Basophils Relative: 0.8 % (ref 0.0–3.0)
Eosinophils Absolute: 0.2 10*3/uL (ref 0.0–0.7)
Eosinophils Relative: 4.3 % (ref 0.0–5.0)
HCT: 43.1 % (ref 39.0–52.0)
Hemoglobin: 14.7 g/dL (ref 13.0–17.0)
LYMPHS ABS: 1.8 10*3/uL (ref 0.7–4.0)
Lymphocytes Relative: 33.1 % (ref 12.0–46.0)
MCHC: 34.2 g/dL (ref 30.0–36.0)
MCV: 89.6 fl (ref 78.0–100.0)
Monocytes Absolute: 0.6 10*3/uL (ref 0.1–1.0)
Monocytes Relative: 11.6 % (ref 3.0–12.0)
NEUTROS PCT: 50.2 % (ref 43.0–77.0)
Neutro Abs: 2.7 10*3/uL (ref 1.4–7.7)
Platelets: 243 10*3/uL (ref 150.0–400.0)
RBC: 4.81 Mil/uL (ref 4.22–5.81)
RDW: 13.6 % (ref 11.5–15.5)
WBC: 5.4 10*3/uL (ref 4.0–10.5)

## 2018-08-18 LAB — BASIC METABOLIC PANEL
BUN: 15 mg/dL (ref 6–23)
CALCIUM: 9.8 mg/dL (ref 8.4–10.5)
CO2: 29 mEq/L (ref 19–32)
CREATININE: 0.9 mg/dL (ref 0.40–1.50)
Chloride: 100 mEq/L (ref 96–112)
GFR: 80.53 mL/min (ref >60.00)
Glucose, Bld: 84 mg/dL (ref 70–99)
Potassium: 3.5 mEq/L (ref 3.5–5.1)
SODIUM: 138 meq/L (ref 135–145)

## 2018-08-18 LAB — HEMOGLOBIN A1C: Hgb A1c MFr Bld: 6.3 % (ref 4.6–6.5)

## 2018-08-18 MED ORDER — ZOSTER VAC RECOMB ADJUVANTED 50 MCG/0.5ML IM SUSR: 0.5000 mL | Freq: Once | INTRAMUSCULAR | 1 refills | Status: AC

## 2018-08-18 NOTE — Patient Instructions (Addendum)
  GO TO THE LAB : Get the blood work     

## 2018-08-18 NOTE — Progress Notes (Signed)
Subjective:    Patient ID: Maurice Colon, male    DOB: 01-16-1933, 85 y.o.   MRN: [id number]  DOS:  08/18/2018 Type of visit - description: Follow-up Since the last office visit he is feeling great. Good med compliance.  Review of Systems Denies chest pain no difficulty breathing No nausea, vomiting, diarrhea  Past Medical History:  Diagnosis Date  . DJD (degenerative joint disease)   . Hyperlipidemia    Hypertriglyceridemia  . Hypertension   . Hypogonadism, male   . Osteoporosis   . Prediabetes 07/16/2013  . Rosacea     Past Surgical History:  Procedure Laterality Date  . CATARACT EXTRACTION     Bilaterally  . INGUINAL HERNIA REPAIR      Social History   Socioeconomic History  . Marital status: Married    Spouse name: Not on file  . Number of children: 2  . Years of education: Not on file  . Highest education level: Not on file  Occupational History  . Occupation: retired    Associate Professormployer: RETIRED  Social Needs  . Financial resource strain: Not on file  . Food insecurity:    Worry: Not on file    Inability: Not on file  . Transportation needs:    Medical: Not on file    Non-medical: Not on file  Tobacco Use  . Smoking status: Never Smoker  . Smokeless tobacco: Never Used  Substance and Sexual Activity  . Alcohol use: No    Comment: wine, occasionally  . Drug use: Not on file  . Sexual activity: Not on file  Lifestyle  . Physical activity:    Days per week: Not on file    Minutes per session: Not on file  . Stress: Not on file  Relationships  . Social connections:    Talks on phone: Not on file    Gets together: Not on file    Attends religious service: Not on file    Active member of club or organization: Not on file    Attends meetings of clubs or organizations: Not on file    Relationship status: Not on file  . Intimate partner violence:    Fear of current or ex partner: Not on file    Emotionally abused: Not on file    Physically  abused: Not on file    Forced sexual activity: Not on file  Other Topics Concern  . Not on file  Social History Narrative   Original [place]   Lives w/ wife and 1 daughter (bipolar)   Moved to [place] early 2008         Allergies as of 08/18/2018      Reactions   Ace Inhibitors    REACTION: cough   Amlodipine Besylate    REACTION: edema   Metformin And Related Nausea Only   Intolerant, no allergic reaction, see OV 08-30-17      Medication List       Accurate as of August 18, 2018 11:59 PM. Always use your most recent med list.        aspirin 81 MG tablet Take 81 mg by mouth daily.   atorvastatin 10 MG tablet Commonly known as:  LIPITOR TAKE 1 TABLET (10 MG TOTAL) BY MOUTH DAILY.   cholecalciferol 1000 units tablet Commonly known as:  VITAMIN D Take 1,000 Units by mouth daily. Reported on 09/16/2015   losartan-hydrochlorothiazide 100-25 MG tablet Commonly known as:  HYZAAR TAKE 1 TABLET EVERY DAY  meloxicam 7.5 MG tablet Commonly known as:  MOBIC Take 1-2 tablets (7.5-15 mg total) by mouth daily as needed for pain.   pioglitazone 30 MG tablet Commonly known as:  ACTOS Take 15 mg by mouth daily.   potassium chloride 10 MEQ tablet Commonly known as:  K-DUR,KLOR-CON Take 1 tablet (10 mEq total) by mouth daily.   sildenafil 20 MG tablet Commonly known as:  REVATIO Take 3-4 tablets (60-80 mg total) by mouth at bedtime as needed.   Zoster Vaccine Adjuvanted injection Commonly known as:  SHINGRIX Inject 0.5 mLs into the muscle once for 1 dose.           Objective:   Physical Exam BP 130/81   Pulse (!) 55   Temp 97.9 F (36.6 C) (Oral)   Resp 17   Ht 5\' 7"  (1.702 m)   Wt 202 lb 8 oz (91.9 kg)   SpO2 98%   BMI 31.72 kg/m  General:   Well developed, NAD, BMI noted. HEENT:  Normocephalic . Face symmetric, atraumatic Lungs:  CTA B Normal respiratory effort, no intercostal retractions, no accessory muscle use. Heart: RRR,  no murmur.  No  pretibial edema bilaterally  Skin: Not pale. Not jaundice Neurologic:  alert & oriented X3.  Speech normal, gait appropriate for age and unassisted Psych--  Cognition and judgment appear intact.  Cooperative with normal attention span and concentration.  Behavior appropriate. No anxious or depressed appearing.      Assessment     Assessment  Diabetes: tried metformin, see note 08-30-17, has s/e, ok to try again if needed (low dose) HTN Dyslipidemia,  high triglycerides Hypogonadism- was unable to afford HRT 2012, no sx  Osteoporosis: T score -2.4  (05-2012). T score -2.3 (05-2016): Rx Ca, vitamin D Vitamin D deficiency DJD-- meloxicam prn Rosacea Increase LFTs -- First noted ~ 04-2015, no etoh/tylenol. changed simvastatin to Lipitor 04-2014 and Lipitor was d/c 2-17. We also d/c fenofibrates 09-2015 ; LFTs back to normal  01-2016  Fatty liver per [place] 07-18-2015 Hep B-C  Neg 07-2015;  ceruloplasmin, ferritin, iron and alpha-1 antitrypsin ---> (-) 09-2015  PLAN: DM: Continue Actos, check A1c. HTN: Continue Hyzaar, potassium.  Check a BMP and CBC Osteopenia: Last T score was in 2017, -2.3, on calcium and vitamin D. Check a bone density test. Preventive care:  Flu shot today Shingrix prescription printed.  Benefit discussed. RTC 6 months fasting.

## 2018-08-19 NOTE — Assessment & Plan Note (Signed)
DM: Continue Actos, check A1c. HTN: Continue Hyzaar, potassium.  Check a BMP and CBC Osteopenia: Last T score was in 2017, -2.3, on calcium and vitamin D. Check a bone density test. Preventive care:  Flu shot today Shingrix prescription printed.  Benefit discussed. RTC 6 months fasting.

## 2018-09-28 ENCOUNTER — Telehealth: Payer: Self-pay

## 2018-09-28 NOTE — Telephone Encounter (Signed)
Called pt.'s mobile number with Spanish interpreter [employer] # [phone]. Message left to call and discuss his symptoms.

## 2018-09-28 NOTE — Telephone Encounter (Signed)
Pt has appt tomorrow, notes state he is not feeling well. Can he be triaged for further information?

## 2018-09-28 NOTE — Telephone Encounter (Signed)
Attempted to call pt. With Spanish interpreter - phone is busy. Unable to leave message.

## 2018-09-29 ENCOUNTER — Ambulatory Visit (INDEPENDENT_AMBULATORY_CARE_PROVIDER_SITE_OTHER): Payer: Medicare HMO | Admitting: Internal Medicine

## 2018-09-29 ENCOUNTER — Encounter: Payer: Self-pay | Admitting: Internal Medicine

## 2018-09-29 ENCOUNTER — Ambulatory Visit (HOSPITAL_BASED_OUTPATIENT_CLINIC_OR_DEPARTMENT_OTHER)
Admission: RE | Admit: 2018-09-29 | Discharge: 2018-09-29 | Disposition: A | Discharge status: Home / Self Care | Payer: Medicare HMO | Source: Ambulatory Visit | Attending: Internal Medicine | Admitting: Internal Medicine

## 2018-09-29 VITALS — BP 142/77 | HR 72 | Temp 97.7°F | Resp 16 | Ht 67.0 in | Wt 200.4 lb

## 2018-09-29 DIAGNOSIS — R6883 Chills (without fever): Secondary | ICD-10-CM | POA: Diagnosis not present

## 2018-09-29 DIAGNOSIS — R21 Rash and other nonspecific skin eruption: Secondary | ICD-10-CM

## 2018-09-29 DIAGNOSIS — M791 Myalgia, unspecified site: Secondary | ICD-10-CM | POA: Diagnosis not present

## 2018-09-29 LAB — COMPREHENSIVE METABOLIC PANEL
ALT: 31 U/L (ref 0–53)
AST: 30 U/L (ref 0–37)
Albumin: 4.4 g/dL (ref 3.5–5.2)
Alkaline Phosphatase: 87 U/L (ref 39–117)
BUN: 15 mg/dL (ref 6–23)
CO2: 28 meq/L (ref 19–32)
Calcium: 9.5 mg/dL (ref 8.4–10.5)
Chloride: 97 mEq/L (ref 96–112)
Creatinine, Ser: 1.08 mg/dL (ref 0.40–1.50)
GFR: 65.23 mL/min (ref >60.00)
Glucose, Bld: 120 mg/dL — ABNORMAL HIGH (ref 70–99)
Potassium: 3.7 mEq/L (ref 3.5–5.1)
Sodium: 135 mEq/L (ref 135–145)
Total Bilirubin: 1.3 mg/dL — ABNORMAL HIGH (ref 0.2–1.2)
Total Protein: 7.8 g/dL (ref 6.0–8.3)

## 2018-09-29 LAB — SEDIMENTATION RATE: Sed Rate: 34 mm/hr — ABNORMAL HIGH (ref 0–20)

## 2018-09-29 LAB — CBC WITH DIFFERENTIAL/PLATELET
Basophils Absolute: 0 10*3/uL (ref 0.0–0.1)
Basophils Relative: 0.5 % (ref 0.0–3.0)
Eosinophils Absolute: 0.1 10*3/uL (ref 0.0–0.7)
Eosinophils Relative: 1.7 % (ref 0.0–5.0)
HCT: 44.7 % (ref 39.0–52.0)
Hemoglobin: 15 g/dL (ref 13.0–17.0)
Lymphocytes Relative: 23 % (ref 12.0–46.0)
Lymphs Abs: 1.6 10*3/uL (ref 0.7–4.0)
MCHC: 33.6 g/dL (ref 30.0–36.0)
MCV: 90.2 fl (ref 78.0–100.0)
Monocytes Absolute: 0.7 10*3/uL (ref 0.1–1.0)
Monocytes Relative: 9.3 % (ref 3.0–12.0)
Neutro Abs: 4.6 10*3/uL (ref 1.4–7.7)
Neutrophils Relative %: 65.5 % (ref 43.0–77.0)
Platelets: 249 10*3/uL (ref 150.0–400.0)
RBC: 4.96 Mil/uL (ref 4.22–5.81)
RDW: 13.3 % (ref 11.5–15.5)
WBC: 7 10*3/uL (ref 4.0–10.5)

## 2018-09-29 LAB — URINALYSIS, ROUTINE W REFLEX MICROSCOPIC
Bilirubin Urine: NEGATIVE
Hgb urine dipstick: NEGATIVE
Ketones, ur: NEGATIVE
Leukocytes,Ua: NEGATIVE
Nitrite: NEGATIVE
Specific Gravity, Urine: 1.015 (ref 1.000–1.030)
Total Protein, Urine: NEGATIVE
Urine Glucose: NEGATIVE
Urobilinogen, UA: 0.2 (ref 0.0–1.0)
pH: 7.5 (ref 5.0–8.0)

## 2018-09-29 LAB — CK: CK TOTAL: 205 U/L (ref 7–232)

## 2018-09-29 LAB — POCT RAPID STREP A (OFFICE): Rapid Strep A Screen: NEGATIVE

## 2018-09-29 MED ORDER — VALACYCLOVIR HCL 1 G PO TABS: 1000.0000 mg | ORAL_TABLET | Freq: Three times a day (TID) | ORAL | 0 refills | Status: DC

## 2018-09-29 NOTE — Patient Instructions (Addendum)
Please schedule Medicare Wellness with Lawanna Kobus.   GO TO THE LAB : Get the blood work     STOP BY THE FIRST FLOOR:  get the XR    tome valtrex

## 2018-09-29 NOTE — Telephone Encounter (Signed)
Pt seen today

## 2018-09-29 NOTE — Progress Notes (Signed)
Subjective:    Patient ID: Maurice Colon, male    DOB: 13-Aug-1932, 85 y.o.   MRN: [id number]  DOS:  09/29/2018 Type of visit - description: acute Symptoms that started acutely 2 weeks ago with sudden onset of chills follow-up by generalized arthralgias and myalgias. Symptoms are worse when he tries to move.  Arthralgias are symmetric and affecting large joints such as shoulders, elbows, hips, knees.  Also at the back. Myalgias are generalized including cramps on the abdominal wall when he tries to bend. He is not taking any new medications. No exposure to sick people, no travels outside the county, no recent tick bite.  He does have a rash at the right buttock, this is on and off for 2 years, typically start as a couple of blisters that gradually dry up.  Most recent episode started 2 weeks ago.  See picture.  Review of Systems Denies fever chills No sinus pain or congestion No sore throat No chest pain no difficulty breathing No nausea, vomiting, diarrhea.  No abdominal pain. No headache, dizziness.  Past Medical History:  Diagnosis Date  . DJD (degenerative joint disease)   . Hyperlipidemia    Hypertriglyceridemia  . Hypertension   . Hypogonadism, male   . Osteoporosis   . Prediabetes 07/16/2013  . Rosacea     Past Surgical History:  Procedure Laterality Date  . CATARACT EXTRACTION     Bilaterally  . INGUINAL HERNIA REPAIR      Social History   Socioeconomic History  . Marital status: Married    Spouse name: Not on file  . Number of children: 2  . Years of education: Not on file  . Highest education level: Not on file  Occupational History  . Occupation: retired    Associate Professor: RETIRED  Social Needs  . Financial resource strain: Not on file  . Food insecurity:    Worry: Not on file    Inability: Not on file  . Transportation needs:    Medical: Not on file    Non-medical: Not on file  Tobacco Use  . Smoking status: Never Smoker  . Smokeless  tobacco: Never Used  Substance and Sexual Activity  . Alcohol use: No    Comment: wine, occasionally  . Drug use: Not on file  . Sexual activity: Not on file  Lifestyle  . Physical activity:    Days per week: Not on file    Minutes per session: Not on file  . Stress: Not on file  Relationships  . Social connections:    Talks on phone: Not on file    Gets together: Not on file    Attends religious service: Not on file    Active member of club or organization: Not on file    Attends meetings of clubs or organizations: Not on file    Relationship status: Not on file  . Intimate partner violence:    Fear of current or ex partner: Not on file    Emotionally abused: Not on file    Physically abused: Not on file    Forced sexual activity: Not on file  Other Topics Concern  . Not on file  Social History Narrative   [place]   Lives w/ wife and 1 daughter (bipolar)   Moved to [place] early 2008         Allergies as of 09/29/2018      Reactions   Ace Inhibitors    REACTION: cough  Amlodipine Besylate    REACTION: edema   Metformin And Related Nausea Only   Intolerant, no allergic reaction, see OV 08-30-17      Medication List       Accurate as of September 29, 2018 11:59 PM. Always use your most recent med list.        aspirin 81 MG tablet Take 81 mg by mouth daily.   atorvastatin 10 MG tablet Commonly known as:  LIPITOR TAKE 1 TABLET (10 MG TOTAL) BY MOUTH DAILY.   cholecalciferol 1000 units tablet Commonly known as:  VITAMIN D Take 1,000 Units by mouth daily. Reported on 09/16/2015   losartan-hydrochlorothiazide 100-25 MG tablet Commonly known as:  HYZAAR TAKE 1 TABLET EVERY DAY   meloxicam 7.5 MG tablet Commonly known as:  MOBIC Take 1-2 tablets (7.5-15 mg total) by mouth daily as needed for pain.   pioglitazone 30 MG tablet Commonly known as:  ACTOS Take 15 mg by mouth daily.   potassium chloride 10 MEQ tablet Commonly known as:   K-DUR,KLOR-CON Take 1 tablet (10 mEq total) by mouth daily.   sildenafil 20 MG tablet Commonly known as:  REVATIO Take 3-4 tablets (60-80 mg total) by mouth at bedtime as needed.   valACYclovir 1000 MG tablet Commonly known as:  VALTREX Take 1 tablet (1,000 mg total) by mouth 3 (three) times daily.           Objective:   Physical Exam BP (!) 142/77 (BP Location: Left Arm, Patient Position: Sitting, Cuff Size: Small)   Pulse 72   Temp 97.7 F (36.5 C) (Oral)   Resp 16   Ht 5\' 7"  (1.702 m)   Wt 200 lb 6 oz (90.9 kg)   SpO2 95%   BMI 31.38 kg/m  General:   Well developed, NAD, BMI noted.  HEENT:  Normocephalic . Face symmetric, atraumatic Neck: No thyromegaly Lymphatic system: No lymphadenopathies at the neck, axillary and groin areas. Lungs:  CTA B Normal respiratory effort, no intercostal retractions, no accessory muscle use. Heart: RRR,  no murmur.  no pretibial edema bilaterally  Abdomen:  Not distended, soft, non-tender. No rebound or rigidity.   Skin: See right buttock picture, has a couple of erythematous lesions (resolving blisters) MSK: Hands and wrists without synovitis Neurologic:  alert & oriented X3.  Speech normal, gait appropriate for age and unassisted Psych--  Cognition and judgment appear intact.  Cooperative with normal attention span and concentration.  Behavior appropriate. No anxious or depressed appearing.       Assessment     Assessment  Diabetes: tried metformin, see note 08-30-17, has s/e, ok to try again if needed (low dose) HTN Dyslipidemia,  high triglycerides Hypogonadism- was unable to afford HRT 2012, no sx  Osteoporosis: T score -2.4  (05-2012). T score -2.3 (05-2016): Rx Ca, vitamin D Vitamin D deficiency DJD-- meloxicam prn Rosacea Increase LFTs -- First noted ~ 04-2015, no etoh/tylenol. changed simvastatin to Lipitor 04-2014 and Lipitor was d/c 2-17. We also d/c fenofibrates 09-2015 ; LFTs back to normal  01-2016  Fatty  liver per [place] 07-18-2015 Hep B-C  Neg 07-2015;  ceruloplasmin, ferritin, iron and alpha-1 antitrypsin ---> (-) 09-2015  PLAN: Acute onset of symmetric large joint polyarthralgia, myalgia and chills: currently with no fevers, not toxic appearing. DDX is large from  acute viral versus other infections. Autoimmune arthritis? Others?Marland Kitchen Unclear if the rash (on and off for 2 years) is related to acute symptoms. Rapid strep test (-) Plan: CBC, CMP,  sed rate, UA,CKs urine culture, CXR Call next week let me know how he is doing.  Okay to take Tylenol ER if severe symptoms, fever, chills, rash, headache. All of the above discussed in Spanish patient in agreement Rash, buttock: Likely a herpetic rash.  Prescribed Valtrex for 1 week.

## 2018-09-29 NOTE — Progress Notes (Signed)
Pre visit review using our clinic review tool, if applicable. No additional management support is needed unless otherwise documented below in the visit note. 

## 2018-09-30 NOTE — Assessment & Plan Note (Signed)
Acute onset of symmetric large joint polyarthralgia, myalgia and chills: currently with no fevers, not toxic appearing. DDX is large from  acute viral versus other infections. Autoimmune arthritis? Others?Marland Kitchen Unclear if the rash (on and off for 2 years) is related to acute symptoms. Rapid strep test (-) Plan: CBC, CMP, sed rate, UA,CKs urine culture, CXR Call next week let me know how he is doing.  Okay to take Tylenol ER if severe symptoms, fever, chills, rash, headache. All of the above discussed in Spanish patient in agreement Rash, buttock: Likely a herpetic rash.  Prescribed Valtrex for 1 week.

## 2018-10-01 LAB — URINE CULTURE
MICRO NUMBER:: 256583
SPECIMEN QUALITY:: ADEQUATE

## 2018-10-02 ENCOUNTER — Other Ambulatory Visit: Payer: Self-pay

## 2018-10-02 MED ORDER — AMOXICILLIN-POT CLAVULANATE 875-125 MG PO TABS: 1.0000 | ORAL_TABLET | Freq: Two times a day (BID) | ORAL | 0 refills | Status: AC

## 2018-10-04 ENCOUNTER — Encounter: Payer: Self-pay | Admitting: Internal Medicine

## 2018-10-16 ENCOUNTER — Telehealth: Payer: Self-pay | Admitting: Internal Medicine

## 2018-10-16 NOTE — Telephone Encounter (Signed)
Call patient to see how he is doing, recently had a urinary infection.  I ask patient to call back to the office and let us know how he is doing.

## 2018-10-20 NOTE — Telephone Encounter (Signed)
Maurice Colon please call pt, see how he is doing

## 2018-10-20 NOTE — Telephone Encounter (Signed)
Per wife "all symptoms are now gone and patient is much better"

## 2018-10-20 NOTE — Telephone Encounter (Signed)
thx

## 2018-11-01 ENCOUNTER — Other Ambulatory Visit: Payer: Self-pay | Admitting: Internal Medicine

## 2018-11-01 DIAGNOSIS — E785 Hyperlipidemia, unspecified: Secondary | ICD-10-CM

## 2019-01-20 ENCOUNTER — Other Ambulatory Visit: Payer: Self-pay | Admitting: Internal Medicine

## 2019-02-16 ENCOUNTER — Ambulatory Visit (INDEPENDENT_AMBULATORY_CARE_PROVIDER_SITE_OTHER): Payer: Medicare HMO | Admitting: Internal Medicine

## 2019-02-16 ENCOUNTER — Encounter: Payer: Self-pay | Admitting: Internal Medicine

## 2019-02-16 ENCOUNTER — Other Ambulatory Visit: Payer: Self-pay

## 2019-02-16 ENCOUNTER — Ambulatory Visit: Payer: Medicaid Other | Admitting: Internal Medicine

## 2019-02-16 VITALS — BP 193/90 | HR 63 | Temp 98.0°F | Resp 16 | Ht 67.0 in | Wt 199.2 lb

## 2019-02-16 DIAGNOSIS — R7989 Other specified abnormal findings of blood chemistry: Secondary | ICD-10-CM

## 2019-02-16 DIAGNOSIS — E785 Hyperlipidemia, unspecified: Secondary | ICD-10-CM

## 2019-02-16 DIAGNOSIS — E119 Type 2 diabetes mellitus without complications: Secondary | ICD-10-CM | POA: Diagnosis not present

## 2019-02-16 DIAGNOSIS — I10 Essential (primary) hypertension: Secondary | ICD-10-CM

## 2019-02-16 LAB — LIPID PANEL
Cholesterol: 127 mg/dL (ref 0–200)
HDL: 30.8 mg/dL — ABNORMAL LOW (ref >39.00)
NonHDL: 96.56
Total CHOL/HDL Ratio: 4
Triglycerides: 276 mg/dL — ABNORMAL HIGH (ref 0.0–149.0)
VLDL: 55.2 mg/dL — ABNORMAL HIGH (ref 0.0–40.0)

## 2019-02-16 LAB — BASIC METABOLIC PANEL
BUN: 18 mg/dL (ref 6–23)
CO2: 25 mEq/L (ref 19–32)
Calcium: 9.4 mg/dL (ref 8.4–10.5)
Chloride: 103 mEq/L (ref 96–112)
Creatinine, Ser: 0.92 mg/dL (ref 0.40–1.50)
GFR: 78.42 mL/min (ref >60.00)
Glucose, Bld: 115 mg/dL — ABNORMAL HIGH (ref 70–99)
Potassium: 3.4 mEq/L — ABNORMAL LOW (ref 3.5–5.1)
Sodium: 139 mEq/L (ref 135–145)

## 2019-02-16 LAB — TSH: TSH: 3.8 u[IU]/mL (ref 0.35–4.50)

## 2019-02-16 LAB — LDL CHOLESTEROL, DIRECT: Direct LDL: 48 mg/dL

## 2019-02-16 LAB — HEMOGLOBIN A1C: Hgb A1c MFr Bld: 5.9 % (ref 4.6–6.5)

## 2019-02-16 MED ORDER — TERAZOSIN HCL 1 MG PO CAPS: 1.0000 mg | ORAL_CAPSULE | Freq: Every day | ORAL | 3 refills | Status: DC

## 2019-02-16 NOTE — Progress Notes (Signed)
Subjective:    Patient ID: Maurice Colon, male    DOB: 07-25-1933, 85 y.o.   MRN: [id number]  DOS:  02/16/2019 Type of visit - description: f/u HTN: Good med compliance, ambulatory BP slightly elevated in the last few days, yesterday systolic was 149 DM: Self discontinued Actos 4 weeks ago. UTI: All symptoms gone.  BP Readings from Last 3 Encounters:  02/16/19 (!) 193/90  09/29/18 (!) 142/77  08/18/18 130/81     Review of Systems Admits to poor diet lately Denies chest pain, lower extremity edema or difficulty breathing No nausea, vomiting or diarrhea.   Past Medical History:  Diagnosis Date  . DJD (degenerative joint disease)   . Hyperlipidemia    Hypertriglyceridemia  . Hypertension   . Hypogonadism, male   . Osteoporosis   . Prediabetes 07/16/2013  . Rosacea     Past Surgical History:  Procedure Laterality Date  . CATARACT EXTRACTION     Bilaterally  . INGUINAL HERNIA REPAIR      Social History   Socioeconomic History  . Marital status: Married    Spouse name: Not on file  . Number of children: 2  . Years of education: Not on file  . Highest education level: Not on file  Occupational History  . Occupation: retired    Associate Professormployer: RETIRED  Social Needs  . Financial resource strain: Not on file  . Food insecurity    Worry: Not on file    Inability: Not on file  . Transportation needs    Medical: Not on file    Non-medical: Not on file  Tobacco Use  . Smoking status: Never Smoker  . Smokeless tobacco: Never Used  Substance and Sexual Activity  . Alcohol use: No    Comment: wine, occasionally  . Drug use: Not on file  . Sexual activity: Not on file  Lifestyle  . Physical activity    Days per week: Not on file    Minutes per session: Not on file  . Stress: Not on file  Relationships  . Social Musicianconnections    Talks on phone: Not on file    Gets together: Not on file    Attends religious service: Not on file    Active member of club  or organization: Not on file    Attends meetings of clubs or organizations: Not on file    Relationship status: Not on file  . Intimate partner violence    Fear of current or ex partner: Not on file    Emotionally abused: Not on file    Physically abused: Not on file    Forced sexual activity: Not on file  Other Topics Concern  . Not on file  Social History Narrative   Original from [place]   Lives w/ wife and 1 daughter (bipolar)   Moved to [place] early 2008         Allergies as of 02/16/2019      Reactions   Ace Inhibitors    REACTION: cough   Amlodipine Besylate    REACTION: edema   Metformin And Related Nausea Only   Intolerant, no allergic reaction, see OV 08-30-17      Medication List       Accurate as of February 16, 2019 11:59 PM. If you have any questions, ask your nurse or doctor.        STOP taking these medications   pioglitazone 30 MG tablet Commonly known as: ACTOS Stopped by: Elita QuickJose  Larose Kells, MD   valACYclovir 1000 MG tablet Commonly known as: Valtrex Stopped by: Kathlene November, MD     TAKE these medications   aspirin 81 MG tablet Take 81 mg by mouth daily.   atorvastatin 10 MG tablet Commonly known as: LIPITOR Take 1 tablet (10 mg total) by mouth daily.   cholecalciferol 1000 units tablet Commonly known as: VITAMIN D Take 1,000 Units by mouth daily. Reported on 09/16/2015   losartan-hydrochlorothiazide 100-25 MG tablet Commonly known as: HYZAAR Take 1 tablet by mouth daily.   meloxicam 7.5 MG tablet Commonly known as: MOBIC Take 1-2 tablets (7.5-15 mg total) by mouth daily as needed for pain.   potassium chloride 10 MEQ tablet Commonly known as: K-DUR Take 1 tablet (10 mEq total) by mouth daily.   sildenafil 20 MG tablet Commonly known as: REVATIO Take 3-4 tablets (60-80 mg total) by mouth at bedtime as needed.   terazosin 1 MG capsule Commonly known as: HYTRIN Take 1 capsule (1 mg total) by mouth at bedtime. Started by: Kathlene November, MD            Objective:   Physical Exam BP (!) 193/90 (BP Location: Left Arm, Patient Position: Sitting, Cuff Size: Small)   Pulse 63   Temp 98 F (36.7 C) (Oral)   Resp 16   Ht 5\' 7"  (1.702 m)   Wt 199 lb 4 oz (90.4 kg)   SpO2 94%   BMI 31.21 kg/m  General:   Well developed, NAD, BMI noted. HEENT:  Normocephalic . Face symmetric, atraumatic Lungs:  CTA B Normal respiratory effort, no intercostal retractions, no accessory muscle use. Heart: RRR,  no murmur.  No pretibial edema bilaterally  Skin: Not pale. Not jaundice Neurologic:  alert & oriented X3.  Speech normal, gait appropriate for age and unassisted Psych--  Cognition and judgment appear intact.  Cooperative with normal attention span and concentration.  Behavior appropriate. No anxious or depressed appearing.      Assessment     Assessment  Diabetes: tried metformin, see note 08-30-17, has s/e, ok to try again if needed (low dose) HTN: amlodipine: edema  Dyslipidemia,  high triglycerides Hypogonadism- was unable to afford HRT 2012, no sx  Osteoporosis: T score -2.4  (05-2012). T score -2.3 (05-2016): Rx Ca, vitamin D Vitamin D deficiency DJD-- meloxicam prn Rosacea Increase LFTs -- First noted ~ 04-2015, no etoh/tylenol. changed simvastatin to Lipitor 04-2014 and Lipitor was d/c 2-17. We also d/c fenofibrates 09-2015 ; LFTs back to normal  01-2016  Fatty liver per [place] 07-18-2015 Hep B-C  Neg 07-2015;  ceruloplasmin, ferritin, iron and alpha-1 antitrypsin ---> (-) 09-2015  PLAN: DM: Was recommended to take Actos 30 mg half tablet daily, tablet  was very hard to break it in half, eventually stopped taking Actos 4 weeks ago.  Diet not the best lately, patient educated about it.  Will check a A1c. HTN: Reports good compliance with Hyzaar, potassium supplements.  Not doing well with low-salt diet, patient educated. BP when he arrived to the clinic 192/90, I recheck it: 170/90.  Yesterday at home systolic was 229. We will add  terazosin at bedtime.  Nurse visit in 3 weeks, BMP. High cholesterol: On Lipitor check a FLP last LFTs normal. DJD: Takes meloxicam only as needed Slightly increased TSH: Recheck today Preventive care: Encourage early flu shot. RTC 4 months

## 2019-02-16 NOTE — Patient Instructions (Addendum)
GO TO THE LAB : Get the blood work     GO TO THE FRONT DESK  NURSE VISIT in 3 weeks for BP check  Schedule your next appointment   For a check up in 4 months      ADD TERAZOSIN one at night  Check the  blood pressure 2 or 3 times a   week   GOAL  is between 110/65 and  135/85.    LOW SALT AND SUGAR DIET

## 2019-02-16 NOTE — Progress Notes (Signed)
Pre visit review using our clinic review tool, if applicable. No additional management support is needed unless otherwise documented below in the visit note. 

## 2019-02-17 NOTE — Assessment & Plan Note (Signed)
DM: Was recommended to take Actos 30 mg half tablet daily, tablet  was very hard to break it in half, eventually stopped taking Actos 4 weeks ago.  Diet not the best lately, patient educated about it.  Will check a A1c. HTN: Reports good compliance with Hyzaar, potassium supplements.  Not doing well with low-salt diet, patient educated. BP when he arrived to the clinic 192/90, I recheck it: 170/90.  Yesterday at home systolic was 109. We will add terazosin at bedtime.  Nurse visit in 3 weeks, BMP. High cholesterol: On Lipitor check a FLP last LFTs normal. DJD: Takes meloxicam only as needed Slightly increased TSH: Recheck today Preventive care: Encourage early flu shot. RTC 4 months

## 2019-03-09 ENCOUNTER — Other Ambulatory Visit: Payer: Self-pay

## 2019-03-09 ENCOUNTER — Ambulatory Visit (INDEPENDENT_AMBULATORY_CARE_PROVIDER_SITE_OTHER): Payer: Medicare HMO | Admitting: Internal Medicine

## 2019-03-09 DIAGNOSIS — I10 Essential (primary) hypertension: Secondary | ICD-10-CM | POA: Diagnosis not present

## 2019-03-09 MED ORDER — TERAZOSIN HCL 1 MG PO CAPS: 1.0000 mg | ORAL_CAPSULE | Freq: Two times a day (BID) | ORAL | 1 refills | Status: DC

## 2019-03-09 NOTE — Progress Notes (Signed)
Pt here for Blood pressure check per Dr. Larose Kells  Pt currently takes: Hyzaar 100-25mg  daily and Hytrin 1mg  daily.   Pt reports compliance with medication.  BP today @ =177/83  HR =66  I rechecked manually it was 174/80  Pt advised per Dr. Victory Dakin compliance with medication. BP at home in the 140s, here elevated. Plan:  Increase Hytrin 1 mg to 2 tablets daily, send a new prescription, Monitor BPs at home, call with readings in 3 weeks Needs a follow-up by November Maurice Paz, MD

## 2019-03-30 ENCOUNTER — Encounter: Payer: Self-pay | Admitting: Internal Medicine

## 2019-03-30 ENCOUNTER — Other Ambulatory Visit: Payer: Self-pay

## 2019-03-30 ENCOUNTER — Ambulatory Visit (INDEPENDENT_AMBULATORY_CARE_PROVIDER_SITE_OTHER): Payer: Medicare HMO | Admitting: Internal Medicine

## 2019-03-30 VITALS — BP 164/64 | HR 56 | Temp 97.9°F | Resp 16 | Ht 67.0 in | Wt 200.4 lb

## 2019-03-30 DIAGNOSIS — Z23 Encounter for immunization: Secondary | ICD-10-CM

## 2019-03-30 DIAGNOSIS — E119 Type 2 diabetes mellitus without complications: Secondary | ICD-10-CM | POA: Diagnosis not present

## 2019-03-30 DIAGNOSIS — I441 Atrioventricular block, second degree: Secondary | ICD-10-CM | POA: Diagnosis not present

## 2019-03-30 DIAGNOSIS — I10 Essential (primary) hypertension: Secondary | ICD-10-CM

## 2019-03-30 NOTE — Patient Instructions (Addendum)
Please schedule Medicare Wellness with Glenard Haring.   Take your medications as prescribed  Call anytime if you feel weak, dizzy

## 2019-03-30 NOTE — Progress Notes (Signed)
Pre visit review using our clinic review tool, if applicable. No additional management support is needed unless otherwise documented below in the visit note. 

## 2019-03-30 NOTE — Progress Notes (Signed)
Subjective:    Patient ID: Maurice Colon, male    DOB: 10/10/1932, 85 y.o.   MRN: [id number]  DOS:  03/30/2019 Type of visit - description: Follow-up Here for hypertension management. At the last visit we started Terazosin, subsequently it was increased to twice daily. Patient decided to take it only once a day because he felt his diastolic BP was 69, too low.    Review of Systems Denies chest pain, difficulty breathing.  No edema No headache or dizziness  Past Medical History:  Diagnosis Date   DJD (degenerative joint disease)    Hyperlipidemia    Hypertriglyceridemia   Hypertension    Hypogonadism, male    Osteoporosis    Prediabetes 07/16/2013   Rosacea     Past Surgical History:  Procedure Laterality Date   CATARACT EXTRACTION     Bilaterally   INGUINAL HERNIA REPAIR      Social History   Socioeconomic History   Marital status: Married    Spouse name: Not on file   Number of children: 2   Years of education: Not on file   Highest education level: Not on file  Occupational History   Occupation: retired    Fish farm manager: RETIRED  Scientist, product/process development strain: Not on file   Food insecurity    Worry: Not on file    Inability: Not on file   Transportation needs    Medical: Not on file    Non-medical: Not on file  Tobacco Use   Smoking status: Never Smoker   Smokeless tobacco: Never Used  Substance and Sexual Activity   Alcohol use: No    Comment: wine, occasionally   Drug use: Not on file   Sexual activity: Not on file  Lifestyle   Physical activity    Days per week: Not on file    Minutes per session: Not on file   Stress: Not on file  Relationships   Social connections    Talks on phone: Not on file    Gets together: Not on file    Attends religious service: Not on file    Active member of club or organization: Not on file    Attends meetings of clubs or organizations: Not on file    Relationship  status: Not on file   Intimate partner violence    Fear of current or ex partner: Not on file    Emotionally abused: Not on file    Physically abused: Not on file    Forced sexual activity: Not on file  Other Topics Concern   Not on file  Social History Narrative   Original from [place]   Lives w/ wife and 1 daughter (bipolar)   Moved to [place] early 2008         Allergies as of 03/30/2019      Reactions   Ace Inhibitors    REACTION: cough   Amlodipine Besylate    REACTION: edema   Metformin And Related Nausea Only   Intolerant, no allergic reaction, see OV 08-30-17      Medication List       Accurate as of March 30, 2019 11:21 AM. If you have any questions, ask your nurse or doctor.        aspirin 81 MG tablet Take 81 mg by mouth daily.   atorvastatin 10 MG tablet Commonly known as: LIPITOR Take 1 tablet (10 mg total) by mouth daily.   cholecalciferol 1000 units tablet  Commonly known as: VITAMIN D Take 1,000 Units by mouth daily. Reported on 09/16/2015   losartan-hydrochlorothiazide 100-25 MG tablet Commonly known as: HYZAAR Take 1 tablet by mouth daily.   meloxicam 7.5 MG tablet Commonly known as: MOBIC Take 1-2 tablets (7.5-15 mg total) by mouth daily as needed for pain.   potassium chloride 10 MEQ tablet Commonly known as: K-DUR Take 1 tablet (10 mEq total) by mouth daily.   sildenafil 20 MG tablet Commonly known as: REVATIO Take 3-4 tablets (60-80 mg total) by mouth at bedtime as needed.   terazosin 1 MG capsule Commonly known as: HYTRIN Take 1 capsule (1 mg total) by mouth 2 (two) times daily. What changed: Another medication with the same name was removed. Continue taking this medication, and follow the directions you see here. Changed by: Willow OraJose Dianelly Ferran, MD           Objective:   Physical Exam BP (!) 164/64 (BP Location: Right Arm, Patient Position: Sitting, Cuff Size: Small)    Pulse (!) 56    Temp 97.9 F (36.6 C) (Temporal)    Resp 16     Ht 5\' 7"  (1.702 m)    Wt 200 lb 6 oz (90.9 kg)    SpO2 96%    BMI 31.38 kg/m  General:   Well developed, NAD, BMI noted. HEENT:  Normocephalic . Face symmetric, atraumatic Lungs:  CTA B Normal respiratory effort, no intercostal retractions, no accessory muscle use. Heart: Slightly bradycardic,  no murmur.  No pretibial edema bilaterally  Skin: Not pale. Not jaundice Neurologic:  alert & oriented X3.  Speech normal, gait appropriate for age and unassisted Psych--  Cognition and judgment appear intact.  Cooperative with normal attention span and concentration.  Behavior appropriate. No anxious or depressed appearing.       Assessment     Assessment  Diabetes: tried metformin, see note 08-30-17, has s/e, ok to try again if needed (low dose) HTN: amlodipine: edema  Dyslipidemia,  high triglycerides Hypogonadism- was unable to afford HRT 2012, no sx  Osteoporosis: T score -2.4  (05-2012). T score -2.3 (05-2016): Rx Ca, vitamin D Vitamin D deficiency DJD-- meloxicam prn Rosacea Increase LFTs -- First noted ~ 04-2015, no etoh/tylenol. changed simvastatin to Lipitor 04-2014 and Lipitor was d/c 2-17. We also d/c fenofibrates 09-2015 ; LFTs back to normal  01-2016  Fatty liver per [place] 07-18-2015 Hep B-C  Neg 07-2015;  ceruloplasmin, ferritin, iron and alpha-1 antitrypsin ---> (-) 09-2015  PLAN: HTN:  started terazosin, subsequently increased from once daily to B.I.D. Patient is actually taking it only once daily , he felt a DBP of 69 was too low.  Good compliance with Hyzaar and potassium. Ambulatory BPs range from 135, 148.  DBP typically 69 or 70. rec ok to take terazosin BID  Continue same meds including terazosin twice daily Bradycardia:EKG today: Discussed with cardiology, Wenckebach  Since he is asx, no further eval is needed.  Watch for dizziness, syncope. Preventive care: Flu shot today RTC 06/19/2019 as a schedule

## 2019-03-31 DIAGNOSIS — I441 Atrioventricular block, second degree: Secondary | ICD-10-CM | POA: Insufficient documentation

## 2019-04-02 NOTE — Assessment & Plan Note (Signed)
HTN:  started terazosin, subsequently increased from once daily to B.I.D. Patient is actually taking it only once daily , he felt a DBP of 69 was too low.  Good compliance with Hyzaar and potassium. Ambulatory BPs range from 135, 148.  DBP typically 69 or 70. rec ok to take terazosin BID  Continue same meds including terazosin twice daily Bradycardia:EKG today: Discussed with cardiology, Wenckebach  Since he is asx, no further eval is needed.  Watch for dizziness, syncope. Preventive care: Flu shot today RTC 06/19/2019 as a schedule

## 2019-05-10 ENCOUNTER — Other Ambulatory Visit: Payer: Self-pay | Admitting: Internal Medicine

## 2019-05-10 DIAGNOSIS — E785 Hyperlipidemia, unspecified: Secondary | ICD-10-CM

## 2019-06-19 ENCOUNTER — Ambulatory Visit (INDEPENDENT_AMBULATORY_CARE_PROVIDER_SITE_OTHER): Payer: Medicare HMO | Admitting: Internal Medicine

## 2019-06-19 ENCOUNTER — Encounter: Payer: Self-pay | Admitting: Internal Medicine

## 2019-06-19 ENCOUNTER — Other Ambulatory Visit: Payer: Self-pay

## 2019-06-19 VITALS — BP 169/62 | HR 90 | Temp 97.1°F | Resp 16 | Ht 67.0 in | Wt 203.4 lb

## 2019-06-19 DIAGNOSIS — E559 Vitamin D deficiency, unspecified: Secondary | ICD-10-CM | POA: Diagnosis not present

## 2019-06-19 DIAGNOSIS — E119 Type 2 diabetes mellitus without complications: Secondary | ICD-10-CM | POA: Diagnosis not present

## 2019-06-19 DIAGNOSIS — I10 Essential (primary) hypertension: Secondary | ICD-10-CM | POA: Diagnosis not present

## 2019-06-19 LAB — BASIC METABOLIC PANEL
BUN: 29 mg/dL — ABNORMAL HIGH (ref 6–23)
CO2: 27 mEq/L (ref 19–32)
Calcium: 9.2 mg/dL (ref 8.4–10.5)
Chloride: 104 mEq/L (ref 96–112)
Creatinine, Ser: 1.83 mg/dL — ABNORMAL HIGH (ref 0.40–1.50)
GFR: 35.43 mL/min — ABNORMAL LOW (ref >60.00)
Glucose, Bld: 99 mg/dL (ref 70–99)
Potassium: 3.8 mEq/L (ref 3.5–5.1)
Sodium: 141 mEq/L (ref 135–145)

## 2019-06-19 LAB — VITAMIN D 25 HYDROXY (VIT D DEFICIENCY, FRACTURES): VITD: 34.21 ng/mL (ref 30.00–100.00)

## 2019-06-19 MED ORDER — MELOXICAM 7.5 MG PO TABS: 7.5000 mg | ORAL_TABLET | Freq: Every day | ORAL | 0 refills | Status: DC | PRN

## 2019-06-19 NOTE — Patient Instructions (Addendum)
Please schedule Medicare Wellness with Glenard Haring.   Per our records you are due for an eye exam. Please contact your eye doctor to schedule an appointment. Please have them send copies of your office visit notes to [place]. Our fax number is [phone].   GO TO THE LAB : Get the blood work     GO TO THE FRONT DESK Schedule your next appointment for a for physical exam 4-5 months, fasting.   Continue checking your blood pressure once or twice a week  BP GOAL is between 110/65 and  135/85. If it is consistently higher or lower, let me know

## 2019-06-19 NOTE — Progress Notes (Signed)
Subjective:    Patient ID: Maurice Colon, male    DOB: [birth year], 85 y.o.   MRN: [id number]  DOS:  06/19/2019 Type of visit - description: Follow-up Here mostly to follow-up on his blood pressure. Reports good compliance with medications. No apparent side effects. DJD: Request a refill of meloxicam, takes as needed only.  Review of Systems Denies chest pain or difficulty breathing No edema No nausea, vomiting, diarrhea  Past Medical History:  Diagnosis Date  . DJD (degenerative joint disease)   . Hyperlipidemia    Hypertriglyceridemia  . Hypertension   . Hypogonadism, male   . Osteoporosis   . Prediabetes 07/16/2013  . Rosacea     Past Surgical History:  Procedure Laterality Date  . CATARACT EXTRACTION     Bilaterally  . INGUINAL HERNIA REPAIR      Social History   Socioeconomic History  . Marital status: Married    Spouse name: Not on file  . Number of children: 2  . Years of education: Not on file  . Highest education level: Not on file  Occupational History  . Occupation: retired    Fish farm manager: RETIRED  Social Needs  . Financial resource strain: Not on file  . Food insecurity    Worry: Not on file    Inability: Not on file  . Transportation needs    Medical: Not on file    Non-medical: Not on file  Tobacco Use  . Smoking status: Never Smoker  . Smokeless tobacco: Never Used  Substance and Sexual Activity  . Alcohol use: No    Comment: wine, occasionally  . Drug use: Not on file  . Sexual activity: Not on file  Lifestyle  . Physical activity    Days per week: Not on file    Minutes per session: Not on file  . Stress: Not on file  Relationships  . Social Herbalist on phone: Not on file    Gets together: Not on file    Attends religious service: Not on file    Active member of club or organization: Not on file    Attends meetings of clubs or organizations: Not on file    Relationship status: Not on file  . Intimate  partner violence    Fear of current or ex partner: Not on file    Emotionally abused: Not on file    Physically abused: Not on file    Forced sexual activity: Not on file  Other Topics Concern  . Not on file  Social History Narrative   Original from [place]   Lives w/ wife and 1 daughter (bipolar)   Moved to [place] early 2008         Allergies as of 06/19/2019      Reactions   Ace Inhibitors    REACTION: cough   Amlodipine Besylate    REACTION: edema   Metformin And Related Nausea Only   Intolerant, no allergic reaction, see OV 08-30-17      Medication List       Accurate as of June 19, 2019  9:12 AM. If you have any questions, ask your nurse or doctor.        aspirin 81 MG tablet Take 81 mg by mouth daily.   atorvastatin 10 MG tablet Commonly known as: LIPITOR Take 1 tablet (10 mg total) by mouth daily.   cholecalciferol 1000 units tablet Commonly known as: VITAMIN D Take 1,000 Units by mouth daily.  Reported on 09/16/2015   losartan-hydrochlorothiazide 100-25 MG tablet Commonly known as: HYZAAR Take 1 tablet by mouth daily.   meloxicam 7.5 MG tablet Commonly known as: MOBIC Take 1-2 tablets (7.5-15 mg total) by mouth daily as needed for pain.   potassium chloride 10 MEQ tablet Commonly known as: KLOR-CON Take 1 tablet (10 mEq total) by mouth daily.   sildenafil 20 MG tablet Commonly known as: REVATIO Take 3-4 tablets (60-80 mg total) by mouth at bedtime as needed.   terazosin 1 MG capsule Commonly known as: HYTRIN Take 1 capsule (1 mg total) by mouth 2 (two) times daily.           Objective:   Physical Exam BP (!) 169/62 (BP Location: Left Arm, Patient Position: Sitting, Cuff Size: Normal)   Pulse 90   Temp (!) 97.1 F (36.2 C) (Temporal)   Resp 16   Ht 5\' 7"  (1.702 m)   Wt 203 lb 6 oz (92.3 kg)   SpO2 93%   BMI 31.85 kg/m  General:   Well developed, NAD, BMI noted. HEENT:  Normocephalic . Face symmetric, atraumatic Lungs:  CTA  B Normal respiratory effort, no intercostal retractions, no accessory muscle use. Heart: RRR, occasionally skipped beat,  no murmur.  No pretibial edema bilaterally  Skin: Not pale. Not jaundice Neurologic:  alert & oriented X3.  Speech normal, gait appropriate for age and unassisted Psych--  Cognition and judgment appear intact.  Cooperative with normal attention span and concentration.  Behavior appropriate. No anxious or depressed appearing.      Assessment     Assessment  Diabetes: tried metformin, see note 08-30-17, has s/e, ok to try again if needed (low dose) HTN: amlodipine: edema  Dyslipidemia,  high triglycerides Hypogonadism- was unable to afford HRT 2012, no sx  Osteoporosis: T score -2.4  (05-2012). T score -2.3 (05-2016): Rx Ca, vitamin D Vitamin D deficiency DJD-- meloxicam prn Rosacea Increase LFTs -- First noted ~ 04-2015, no etoh/tylenol. changed simvastatin to Lipitor 04-2014 and Lipitor was d/c 2-17. We also d/c fenofibrates 09-2015 ; LFTs back to normal  01-2016  Fatty liver per 02-17-2002 07-18-2015 Hep B-C  Neg 07-2015;  ceruloplasmin, ferritin, iron and alpha-1 antitrypsin ---> (-) 09-2015 EKG 2020, Wenckebach  PLAN: HTN: Be elevated today, at home is consistently in the 130s/70-80s.  Continue Hyzaar, Terazosin twice a day, potassium.  Check a BMP Vitamin D deficiency: On supplements, check labs RTC 5 months CPX

## 2019-06-19 NOTE — Progress Notes (Signed)
Pre visit review using our clinic review tool, if applicable. No additional management support is needed unless otherwise documented below in the visit note. 

## 2019-06-20 NOTE — Assessment & Plan Note (Signed)
HTN: Be elevated today, at home is consistently in the 130s/70-80s.  Continue Hyzaar, Terazosin twice a day, potassium.  Check a BMP Vitamin D deficiency: On supplements, check labs RTC 5 months CPX

## 2019-06-21 NOTE — Addendum Note (Signed)
Addended byDamita Dunnings D on: 06/21/2019 04:18 PM   Modules accepted: Orders

## 2019-06-26 ENCOUNTER — Telehealth: Payer: Self-pay | Admitting: Internal Medicine

## 2019-06-26 NOTE — Telephone Encounter (Signed)
Send a letter, in a Spanish as follows:  Sr [place], por favor llame a la oficina y haga una cita para sscarse una prueba de sangre la primera semana de [place] . Esto se debe a que en la ultima prueba que le hicimos have una semana el rin~on estaba un poco afectado.

## 2019-06-26 NOTE — Progress Notes (Signed)
Called pt twice no answer. No VM set up to leave message.

## 2019-06-26 NOTE — Telephone Encounter (Signed)
Called pt twice to schedule lab appt in 2 wks no answer. No VM set up to leave message.

## 2019-06-26 NOTE — Telephone Encounter (Signed)
FYI

## 2019-06-26 NOTE — Telephone Encounter (Signed)
Letter mailed

## 2019-07-09 DIAGNOSIS — H35373 Puckering of macula, bilateral: Secondary | ICD-10-CM | POA: Diagnosis not present

## 2019-07-09 DIAGNOSIS — H35033 Hypertensive retinopathy, bilateral: Secondary | ICD-10-CM | POA: Diagnosis not present

## 2019-07-09 DIAGNOSIS — H353131 Nonexudative age-related macular degeneration, bilateral, early dry stage: Secondary | ICD-10-CM | POA: Diagnosis not present

## 2019-07-13 ENCOUNTER — Ambulatory Visit: Payer: Medicare HMO | Admitting: Internal Medicine

## 2019-07-16 ENCOUNTER — Other Ambulatory Visit (INDEPENDENT_AMBULATORY_CARE_PROVIDER_SITE_OTHER): Payer: Medicare HMO

## 2019-07-16 ENCOUNTER — Other Ambulatory Visit: Payer: Self-pay

## 2019-07-16 DIAGNOSIS — I10 Essential (primary) hypertension: Secondary | ICD-10-CM

## 2019-07-16 LAB — BASIC METABOLIC PANEL
BUN: 17 mg/dL (ref 6–23)
CO2: 25 mEq/L (ref 19–32)
Calcium: 9.5 mg/dL (ref 8.4–10.5)
Chloride: 101 mEq/L (ref 96–112)
Creatinine, Ser: 1.18 mg/dL (ref 0.40–1.50)
GFR: 58.78 mL/min — ABNORMAL LOW (ref >60.00)
Glucose, Bld: 109 mg/dL — ABNORMAL HIGH (ref 70–99)
Potassium: 3.9 mEq/L (ref 3.5–5.1)
Sodium: 137 mEq/L (ref 135–145)

## 2019-08-08 ENCOUNTER — Other Ambulatory Visit: Payer: Self-pay | Admitting: *Deleted

## 2019-08-08 NOTE — Telephone Encounter (Signed)
Received request from [facility] for Meloxicam refill. Rx pended for PCP approval.

## 2019-08-10 MED ORDER — MELOXICAM 7.5 MG PO TABS: 7.5000 mg | ORAL_TABLET | Freq: Every day | ORAL | 1 refills | Status: DC | PRN

## 2019-11-01 ENCOUNTER — Other Ambulatory Visit: Payer: Self-pay | Admitting: Internal Medicine

## 2019-11-01 DIAGNOSIS — E785 Hyperlipidemia, unspecified: Secondary | ICD-10-CM

## 2019-11-22 ENCOUNTER — Other Ambulatory Visit: Payer: Self-pay

## 2019-11-23 ENCOUNTER — Encounter: Payer: Self-pay | Admitting: Internal Medicine

## 2019-11-23 ENCOUNTER — Ambulatory Visit (INDEPENDENT_AMBULATORY_CARE_PROVIDER_SITE_OTHER): Payer: Medicare HMO | Admitting: Internal Medicine

## 2019-11-23 VITALS — BP 185/84 | HR 55 | Temp 98.2°F | Resp 18 | Ht 67.0 in | Wt 198.4 lb

## 2019-11-23 DIAGNOSIS — M25511 Pain in right shoulder: Secondary | ICD-10-CM

## 2019-11-23 DIAGNOSIS — M810 Age-related osteoporosis without current pathological fracture: Secondary | ICD-10-CM | POA: Diagnosis not present

## 2019-11-23 DIAGNOSIS — I10 Essential (primary) hypertension: Secondary | ICD-10-CM

## 2019-11-23 LAB — BASIC METABOLIC PANEL
BUN: 18 mg/dL (ref 6–23)
CO2: 29 mEq/L (ref 19–32)
Calcium: 9.7 mg/dL (ref 8.4–10.5)
Chloride: 102 mEq/L (ref 96–112)
Creatinine, Ser: 1.05 mg/dL (ref 0.40–1.50)
GFR: 67.2 mL/min (ref >60.00)
Glucose, Bld: 81 mg/dL (ref 70–99)
Potassium: 3.9 mEq/L (ref 3.5–5.1)
Sodium: 139 mEq/L (ref 135–145)

## 2019-11-23 NOTE — Progress Notes (Signed)
Pre visit review using our clinic review tool, if applicable. No additional management support is needed unless otherwise documented below in the visit note. 

## 2019-11-23 NOTE — Progress Notes (Signed)
Subjective:    Patient ID: Maurice Colon, male    DOB: 08-21-1932, 86 y.o.   MRN: [id number]  DOS:  11/23/2019 Type of visit - description: Acute Severe left shoulder pain, acute onset about 3 months ago when he was throwing a bag away with forceful external rotation of the left shoulder. Since then he is hurting with range of motion, also hurts at night. He can do heavy lifting with his biceps without any problems. Taking frequently meloxicam, Tylenol, Advil.  BP noted to be elevated, typically however is okay.  Review of Systems See above   Past Medical History:  Diagnosis Date  . DJD (degenerative joint disease)   . Hyperlipidemia    Hypertriglyceridemia  . Hypertension   . Hypogonadism, male   . Osteoporosis   . Prediabetes 07/16/2013  . Rosacea     Past Surgical History:  Procedure Laterality Date  . CATARACT EXTRACTION     Bilaterally  . INGUINAL HERNIA REPAIR      Allergies as of 11/23/2019      Reactions   Ace Inhibitors    REACTION: cough   Amlodipine Besylate    REACTION: edema   Metformin And Related Nausea Only   Intolerant, no allergic reaction, see OV 08-30-17      Medication List       Accurate as of November 23, 2019 11:59 PM. If you have any questions, ask your nurse or doctor.        aspirin 81 MG tablet Take 81 mg by mouth daily.   atorvastatin 10 MG tablet Commonly known as: LIPITOR Take 1 tablet (10 mg total) by mouth daily.   cholecalciferol 1000 units tablet Commonly known as: VITAMIN D Take 1,000 Units by mouth daily. Reported on 09/16/2015   losartan-hydrochlorothiazide 100-25 MG tablet Commonly known as: HYZAAR Take 1 tablet by mouth daily.   meloxicam 7.5 MG tablet Commonly known as: MOBIC Take 1-2 tablets (7.5-15 mg total) by mouth daily as needed for pain.   potassium chloride 10 MEQ tablet Commonly known as: KLOR-CON Take 1 tablet (10 mEq total) by mouth daily.   sildenafil 20 MG tablet Commonly known as:  REVATIO Take 3-4 tablets (60-80 mg total) by mouth at bedtime as needed.   terazosin 1 MG capsule Commonly known as: HYTRIN Take 1 capsule (1 mg total) by mouth 2 (two) times daily.          Objective:   Physical Exam BP (!) 185/84 (BP Location: Right Arm, Patient Position: Sitting, Cuff Size: Normal)   Pulse (!) 55   Temp 98.2 F (36.8 C) (Temporal)   Resp 18   Ht 5\' 7"  (1.702 m)   Wt 198 lb 6 oz (90 kg)   SpO2 97%   BMI 31.07 kg/m  General:   Well developed, NAD, BMI noted. HEENT:  Normocephalic . Face symmetric, atraumatic MSK: Right shoulder normal Left shoulder: Range of motion slightly decreased due to pain. + TTP anteriorly at the bicipital groove and AC joint. Motor exam upper extremities normal Lower extremities: no pretibial edema bilaterally  Skin: Not pale. Not jaundice Neurologic:  alert & oriented X3.  Speech normal, gait appropriate for age and unassisted Psych--  Cognition and judgment appear intact.  Cooperative with normal attention span and concentration.  Behavior appropriate. No anxious or depressed appearing.      Assessment      Assessment  Diabetes: tried metformin, see note 08-30-17, has s/e, ok to try again if needed (  low dose) HTN: amlodipine: edema  Dyslipidemia,  high triglycerides Hypogonadism- was unable to afford HRT 2012, no sx  Osteoporosis: T score -2.4  (05-2012). T score -2.3 (05-2016): Rx Ca, vitamin D Vitamin D deficiency DJD-- meloxicam prn Rosacea Increase LFTs -- First noted ~ 04-2015, no etoh/tylenol. changed simvastatin to Lipitor 04-2014 and Lipitor was d/c 2-17. We also d/c fenofibrates 09-2015 ; LFTs back to normal  01-2016  Fatty liver per [place] 07-18-2015 Hep B-C  Neg 07-2015;  ceruloplasmin, ferritin, iron and alpha-1 antitrypsin ---> (-) 09-2015 EKG 2020, Wenckebach  PLAN: Left shoulder pain: Acute onset 3 months ago, suspect rotator cuff injury.  Refer to sports medicine. Recommend to limit meloxicam to three  times a week, the rest of the time use Tylenol.  See next HTN: Elevated today, fortunately at home is typically okay.  Elevated BP related to NSAIDs?Marland Kitchen Plan: Continue Hyzaar, potassium, Terazosin, check BMP Osteoporosis: Due for a DEXA, will order on RTC, on chart review he was able to take Fosamax in 2013 and 2014 but never did so consistently. RTC scheduled for 01-2020 CPX  This visit occurred during the SARS-CoV-2 public health emergency.  Safety protocols were in place, including screening questions prior to the visit, additional usage of staff PPE, and extensive cleaning of exam room while observing appropriate contact time as indicated for disinfecting solutions.

## 2019-11-23 NOTE — Patient Instructions (Addendum)
Per our records you are due for an eye exam. Please contact your eye doctor to schedule an appointment. Please have them send copies of your office visit notes to [place]. Our fax number is [phone]. Check the  blood pressure 2 or 3 times a month week monthly weekly daily   Check your blood pressure three times a week BP GOAL is between 110/65 and  135/85. If it is consistently higher or lower, let me know  For pain: Tylenol  500 mg OTC 2 tabs a day every 8 hours as needed for pain Try to limit meloxicam to 3-4 times a week   GO TO THE LAB : Get the blood work

## 2019-11-25 NOTE — Assessment & Plan Note (Signed)
Left shoulder pain: Acute onset 3 months ago, suspect rotator cuff injury.  Refer to sports medicine. Recommend to limit meloxicam to three times a week, the rest of the time use Tylenol.  See next HTN: Elevated today, fortunately at home is typically okay.  Elevated BP related to NSAIDs?Marland Kitchen Plan: Continue Hyzaar, potassium, Terazosin, check BMP Osteoporosis: Due for a DEXA, will order on RTC, on chart review he was able to take Fosamax in 2013 and 2014 but never did so consistently. RTC scheduled for 01-2020 CPX

## 2019-12-10 ENCOUNTER — Ambulatory Visit: Payer: Self-pay

## 2019-12-10 ENCOUNTER — Ambulatory Visit (INDEPENDENT_AMBULATORY_CARE_PROVIDER_SITE_OTHER): Payer: Medicare HMO | Admitting: Family Medicine

## 2019-12-10 ENCOUNTER — Ambulatory Visit (INDEPENDENT_AMBULATORY_CARE_PROVIDER_SITE_OTHER): Payer: Medicare HMO

## 2019-12-10 ENCOUNTER — Encounter: Payer: Self-pay | Admitting: Family Medicine

## 2019-12-10 ENCOUNTER — Other Ambulatory Visit: Payer: Self-pay

## 2019-12-10 VITALS — BP 128/66 | HR 67 | Ht 67.0 in | Wt 199.8 lb

## 2019-12-10 DIAGNOSIS — M25512 Pain in left shoulder: Secondary | ICD-10-CM

## 2019-12-10 DIAGNOSIS — M25511 Pain in right shoulder: Secondary | ICD-10-CM | POA: Diagnosis not present

## 2019-12-10 DIAGNOSIS — M7522 Bicipital tendinitis, left shoulder: Secondary | ICD-10-CM

## 2019-12-10 DIAGNOSIS — M19012 Primary osteoarthritis, left shoulder: Secondary | ICD-10-CM | POA: Insufficient documentation

## 2019-12-10 NOTE — Progress Notes (Signed)
Subjective:    CC: L shoulder pain  I, Molly Weber, LAT, ATC, am serving as scribe for Dr. Clementeen Graham.  HPI: Pt is an 86 y/o male presenting w/ c/o L shoulder pain x approximately 3.5 months .  He locates his pain to his L superior shoulder.  He rates his pain as moderate.  He has pain with abduction and crossover arm compression.  Pain at bedtime and with activity.  Pain worsened recently any trouble sleeping last night.  No radiating pain weakness or numbness distally.  No injury history.  Radiating pain: yes into post upper arm and into L ant chest L shoulder mechanical symptoms: NO UE weakness: no Aggravating factors: pain is worse at night; horizontal aDd Treatments tried: Meloxicam, Tylenol, Advil   Pertinent review of Systems: No fevers or chills  Relevant historical information: Diabetes   Objective:    Vitals:   12/10/19 1021  BP: 128/66  Pulse: 67  SpO2: 93%   General: Well Developed, well nourished, and in no acute distress.   MSK: Left shoulder normal-appearing no significant swelling or erythema. Tender palpation AC joint mildly tender palpation bicipital groove. Shoulder motion normal external rotation. Abduction limited to 120 degrees. Internal rotation limited to iliac crest. Strength 4/5 abduction.  4+/5 external rotation.  5/5 internal rotation. Positive empty can test.  Mildly positive Hawkins and Neer's test. Positive crossover arm compression test. Mildly positive Yergason's and speeds test.  Right shoulder normal-appearing nontender. Motion also reduced abduction 130 degrees internal rotation lumbar spine normal external rotation. Strength intact.  Negative impingement or biceps tendinitis testing.    Lab and Radiology Results  X-ray images left shoulder obtained today personally and independently reviewed Mild glenohumeral DJD.  Significant AC DJD.  No acute fractures. Await formal radiology review  Diagnostic Limited MSK  Ultrasound of: Left shoulder Biceps tendon is intact however tendon sheath surrounded by hypoechoic fluid indicating tenosynovitis. Subscapularis tendon intact however footplate is somewhat irregular. Supraspinatus tendon with evidence of old healed tear.  Linear fibers are not congruent.  They are somewhat whirled but are connected from the proximal to distal end.  Slight hypoechoic fluid tracking superficial to distal portion of tendon. Subacromial bursa normal-appearing Infraspinatus normal-appearing AC joint very degenerative with effusion tender to palpation with ultrasound probe. Impression: Biceps tendinitis, old healed supraspinatus tendon tear.  AC joint DJD  Procedure: Real-time Ultrasound Guided Injection of left shoulder AC joint Device: Philips Affiniti 50G Images permanently stored and available for review in the ultrasound unit. Verbal informed consent obtained.  Discussed risks and benefits of procedure. Warned about infection bleeding damage to structures skin hypopigmentation and fat atrophy among others. Patient expresses understanding and agreement Time-out conducted.   Noted no overlying erythema, induration, or other signs of local infection.   Skin prepped in a sterile fashion.   Local anesthesia: Topical Ethyl chloride.   With sterile technique and under real time ultrasound guidance:  40 mg of Kenalog and 1 mL of Marcaine injected easily.   Completed without difficulty   Pain immediately resolved suggesting accurate placement of the medication.   Advised to call if fevers/chills, erythema, induration, drainage, or persistent bleeding.   Images permanently stored and available for review in the ultrasound unit.  Impression: Technically successful ultrasound guided injection.       Impression and Recommendations:    Assessment and Plan: 86 y.o. male with left shoulder pain.  Ongoing for 3-1/2 months of worsening recently.  Shoulder pain multifactorial.  Main  cause of pain today is AC joint related.  Patient also has evidence of rotator cuff tendinopathy with old tear seen on ultrasound and biceps tendinitis.  However following AC joint injection patient had significant improvement in his pain.  Plan for physical therapy and Voltaren gel as well.  Recheck in 1 month.  X-ray over read pending.  PDMP not reviewed this encounter. Orders Placed This Encounter  Procedures  . [place] LIMITED JOINT SPACE STRUCTURES UP LEFT(NO LINKED CHARGES)    Order Specific Question:   Reason for Exam (SYMPTOM  OR DIAGNOSIS REQUIRED)    Answer:   L shoulder pain    Order Specific Question:   Preferred imaging location?    Answer:   [place]  . DG Shoulder Left    Standing Status:   Future    Number of Occurrences:   1    Standing Expiration Date:   02/08/2021    Order Specific Question:   Reason for Exam (SYMPTOM  OR DIAGNOSIS REQUIRED)    Answer:   eval left shoulder pain    Order Specific Question:   Preferred imaging location?    Answer:   [facility]    Order Specific Question:   Radiology Contrast Protocol - do NOT remove file path    Answer:   \\[email or web address]  . Ambulatory referral to Physical Therapy    Referral Priority:   Routine    Referral Type:   Physical Medicine    Referral Reason:   Specialty Services Required    Requested Specialty:   Physical Therapy   No orders of the defined types were placed in this encounter.   Discussed warning signs or symptoms. Please see discharge instructions. Patient expresses understanding.   The above documentation has been reviewed and is accurate and complete [name]

## 2019-12-10 NOTE — Patient Instructions (Addendum)
Thank you for coming in today. Get xray now.  Call or go to the ER if you develop a large red swollen joint with extreme pain or oozing puss.  Use voltaren gel over the counter.  Attend PT.   Recheck in 1 month.  Return or contact sooner if not doing well.

## 2019-12-11 ENCOUNTER — Other Ambulatory Visit: Payer: Self-pay | Admitting: Internal Medicine

## 2019-12-11 NOTE — Progress Notes (Signed)
X-ray shows arthritis.  However the arthritis is more mild.  Please let me know if not better after injection.

## 2019-12-16 DIAGNOSIS — Z7982 Long term (current) use of aspirin: Secondary | ICD-10-CM | POA: Diagnosis not present

## 2019-12-16 DIAGNOSIS — Z888 Allergy status to other drugs, medicaments and biological substances status: Secondary | ICD-10-CM | POA: Diagnosis not present

## 2019-12-16 DIAGNOSIS — Z79899 Other long term (current) drug therapy: Secondary | ICD-10-CM | POA: Diagnosis not present

## 2019-12-16 DIAGNOSIS — E785 Hyperlipidemia, unspecified: Secondary | ICD-10-CM | POA: Diagnosis not present

## 2019-12-16 DIAGNOSIS — I10 Essential (primary) hypertension: Secondary | ICD-10-CM | POA: Diagnosis not present

## 2019-12-16 DIAGNOSIS — L249 Irritant contact dermatitis, unspecified cause: Secondary | ICD-10-CM | POA: Diagnosis not present

## 2019-12-16 DIAGNOSIS — R21 Rash and other nonspecific skin eruption: Secondary | ICD-10-CM | POA: Diagnosis not present

## 2019-12-18 ENCOUNTER — Telehealth: Payer: Self-pay | Admitting: Internal Medicine

## 2019-12-18 NOTE — Progress Notes (Signed)
  Chronic Care Management   Outreach Note  12/18/2019 Name: MAXIMUS HOFFERT MRN: [id number] DOB: 05/08/1933  Referred by: Wanda Plump, MD Reason for referral : No chief complaint on file.   An unsuccessful telephone outreach was attempted today. The patient was referred to the pharmacist for assistance with care management and care coordination.   This note is not being shared with the patient for the following reason: To respect privacy (The patient or proxy has requested that the information not be shared).  Follow Up Plan:   Lynnae January Upstream Scheduler

## 2019-12-19 ENCOUNTER — Other Ambulatory Visit: Payer: Self-pay

## 2019-12-19 ENCOUNTER — Ambulatory Visit: Payer: Medicare HMO | Attending: Family Medicine | Admitting: Physical Therapy

## 2019-12-19 DIAGNOSIS — M6281 Muscle weakness (generalized): Secondary | ICD-10-CM | POA: Diagnosis not present

## 2019-12-19 DIAGNOSIS — R293 Abnormal posture: Secondary | ICD-10-CM | POA: Diagnosis not present

## 2019-12-19 DIAGNOSIS — M25512 Pain in left shoulder: Secondary | ICD-10-CM | POA: Diagnosis not present

## 2019-12-19 DIAGNOSIS — R29898 Other symptoms and signs involving the musculoskeletal system: Secondary | ICD-10-CM | POA: Diagnosis not present

## 2019-12-19 NOTE — Therapy (Signed)
[facility] [address]  [address] [place], [place], [address] Phone: [phone]   Fax:  [phone]  Physical Therapy Evaluation  Patient Details  Name: Maurice Colon MRN: [id number] Date of Birth: [birth year] Referring Provider (PT): Clementeen Graham, MD   Encounter Date: 12/19/2019  PT End of Session - 12/19/19 1014    Visit Number  1    Number of Visits  8    Date for PT Re-Evaluation  01/16/20    Authorization Type  [employer] (prior auth) & Medicaid    PT Start Time  1014    PT Stop Time  1104    PT Time Calculation (min)  50 min    Activity Tolerance  Patient tolerated treatment well    Behavior During Therapy  [facility] for tasks assessed/performed       Past Medical History:  Diagnosis Date  . DJD (degenerative joint disease)   . Hyperlipidemia    Hypertriglyceridemia  . Hypertension   . Hypogonadism, male   . Osteoporosis   . Prediabetes 07/16/2013  . Rosacea     Past Surgical History:  Procedure Laterality Date  . CATARACT EXTRACTION     Bilaterally  . INGUINAL HERNIA REPAIR      There were no vitals filed for this visit.   Subjective Assessment - 12/19/19 1018    Subjective  Pt reports he used to have a lot of pain in his L shoulder but better since injection by MD last Monday. Pain originated ~3 months ago w/o known MOI. Since injection he still notes mild intermittent pain/soreness but nearly "100% better".    Patient is accompained by:  Family member   dtr - Duane Boston (serving as interpreter per pt request)   Diagnostic tests  Shoulder x-ray 12/10/19 - Mild acromioclavicular and glenohumeral degenerative disease. [place] 12/10/19 - Biceps tendinitis, old healed supraspinatus tendon tear.  AC joint DJD    Patient Stated Goals  "pain to be completely gone and be able to do everything as normal"    Currently in Pain?  No/denies    Pain Score  0-No pain   2/10 at worst   Pain Location   Shoulder    Pain Orientation  Left    Pain Descriptors / Indicators  Discomfort   "very, very mild"   Pain Type  Chronic pain    Pain Radiating Towards  n/a    Pain Onset  Other (comment)   >3 mo   Pain Frequency  Intermittent    Aggravating Factors   reaching across body, resisted abduction/lifting away from body    Pain Relieving Factors  Tylenol    Effect of Pain on Daily Activities  denies any limitations         [facility] PT Assessment - 12/19/19 1014      Assessment   Medical Diagnosis  Acute L shoulder pain    Referring Provider (PT)  Clementeen Graham, MD    Onset Date/Surgical Date  --   3 months   Hand Dominance  Left    Next MD Visit  01/10/20    Prior Therapy  none      Precautions   Precautions  None      Balance Screen   Has the patient fallen in the past 6 months  No    Has the patient had a decrease in activity level because of a fear of falling?   No    Is  the patient reluctant to leave their home because of a fear of falling?   No      Home Social worker  Private residence    Living Arrangements  Spouse/significant other;Children;Other relatives    Available Help at Discharge  Family    Type of Home  House      Prior Function   Level of [place]  Retired    Leisure  keeps busy around [place]   Overall Cognitive Status  Within Functional Limits for tasks assessed      Observation/Other Assessments   Focus on Therapeutic Outcomes (FOTO)   Shoulder - 86% (16% limitation); Predicted 76% (24% limitation)      Posture/Postural Control   Posture/Postural Control  Postural limitations    Postural Limitations  Forward head;Rounded Shoulders      ROM / Strength   AROM / PROM / Strength  AROM;Strength      AROM   AROM Assessment Site  Shoulder    Right/Left Shoulder  Right;Left    Right Shoulder Flexion  136 Degrees    Right Shoulder ABduction  159 Degrees    Right Shoulder Internal Rotation  --    FIR to L3   Right Shoulder External Rotation  --   FER to C7   Left Shoulder Flexion  130 Degrees    Left Shoulder ABduction  158 Degrees    Left Shoulder Internal Rotation  --   FIR to L3   Left Shoulder External Rotation  --   FER to C7     Strength   Strength Assessment Site  Shoulder    Right/Left Shoulder  Right;Left    Right Shoulder Flexion  4+/5    Right Shoulder ABduction  4+/5    Right Shoulder Internal Rotation  4+/5    Right Shoulder External Rotation  4+/5    Left Shoulder Flexion  4/5    Left Shoulder ABduction  4/5   mild pain   Left Shoulder Internal Rotation  4+/5    Left Shoulder External Rotation  4/5      Palpation   Palpation comment  denies ttp t/o L shoulder complex but mild increased muscle tension in L UT/LS & posterior capsule                  Objective measurements completed on examination: See above findings.      [facility] Adult PT Treatment/Exercise - 12/19/19 1014      Exercises   Exercises  Shoulder      Shoulder Exercises: Standing   Extension  Both;10 reps;Strengthening;Theraband    Theraband Level (Shoulder Extension)  Level 2 (Red)    Extension Limitations  cues for scap retraction & depression    Row  Both;10 reps;Strengthening;Theraband    Theraband Level (Shoulder Row)  Level 2 (Red)    Row Limitations  cues for scap retraction & depression, keeping elbows tucked into ribs and forearms parallel to ground      Shoulder Exercises: IT sales professional  30 seconds;1 rep    Corner Stretch Limitations  low doorway stretch             PT Education - 12/19/19 1102    Education Details  PT eval findings, anticipated POC, initial HEP    Person(s) Educated  Patient;Child(ren)    Methods  Explanation;Demonstration;Verbal cues;Tactile cues;Handout    Comprehension  Verbalized understanding;Returned demonstration;Verbal  cues required;Tactile cues required;Need further instruction          PT Long Term Goals -  12/19/19 1104      PT LONG TERM GOAL #1   Title  Patient will be independent with ongoing/advanced HEP    Status  New    Target Date  01/16/20      PT LONG TERM GOAL #2   Title  Patient to demonstrate ability to achieve and maintain good shoulder alignment/posturing    Status  New    Target Date  01/16/20      PT LONG TERM GOAL #3   Title  Patient to improve L shoulder AROM to WNL without pain provocation    Status  New    Target Date  01/16/20      PT LONG TERM GOAL #4   Title  Patient will demonstrate improved L shoulder strength to >/= 4+/5 w/o pain provocation for functional UE use    Status  New    Target Date  01/16/20      PT LONG TERM GOAL #5   Title  Patient to report ability to perform ADLs, household and daily tasks without increased L shoulder pain    Status  New    Target Date  01/16/20             Plan - 12/19/19 1104    Clinical Impression Statement  Maurice Colon is an 86 y/o L hand dominant male who presents to OP PT for acute L shoulder pain of ~3 months duration w/o known MOI. Pain initially limiting all ROM of shoulder as well as functional use of L arm with lifting and carrying items. He received a cortisone injection at the MD office on 12/10/19 and notes pain nearly "100% better" with only intermittent mild discomfort with reaching across body or resisted abduction. L shoulder ROM nearly equivalent to R with mild L shoulder weakness evident and daughter noting "increased effort" when pt using L arm despite this being his dominant hand. He demonstrates a forward head and shoulder posture with slight increased muscle tension evident in L upper and posterior shoulder. Maurice Colon will benefit from skilled PT to address above deficits and restore functional ROM and strength with decreased pain to increase tolerance for ADLs and daily activities.    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, HLD, h/o back pain with radiculopathy    Examination-Activity Limitations  Reach Overhead;Bathing;Dressing;Lift;Carry   mostly resolved since injection   Examination-Participation Restrictions  Cleaning;Laundry;Meal Prep    Stability/Clinical Decision Making  Stable/Uncomplicated    Clinical Decision Making  Low    Rehab Potential  Good    PT Frequency  2x / week    PT Duration  4 weeks    PT Treatment/Interventions  ADLs/Self Care Home Management;Cryotherapy;Electrical Stimulation;Iontophoresis 4mg /ml Dexamethasone;Moist Heat;Ultrasound;Functional mobility training;Therapeutic activities;Therapeutic exercise;Neuromuscular re-education;Patient/family education;Manual techniques;Passive range of motion;Dry needling;Taping;Vasopneumatic Device;Joint Manipulations    PT Next Visit Plan  review initial HEP; postural stretching and strengthening; manual therapy to address tightness/increased muscle tension; modalities PRN    PT Home Exercise Plan  12/19/19 - low doorway pec stretch, red TB rows and scap retraction    Consulted and Agree with Plan of Care  Patient;Family member/caregiver    Family Member Consulted  dtr - 12/21/19 "Maurice Colon       Patient will benefit from skilled  therapeutic intervention in order to improve the following deficits and impairments:  Decreased knowledge of precautions, Decreased range of motion, Decreased strength, Increased muscle spasms, Impaired flexibility, Impaired perceived functional ability, Impaired UE functional use, Pain, Postural dysfunction, Improper body mechanics  Visit Diagnosis: Acute pain of left shoulder  Abnormal posture  Muscle weakness (generalized)  Other symptoms and signs involving the musculoskeletal system     Problem List Patient Active Problem List   Diagnosis Date Noted  . DJD of left AC (acromioclavicular)  joint 12/10/2019  . Wenckebach block 03/31/2019  . Elevated LFTs 09/16/2015  . PCP NOTES>>>>>>>>>>>>>>>>>>>>>>>>>>>>>>>>>>>>> 04/16/2015  . Diabetes mellitus, type II (HCC) 07/16/2013  . Back pain w/ radiculopathy 11/29/2012  . Annual physical exam 11/02/2011  . Rosacea 02/11/2011  . DJD (degenerative joint disease) 02/11/2011  . HYPOGONADISM, MALE 06/13/2008  . Osteoporosis 03/12/2008  . Hyperlipidemia 02/13/2007  . Essential hypertension 02/13/2007    Marry Guan, PT, MPT 12/19/2019, 1:02 PM  [facility] [address]  [address] [place], [place], [address] Phone: [phone]   Fax:  [phone]  Name: Maurice Colon MRN: [id number] Date of Birth: 11-07-1932

## 2019-12-19 NOTE — Patient Instructions (Signed)
    Home exercise program created by Jaaziel Peatross, PT.  For questions, please contact Ailen Strauch via phone at [phone] or email at [email or web address]  [facility] [place] [address]  [address] [place], [place], [address] Phone: [phone]   Fax:  [phone]    

## 2019-12-26 ENCOUNTER — Other Ambulatory Visit: Payer: Self-pay

## 2019-12-26 ENCOUNTER — Ambulatory Visit: Payer: Medicare HMO | Admitting: Physical Therapy

## 2019-12-26 ENCOUNTER — Encounter: Payer: Self-pay | Admitting: Physical Therapy

## 2019-12-26 DIAGNOSIS — R29898 Other symptoms and signs involving the musculoskeletal system: Secondary | ICD-10-CM | POA: Diagnosis not present

## 2019-12-26 DIAGNOSIS — M6281 Muscle weakness (generalized): Secondary | ICD-10-CM

## 2019-12-26 DIAGNOSIS — M25512 Pain in left shoulder: Secondary | ICD-10-CM | POA: Diagnosis not present

## 2019-12-26 DIAGNOSIS — R293 Abnormal posture: Secondary | ICD-10-CM

## 2019-12-26 NOTE — Therapy (Signed)
[facility] [address]  [address] [place], [place], [address] Phone: [phone]   Fax:  [phone]  Physical Therapy Treatment  Patient Details  Name: Maurice Colon MRN: [id number] Date of Birth: [birth year] Referring Provider (PT): Clementeen Graham, MD   Encounter Date: 12/26/2019  PT End of Session - 12/26/19 1019    Visit Number  2    Number of Visits  8    Date for PT Re-Evaluation  01/16/20    Authorization Type  [employer] Medicare (prior auth) & Medicaid    Authorization Time Period  12/19/19 - 01/16/20    Authorization - Visit Number  1    Authorization - Number of Visits  2    PT Start Time  1019    PT Stop Time  1103    PT Time Calculation (min)  44 min    Activity Tolerance  Patient tolerated treatment well    Behavior During Therapy  [facility] for tasks assessed/performed       Past Medical History:  Diagnosis Date  . DJD (degenerative joint disease)   . Hyperlipidemia    Hypertriglyceridemia  . Hypertension   . Hypogonadism, male   . Osteoporosis   . Prediabetes 07/16/2013  . Rosacea     Past Surgical History:  Procedure Laterality Date  . CATARACT EXTRACTION     Bilaterally  . INGUINAL HERNIA REPAIR      There were no vitals filed for this visit.  Subjective Assessment - 12/26/19 1025    Subjective  Pt denies pain today and reports no issues with initial HEP.    Patient is accompained by:  Family member;Interpreter   dtr - Duane Boston   Diagnostic tests  Shoulder x-ray 12/10/19 - Mild acromioclavicular and glenohumeral degenerative disease. [place] 12/10/19 - Biceps tendinitis, old healed supraspinatus tendon tear.  AC joint DJD    Patient Stated Goals  "pain to be completely gone and be able to do everything as normal"    Currently in Pain?  No/denies    Pain Onset  Other (comment)   >3 mo                       [facility] Adult PT Treatment/Exercise - 12/26/19 1019      Exercises    Exercises  Shoulder      Shoulder Exercises: Standing   Horizontal ABduction  Both;10 reps;Strengthening;Theraband    Theraband Level (Shoulder Horizontal ABduction)  Level 2 (Red)    Horizontal ABduction Limitations  cues for scap retraction avoiding shoulder shrug    External Rotation  Both;10 reps;Strengthening;Theraband    Theraband Level (Shoulder External Rotation)  Level 2 (Red)    External Rotation Limitations  cues for scap retraction, keeping elbows tucked against ribs    Extension  Both;10 reps;Strengthening;Theraband    Theraband Level (Shoulder Extension)  Level 2 (Red)    Extension Limitations  cues for scap retraction & depression    Row  Both;10 reps;Strengthening;Theraband    Theraband Level (Shoulder Row)  Level 2 (Red)    Row Limitations  cues for scap retraction & depression, keeping elbows tucked into ribs and forearms parallel to ground avoiding shoulder      Shoulder Exercises: ROM/Strengthening   UBE (Upper Arm Bike)  L1.5 x 6 min (3' fwd/3" back)      Shoulder Exercises: Lawyer  30 seconds;3 reps    Research officer, political party Limitations  3-way doorway pec stretch - pt noting best stretch with mid position    Cross Chest Stretch  30 seconds;2 reps    Cross Chest Stretch Limitations  L posterior capsule stretch    Other Shoulder Stretches  L open book stretch 10 x 5"      Manual Therapy   Manual Therapy  Soft tissue mobilization;Myofascial release;Scapular mobilization    Manual therapy comments  R side lying    Soft tissue mobilization  STM/DTM to L UT, LS, and posterior shoulder complex, esp teres group and lats    Myofascial Release  manual TPR to L UT, LS, teres group and lats    Scapular Mobilization  L scapula - all planes             PT Education - 12/26/19 1100    Education Details  HEP update - mid doorway pec stretch, posterior capsule stretch, B shoulder red TB ER    Person(s) Educated  Patient    Methods   Explanation;Demonstration;Handout    Comprehension  Verbalized understanding;Returned demonstration;Need further instruction          PT Long Term Goals - 12/26/19 1023      PT LONG TERM GOAL #1   Title  Patient will be independent with ongoing/advanced HEP    Status  On-going    Target Date  01/16/20      PT LONG TERM GOAL #2   Title  Patient to demonstrate ability to achieve and maintain good shoulder alignment/posturing    Status  On-going    Target Date  01/16/20      PT LONG TERM GOAL #3   Title  Patient to improve L shoulder AROM to WNL without pain provocation    Status  On-going    Target Date  01/16/20      PT LONG TERM GOAL #4   Title  Patient will demonstrate improved L shoulder strength to >/= 4+/5 w/o pain provocation for functional UE use    Status  On-going    Target Date  01/16/20      PT LONG TERM GOAL #5   Title  Patient to report ability to perform ADLs, household and daily tasks without increased L shoulder pain    Status  On-going    Target Date  01/16/20            Plan - 12/26/19 1024    Clinical Impression Statement  Orlandus any pain today and reports no issues with HEP. HEP review requiring correction of technique for doorway pec stretch and theraband rows. Attempted mid and upper doorway pec stretches with pt noting best stretch with mid position, therefore HEP updated accordingly. Cues necessary with red TB rows to avoid shoulder shrug/hike while emphasizing scapular retraction and depression. Pt reports greatest restriction currently with reaching cross body, therefore introduced posterior capsule stretch and targeted manual STM/MFR to reduce muscle tension/tightness/ttp in posterior shoulder as well as promote better scapular motion for improved GH kinematic with relevant HEP updates provided. Pt continues to deny pain and reported better motion by end of session.    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, HLD, h/o back pain with radiculopathy    Examination-Activity Limitations  Reach Overhead;Bathing;Dressing;Lift;Carry   mostly resolved since injection   Examination-Participation Restrictions  Cleaning;Laundry;Meal Prep    Rehab Potential  Good  PT Frequency  2x / week    PT Duration  4 weeks    PT Treatment/Interventions  ADLs/Self Care Home Management;Cryotherapy;Electrical Stimulation;Iontophoresis 4mg /ml Dexamethasone;Moist Heat;Ultrasound;Functional mobility training;Therapeutic activities;Therapeutic exercise;Neuromuscular re-education;Patient/family education;Manual techniques;Passive range of motion;Dry needling;Taping;Vasopneumatic Device;Joint Manipulations    PT Next Visit Plan  review initial HEP; postural stretching and strengthening; manual therapy to address tightness/increased muscle tension; modalities PRN    PT Home Exercise Plan  12/19/19 - low doorway pec stretch, red TB rows and scap retraction    Consulted and Agree with Plan of Care  Patient;Family member/caregiver    Family Member Consulted  dtr - 12/21/19 "Regan Rakers       Patient will benefit from skilled therapeutic intervention in order to improve the following deficits and impairments:  Decreased knowledge of precautions, Decreased range of motion, Decreased strength, Increased muscle spasms, Impaired flexibility, Impaired perceived functional ability, Impaired UE functional use, Pain, Postural dysfunction, Improper body mechanics  Visit Diagnosis: Acute pain of left shoulder  Abnormal posture  Muscle weakness (generalized)  Other symptoms and signs involving the musculoskeletal system     Problem List Patient Active Problem List   Diagnosis Date Noted  . DJD of left AC (acromioclavicular) joint 12/10/2019  . Wenckebach block 03/31/2019  . Elevated LFTs  09/16/2015  . PCP NOTES>>>>>>>>>>>>>>>>>>>>>>>>>>>>>>>>>>>>> 04/16/2015  . Diabetes mellitus, type II (HCC) 07/16/2013  . Back pain w/ radiculopathy 11/29/2012  . Annual physical exam 11/02/2011  . Rosacea 02/11/2011  . DJD (degenerative joint disease) 02/11/2011  . HYPOGONADISM, MALE 06/13/2008  . Osteoporosis 03/12/2008  . Hyperlipidemia 02/13/2007  . Essential hypertension 02/13/2007    02/15/2007, PT, MPT 12/26/2019, 1:34 PM  [facility] [address]  [address] [place], [place], [place] Phone: [phone]   Fax:  [phone]  Name: GARRY NICOLINI MRN: Matilde Bash Date of Birth: [birth year]

## 2019-12-26 NOTE — Patient Instructions (Signed)
    Home exercise program created by Stephanine Reas, PT.  For questions, please contact Oleva Koo via phone at [phone] or email at [email or web address]   [facility] [place] [address]  [address] [place], [place], [address] Phone: [phone]   Fax:  [phone]    

## 2019-12-28 ENCOUNTER — Other Ambulatory Visit: Payer: Self-pay

## 2019-12-28 ENCOUNTER — Ambulatory Visit: Payer: Medicare HMO

## 2019-12-28 ENCOUNTER — Encounter: Payer: Self-pay | Admitting: Internal Medicine

## 2019-12-28 ENCOUNTER — Ambulatory Visit (INDEPENDENT_AMBULATORY_CARE_PROVIDER_SITE_OTHER): Payer: Medicare HMO | Admitting: Internal Medicine

## 2019-12-28 VITALS — BP 126/70 | HR 80 | Temp 97.2°F | Resp 18 | Ht 67.0 in | Wt 198.0 lb

## 2019-12-28 DIAGNOSIS — M25512 Pain in left shoulder: Secondary | ICD-10-CM | POA: Diagnosis not present

## 2019-12-28 DIAGNOSIS — R29898 Other symptoms and signs involving the musculoskeletal system: Secondary | ICD-10-CM | POA: Diagnosis not present

## 2019-12-28 DIAGNOSIS — M25511 Pain in right shoulder: Secondary | ICD-10-CM | POA: Diagnosis not present

## 2019-12-28 DIAGNOSIS — M6281 Muscle weakness (generalized): Secondary | ICD-10-CM

## 2019-12-28 DIAGNOSIS — L249 Irritant contact dermatitis, unspecified cause: Secondary | ICD-10-CM | POA: Diagnosis not present

## 2019-12-28 DIAGNOSIS — R293 Abnormal posture: Secondary | ICD-10-CM | POA: Diagnosis not present

## 2019-12-28 MED ORDER — BETAMETHASONE DIPROPIONATE AUG 0.05 % EX CREA: TOPICAL_CREAM | Freq: Two times a day (BID) | CUTANEOUS | 0 refills | Status: DC

## 2019-12-28 NOTE — Progress Notes (Signed)
Subjective:    Patient ID: Maurice Colon, male    DOB: 12-18-1932, 86 y.o.   MRN: [id number]  DOS:  12/28/2019 Type of visit - description: ER follow-up Went to the ER on 12/16/2019, at that time he had a rash in both legs (L first) , + itchy OTCs did not help, symptoms were felt to be contact dermatitis, recommended a steroid cream and Benadryl.  Since then, redness has decreased but still itching to some extent.  Review of Systems Denies fever chills or new rashes. Shoulder pain improved.  Past Medical History:  Diagnosis Date  . DJD (degenerative joint disease)   . Hyperlipidemia    Hypertriglyceridemia  . Hypertension   . Hypogonadism, male   . Osteoporosis   . Prediabetes 07/16/2013  . Rosacea     Past Surgical History:  Procedure Laterality Date  . CATARACT EXTRACTION     Bilaterally  . INGUINAL HERNIA REPAIR      Allergies as of 12/28/2019      Reactions   Ace Inhibitors    REACTION: cough   Amlodipine Besylate    REACTION: edema   Metformin And Related Nausea Only   Intolerant, no allergic reaction, see OV 08-30-17      Medication List       Accurate as of Dec 28, 2019  1:48 PM. If you have any questions, ask your nurse or doctor.        aspirin 81 MG tablet Take 81 mg by mouth daily.   atorvastatin 10 MG tablet Commonly known as: LIPITOR Take 1 tablet (10 mg total) by mouth daily.   cholecalciferol 1000 units tablet Commonly known as: VITAMIN D Take 1,000 Units by mouth daily. Reported on 09/16/2015   losartan-hydrochlorothiazide 100-25 MG tablet Commonly known as: HYZAAR Take 1 tablet by mouth daily.   meloxicam 7.5 MG tablet Commonly known as: MOBIC Take 1-2 tablets (7.5-15 mg total) by mouth daily as needed for pain.   potassium chloride 10 MEQ tablet Commonly known as: KLOR-CON Take 1 tablet (10 mEq total) by mouth daily.   sildenafil 20 MG tablet Commonly known as: REVATIO Take 3-4 tablets (60-80 mg total) by mouth at  bedtime as needed.   terazosin 1 MG capsule Commonly known as: HYTRIN Take 1 capsule (1 mg total) by mouth at bedtime.          Objective:   Physical Exam BP 126/70 (BP Location: Left Arm, Patient Position: Sitting, Cuff Size: Large)   Pulse 80   Temp (!) 97.2 F (36.2 C) (Temporal)   Resp 18   Ht 5\' 7"  (1.702 m)   Wt 198 lb (89.8 kg)   SpO2 97%   BMI 31.01 kg/m  General:   Well developed, NAD, BMI noted. HEENT:  Normocephalic . Face symmetric, atraumatic Lower extremities: no pretibial edema bilaterally  Skin:  Skin at the pretibial and calf areas: Very subtle, slightly pink, macular rash.  No pustules,no blisters.  See picture Neurologic:  alert & oriented X3.  Speech normal, gait appropriate for age and unassisted Psych--  Cognition and judgment appear intact.  Cooperative with normal attention span and concentration.  Behavior appropriate. No anxious or depressed appearing.        Assessment      Assessment  Diabetes: tried metformin, see note 08-30-17, has s/e, ok to try again if needed (low dose) HTN: amlodipine: edema  Dyslipidemia,  high triglycerides Hypogonadism- was unable to afford HRT 2012, no sx  Osteoporosis:  T score -2.4  (05-2012). T score -2.3 (05-2016): Rx Ca, vitamin D Vitamin D deficiency DJD-- meloxicam prn Rosacea Increase LFTs -- First noted ~ 04-2015, no etoh/tylenol. changed simvastatin to Lipitor 04-2014 and Lipitor was d/c 2-17. We also d/c fenofibrates 09-2015 ; LFTs back to normal  01-2016  Fatty liver per [place] 07-18-2015 Hep B-C  Neg 07-2015;  ceruloplasmin, ferritin, iron and alpha-1 antitrypsin ---> (-) 09-2015 EKG 2020, Wenckebach  PLAN: Contact dermatitis?: Overall improving with topical steroids and OTC Benadryl. Will prescribe a stronger topical steroid, Diprolene twice a day for 5 days, continue Benadryl, call if not completely better in few days Left shoulder pain: See last visit, status post a local injection and doing  physical therapy, much improved.    This visit occurred during the SARS-CoV-2 public health emergency.  Safety protocols were in place, including screening questions prior to the visit, additional usage of staff PPE, and extensive cleaning of exam room while observing appropriate contact time as indicated for disinfecting solutions.

## 2019-12-28 NOTE — Patient Instructions (Signed)
Apply the cream twice a day for 5 days  Okay to take Benadryl as needed  If you are not back to normal in few days let me know

## 2019-12-28 NOTE — Therapy (Signed)
[facility] [address]  [address] [place], [place], [address] Phone: [phone]   Fax:  [phone]  Physical Therapy Treatment  Patient Details  Name: Maurice Colon MRN: [id number] Date of Birth: 08-31-1932 Referring Provider (PT): Clementeen Graham, MD   Encounter Date: 12/28/2019  PT End of Session - 12/28/19 1024    Visit Number  3    Number of Visits  8    Date for PT Re-Evaluation  01/16/20    Authorization Type  [employer] Medicare (prior auth) & Medicaid    Authorization Time Period  12/19/19 - 01/16/20    Authorization - Visit Number  2    Authorization - Number of Visits  2    PT Start Time  1017    PT Stop Time  1100    PT Time Calculation (min)  43 min    Activity Tolerance  Patient tolerated treatment well    Behavior During Therapy  [facility] for tasks assessed/performed       Past Medical History:  Diagnosis Date  . DJD (degenerative joint disease)   . Hyperlipidemia    Hypertriglyceridemia  . Hypertension   . Hypogonadism, male   . Osteoporosis   . Prediabetes 07/16/2013  . Rosacea     Past Surgical History:  Procedure Laterality Date  . CATARACT EXTRACTION     Bilaterally  . INGUINAL HERNIA REPAIR      There were no vitals filed for this visit.  Subjective Assessment - 12/28/19 1023    Subjective  Pt. reporting via interpreter he is performing HEP and feels better.    Patient is accompained by:  Interpreter    Diagnostic tests  Shoulder x-ray 12/10/19 - Mild acromioclavicular and glenohumeral degenerative disease. [place] 12/10/19 - Biceps tendinitis, old healed supraspinatus tendon tear.  AC joint DJD    Patient Stated Goals  "pain to be completely gone and be able to do everything as normal"    Currently in Pain?  Yes    Pain Score  2     Pain Location  Shoulder    Pain Orientation  Left    Pain Descriptors / Indicators  --   unable to describe   Pain Type  Chronic pain    Pain Onset  More  than a month ago    Pain Frequency  Intermittent    Aggravating Factors   reaching across body    Multiple Pain Sites  No                        [facility] Adult PT Treatment/Exercise - 12/28/19 0001      Shoulder Exercises: Standing   External Rotation  Both;10 reps;Strengthening;Theraband   cued to avoid excessive scapular elevation    Theraband Level (Shoulder External Rotation)  Level 2 (Red)    External Rotation Limitations  on doorseal     Row  Both;15 reps;Theraband;Strengthening    Theraband Level (Shoulder Row)  Level 2 (Red)    Row Limitations  Heavy cueing for proper elbow positioning and to reduce excessive UT involement       Shoulder Exercises: ROM/Strengthening   UBE (Upper Arm Bike)  L1.5 x 6 min (3' fwd/3" back)    Cybex Row  15 reps    Cybex Row Limitations  10# - low handles       Shoulder Exercises: Lawyer  30 seconds;3 reps   Heavy  cueing for proper stretch position   Corner Stretch Limitations  3-way doorway pec stretch - pt noting best stretch with mid position    Cross Chest Stretch  30 seconds;2 reps    Cross Chest Stretch Limitations  L posterior capsule stretch 2 x 30 sec     Other Shoulder Stretches  L UT, LS stretch x 30 sec    Heavy cueing for proper positioning                  PT Long Term Goals - 12/26/19 1023      PT LONG TERM GOAL #1   Title  Patient will be independent with ongoing/advanced HEP    Status  On-going    Target Date  01/16/20      PT LONG TERM GOAL #2   Title  Patient to demonstrate ability to achieve and maintain good shoulder alignment/posturing    Status  On-going    Target Date  01/16/20      PT LONG TERM GOAL #3   Title  Patient to improve L shoulder AROM to WNL without pain provocation    Status  On-going    Target Date  01/16/20      PT LONG TERM GOAL #4   Title  Patient will demonstrate improved L shoulder strength to >/= 4+/5 w/o pain provocation for functional UE use     Status  On-going    Target Date  01/16/20      PT LONG TERM GOAL #5   Title  Patient to report ability to perform ADLs, household and daily tasks without increased L shoulder pain    Status  On-going    Target Date  01/16/20            Plan - 12/28/19 1025    Clinical Impression Statement  Thi doing well today noting via interpreter improving shoulder pain levels since starting therapy.  Did require heavy cueing with postural/periscapular strengthening activities for proper positioning and scapular mechanics.  Most time spent today instructing patient in proper chest stretching positioning as pt. requiring multiple demonstration and instruction via interpreter for understanding.  Good carryover demonstrating following instruction for updated HEP.  Ended visit pain free thus modalities deferred.    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, HLD, h/o back pain with radiculopathy    Rehab Potential  Good    PT Treatment/Interventions  ADLs/Self Care Home Management;Cryotherapy;Electrical Stimulation;Iontophoresis 4mg /ml Dexamethasone;Moist Heat;Ultrasound;Functional mobility training;Therapeutic activities;Therapeutic exercise;Neuromuscular re-education;Patient/family education;Manual techniques;Passive range of motion;Dry needling;Taping;Vasopneumatic Device;Joint Manipulations    PT Next Visit Plan  Postural stretching and strengthening; manual therapy to address tightness/increased muscle tension; modalities PRN    PT Home Exercise Plan  12/19/19 - low doorway pec stretch, red TB rows and scap retraction    Consulted and Agree with Plan of Care  Patient;Family member/caregiver       Patient will benefit from skilled therapeutic intervention in order to improve the following deficits and impairments:     Visit Diagnosis: Acute pain of left shoulder  Abnormal posture  Muscle weakness (generalized)  Other symptoms and signs involving the  musculoskeletal system     Problem List Patient Active Problem List   Diagnosis Date Noted  . DJD of left AC (acromioclavicular) joint 12/10/2019  . Wenckebach block 03/31/2019  . Elevated LFTs 09/16/2015  . PCP NOTES>>>>>>>>>>>>>>>>>>>>>>>>>>>>>>>>>>>>> 04/16/2015  . Diabetes mellitus, type II (HCC) 07/16/2013  . Back pain w/ radiculopathy 11/29/2012  .  Annual physical exam 11/02/2011  . Rosacea 02/11/2011  . DJD (degenerative joint disease) 02/11/2011  . HYPOGONADISM, MALE 06/13/2008  . Osteoporosis 03/12/2008  . Hyperlipidemia 02/13/2007  . Essential hypertension 02/13/2007    Bess Harvest, PTA 12/28/19 11:58 AM   [facility] [address]  [place], [place], [address] Phone: [phone]   Fax:  [phone]  Name: Maurice Colon MRN: [id number] Date of Birth: 1933/05/03

## 2019-12-30 NOTE — Assessment & Plan Note (Signed)
Contact dermatitis?: Overall improving with topical steroids and OTC Benadryl. Will prescribe a stronger topical steroid, Diprolene twice a day for 5 days, continue Benadryl, call if not completely better in few days Left shoulder pain: See last visit, status post a local injection and doing physical therapy, much improved.

## 2020-01-02 ENCOUNTER — Other Ambulatory Visit: Payer: Self-pay

## 2020-01-02 ENCOUNTER — Encounter: Payer: Self-pay | Admitting: Physical Therapy

## 2020-01-02 ENCOUNTER — Ambulatory Visit: Payer: Medicare HMO | Attending: Family Medicine | Admitting: Physical Therapy

## 2020-01-02 DIAGNOSIS — R293 Abnormal posture: Secondary | ICD-10-CM | POA: Diagnosis not present

## 2020-01-02 DIAGNOSIS — M25512 Pain in left shoulder: Secondary | ICD-10-CM

## 2020-01-02 DIAGNOSIS — R29898 Other symptoms and signs involving the musculoskeletal system: Secondary | ICD-10-CM | POA: Diagnosis not present

## 2020-01-02 DIAGNOSIS — M6281 Muscle weakness (generalized): Secondary | ICD-10-CM | POA: Diagnosis not present

## 2020-01-02 NOTE — Therapy (Signed)
[facility] [address]  [place], [place], [address] Phone: [phone]   Fax:  [phone]  Physical Therapy Treatment  Patient Details  Name: Maurice Colon MRN: [id number] Date of Birth: 16-May-1933 Referring Provider (PT): [name], MD   Encounter Date: 01/02/2020  PT End of Session - 01/02/20 1019    Visit Number  4    Number of Visits  8    Date for PT Re-Evaluation  01/16/20    Authorization Type  [employer] Medicare (prior auth) & Medicaid    Authorization Time Period  12/28/19 - 01/16/20    Authorization - Visit Number  1    Authorization - Number of Visits  8    PT Start Time  1019    PT Stop Time  1100    PT Time Calculation (min)  41 min    Activity Tolerance  Patient tolerated treatment well    Behavior During Therapy  [facility] for tasks assessed/performed       Past Medical History:  Diagnosis Date  . DJD (degenerative joint disease)   . Hyperlipidemia    Hypertriglyceridemia  . Hypertension   . Hypogonadism, male   . Osteoporosis   . Prediabetes 07/16/2013  . Rosacea     Past Surgical History:  Procedure Laterality Date  . CATARACT EXTRACTION     Bilaterally  . INGUINAL HERNIA REPAIR      There were no vitals filed for this visit.  Subjective Assessment - 01/02/20 1023    Subjective  Pt reports shoulder "is getting better and better".    Patient is accompained by:  Family member    Diagnostic tests  Shoulder x-ray 12/10/19 - Mild acromioclavicular and glenohumeral degenerative disease. [place] 12/10/19 - Biceps tendinitis, old healed supraspinatus tendon tear.  AC joint DJD    Patient Stated Goals  "pain to be completely gone and be able to do everything as normal"    Currently in Pain?  No/denies    Pain Onset  More than a month ago         [facility] PT Assessment - 01/02/20 1019      AROM   Right Shoulder Flexion  140 Degrees    Right Shoulder ABduction  145 Degrees    Left  Shoulder Flexion  134 Degrees    Left Shoulder ABduction  142 Degrees                    [facility] Adult PT Treatment/Exercise - 01/02/20 1019      Exercises   Exercises  Shoulder      Shoulder Exercises: Prone   Extension  Both;10 reps;Strengthening    Extension Limitations  I's leaning over orange Pball on mat table    Horizontal ABduction 1  Both;10 reps;Strengthening    Horizontal ABduction 1 Limitations  T's leaning over orange Pball on mat table      Shoulder Exercises: Standing   External Rotation  Both;15 reps;Strengthening;Theraband    Theraband Level (Shoulder External Rotation)  Level 2 (Red)    External Rotation Limitations  cues for scap retraction, keeping elbows tucked against ribs - scap retraction into doorframe    Extension  Both;15 reps;10 reps;Strengthening;Theraband    Theraband Level (Shoulder Extension)  Level 2 (Red);Level 3 (Green)   red x 15, green x 10   Row  Both;15 reps;10 reps;Strengthening;Theraband    Theraband Level (Shoulder Row)  Level 2 (Red);Level 3 (Green)  red x 15; green x10   Row Limitations  cues to avoid shoulder shrug or upward rotation of elbows      Shoulder Exercises: Therapy Ball   Flexion  Both;10 reps    Flexion Limitations  orange Pball on wall + slight lean in for stretch at top of motion      Shoulder Exercises: ROM/Strengthening   UBE (Upper Arm Bike)  L2.0 x 6 min (3' fwd/3" back)    Lat Pull  15 reps    Lat Pull Limitations  15# - cues for scap retraction & depression    Wall Pushups  10 reps   2 sets   Wall Pushups Limitations  2nd set on orange Pball      Shoulder Exercises: Stretch   Corner Stretch  30 seconds;2 reps    Corner Stretch Limitations  low & mid doorway stretches    Cross Chest Stretch  30 seconds;1 rep    Cross Chest Stretch Limitations  L posterior capsule stretch 2 x 30 sec                   PT Long Term Goals - 01/02/20 1025      PT LONG TERM GOAL #1   Title  Patient will  be independent with ongoing/advanced HEP    Status  Partially Met    Target Date  01/16/20      PT LONG TERM GOAL #2   Title  Patient to demonstrate ability to achieve and maintain good shoulder alignment/posturing    Status  Partially Met    Target Date  01/16/20      PT LONG TERM GOAL #3   Title  Patient to improve L shoulder AROM to WNL without pain provocation    Status  Achieved   01/02/20     PT LONG TERM GOAL #4   Title  Patient will demonstrate improved L shoulder strength to >/= 4+/5 w/o pain provocation for functional UE use    Status  On-going    Target Date  01/16/20      PT LONG TERM GOAL #5   Title  Patient to report ability to perform ADLs, household and daily tasks without increased L shoulder pain    Status  On-going    Target Date  01/16/20            Plan - 01/02/20 1026    Clinical Impression Statement  [name] reports his shoulder is feeling "better and better". Notes mild pain in L upper arm earlier this morning (now resolved) which his daughter thinks may be due to loading/unloading and carrying 2 cases of water yesterday while shopping. His posture is improving with better scapular retraction and decreased rounding of shoulders. L shoulder AROM now essentially equivalent to R with no pain reported during ROM. He continues to require some cueing for proper alignment and technique with HEP stretches and exercises but denies pain with any exercises and able to tolerate progression of resistance with rows/retraction to green TB. Overall pt progressing well toward goals.    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, HLD, h/o back pain with radiculopathy    Examination-Activity Limitations  Reach Overhead;Bathing;Dressing;Lift;Carry    Examination-Participation Restrictions   Cleaning;Laundry;Meal Prep    Rehab Potential  Good    PT Frequency  2x / week    PT Duration  4 weeks    PT Treatment/Interventions  ADLs/Self Care Home Management;Cryotherapy;Electrical Stimulation;Iontophoresis 54m/ml Dexamethasone;Moist Heat;Ultrasound;Functional mobility training;Therapeutic activities;Therapeutic exercise;Neuromuscular re-education;Patient/family education;Manual techniques;Passive range of motion;Dry needling;Taping;Vasopneumatic Device;Joint Manipulations    PT Next Visit Plan  Postural stretching and strengthening; manual therapy to address tightness/increased muscle tension; modalities PRN    PT Home Exercise Plan  12/19/19 - low doorway pec stretch, red TB rows and scap retraction; 5/26 - mid doorway stretch,    Consulted and Agree with Plan of Care  Patient;Family member/caregiver       Patient will benefit from skilled therapeutic intervention in order to improve the following deficits and impairments:  Decreased knowledge of precautions, Decreased range of motion, Decreased strength, Increased muscle spasms, Impaired flexibility, Impaired perceived functional ability, Impaired UE functional use, Pain, Postural dysfunction, Improper body mechanics  Visit Diagnosis: Acute pain of left shoulder  Abnormal posture  Muscle weakness (generalized)  Other symptoms and signs involving the musculoskeletal system     Problem List Patient Active Problem List   Diagnosis Date Noted  . DJD of left AC (acromioclavicular) joint 12/10/2019  . Wenckebach block 03/31/2019  . Elevated LFTs 09/16/2015  . PCP NOTES>>>>>>>>>>>>>>>>>>>>>>>>>>>>>>>>>>>>> 04/16/2015  . [place] 07/16/2013  . Back pain w/ radiculopathy 11/29/2012  . Annual physical exam 11/02/2011  . Rosacea 02/11/2011  . DJD (degenerative joint disease) 02/11/2011  . HYPOGONADISM, MALE 06/13/2008  . Osteoporosis 03/12/2008  . Hyperlipidemia 02/13/2007  . Essential hypertension  02/13/2007    JPercival Spanish PT, MPT 01/02/2020, 2:24 PM  [facility] [place]dg[place] [address]Phone: [phone]  Fax:  [phone] Name: MDARTAGNAN BEAVERSMRN: [id number] of Birth: 911-28-1934

## 2020-01-04 ENCOUNTER — Ambulatory Visit: Payer: Medicare HMO | Admitting: Physical Therapy

## 2020-01-08 ENCOUNTER — Telehealth: Payer: Self-pay | Admitting: Internal Medicine

## 2020-01-08 NOTE — Progress Notes (Signed)
  Chronic Care Management   Note  01/08/2020 Name: Maurice Colon MRN: [id number] DOB: 1933-04-23  Maurice Colon is a 86 y.o. year old male who is a primary care patient of Paz, Nolon Rod, MD. I reached out to [name] by phone today in response to a referral sent by Maurice Colon's PCP, Wanda Plump, MD.   [name] was given information about Chronic Care Management services today including:  1. CCM service includes personalized support from designated clinical staff supervised by his physician, including individualized plan of care and coordination with other care providers 2. 24/7 contact phone numbers for assistance for urgent and routine care needs. 3. Service will only be billed when office clinical staff spend 20 minutes or more in a month to coordinate care. 4. Only one practitioner may furnish and bill the service in a calendar month. 5. The patient may stop CCM services at any time (effective at the end of the month) by phone call to the office staff.   Patient agreed to services and verbal consent obtained.   This note is not being shared with the patient for the following reason: To respect privacy (The patient or proxy has requested that the information not be shared).  Follow up plan:   Lynnae January Upstream Scheduler

## 2020-01-09 ENCOUNTER — Ambulatory Visit: Payer: Medicare HMO | Admitting: Physical Therapy

## 2020-01-10 ENCOUNTER — Encounter: Payer: Self-pay | Admitting: Family Medicine

## 2020-01-10 ENCOUNTER — Ambulatory Visit (INDEPENDENT_AMBULATORY_CARE_PROVIDER_SITE_OTHER): Payer: Medicare HMO | Admitting: Family Medicine

## 2020-01-10 ENCOUNTER — Other Ambulatory Visit: Payer: Self-pay

## 2020-01-10 VITALS — BP 142/80 | HR 73 | Ht 67.0 in | Wt 195.0 lb

## 2020-01-10 DIAGNOSIS — M25512 Pain in left shoulder: Secondary | ICD-10-CM

## 2020-01-10 NOTE — Patient Instructions (Signed)
Thank you for coming in today. Plan to continue current treatment.  Recheck with me for this or other issues if needed.

## 2020-01-10 NOTE — Progress Notes (Signed)
   Wynema Birch, am serving as a Neurosurgeon for Dr. Clementeen Graham.  Maurice Colon is a 86 y.o. male who presents to [facility] today for follow up of L shoulder pain. Patient was last seen by Dr. Denyse Amass on 12/10/2019 where patient reported  pain to his L superior shoulder.  He rates his pain as moderate.  He has pain with abduction and crossover arm compression.  Pain at bedtime and with activity.  Pain worsened recently any trouble sleeping last night.  No radiating pain weakness or numbness distally.  No injury history. Patient was referred to PT and given voltaren gel. Since last visit patient reports States is better has been to PT and is helping a lot, not using the voltaren gel as the PT has been helping.    Pertinent review of systems: No fevers or chills  Relevant historical information: Diabetes   Exam:  BP (!) 142/80 (BP Location: Left Arm, Patient Position: Sitting, Cuff Size: Normal)   Pulse 73   Ht 5\' 7"  (1.702 m)   Wt 195 lb (88.5 kg)   SpO2 95%   BMI 30.54 kg/m  General: Well Developed, well nourished, and in no acute distress.   MSK: Left shoulder normal-appearing normal motion nontender normal strength negative impingement testing     Assessment and Plan: 86 y.o. male with significant improvement in left shoulder pain with Voltaren gel home exercise program and now physical therapy.  Plan to continue current treatment and recheck back with me as needed.     Discussed warning signs or symptoms. Please see discharge instructions. Patient expresses understanding.   The above documentation has been reviewed and is accurate and complete 97, M.D.

## 2020-01-11 ENCOUNTER — Ambulatory Visit: Payer: Medicare HMO

## 2020-01-16 ENCOUNTER — Other Ambulatory Visit: Payer: Self-pay

## 2020-01-16 ENCOUNTER — Encounter: Payer: Self-pay | Admitting: Physical Therapy

## 2020-01-16 ENCOUNTER — Ambulatory Visit: Payer: Medicare HMO | Admitting: Physical Therapy

## 2020-01-16 DIAGNOSIS — R293 Abnormal posture: Secondary | ICD-10-CM | POA: Diagnosis not present

## 2020-01-16 DIAGNOSIS — M25512 Pain in left shoulder: Secondary | ICD-10-CM

## 2020-01-16 DIAGNOSIS — R29898 Other symptoms and signs involving the musculoskeletal system: Secondary | ICD-10-CM | POA: Diagnosis not present

## 2020-01-16 DIAGNOSIS — M6281 Muscle weakness (generalized): Secondary | ICD-10-CM | POA: Diagnosis not present

## 2020-01-16 NOTE — Therapy (Addendum)
[facility] [address]  [place], [place], [address] Phone: [phone]   Fax:  [phone]  Physical Therapy Treatment / Discharge Summary  Patient Details  Name: Maurice Colon MRN: [id number] Date of Birth: Apr 20, 1933 Referring Provider (PT): [name], MD   Encounter Date: 01/16/2020   PT End of Session - 01/16/20 1011    Visit Number 5    Number of Visits 8    Date for PT Re-Evaluation 01/16/20    Authorization Type [employer] Medicare (prior auth) & Medicaid    Authorization Time Period 12/28/19 - 01/16/20    Authorization - Visit Number 2    Authorization - Number of Visits 8    PT Start Time 1011    PT Stop Time 1038    PT Time Calculation (min) 27 min    Activity Tolerance Patient tolerated treatment well    Behavior During Therapy [name] for tasks assessed/performed           Past Medical History:  Diagnosis Date  . DJD (degenerative joint disease)   . Hyperlipidemia    Hypertriglyceridemia  . Hypertension   . Hypogonadism, male   . Osteoporosis   . Prediabetes 07/16/2013  . Rosacea     Past Surgical History:  Procedure Laterality Date  . CATARACT EXTRACTION     Bilaterally  . INGUINAL HERNIA REPAIR      There were no vitals filed for this visit.   Subjective Assessment - 01/16/20 1014    Subjective Pt's dtr reporting MD did not feel need for another injection but will f/u PRN if pain persists.    Patient is accompained by: Family member   dtr   Diagnostic tests Shoulder x-ray 12/10/19 - Mild acromioclavicular and glenohumeral degenerative disease. [place] 12/10/19 - Biceps tendinitis, old healed supraspinatus tendon tear.  AC joint DJD    Patient Stated Goals "pain to be completely gone and be able to do everything as normal"    Currently in Pain? No/denies    Pain Onset More than a month ago              [facility] PT Assessment - 01/16/20 1011      Assessment   Medical Diagnosis  Acute L shoulder pain    Referring Provider (PT) [name], MD    Onset Date/Surgical Date --   3 months   Hand Dominance Left    Next MD Visit PRN      Observation/Other Assessments   Focus on Therapeutic Outcomes (FOTO)  Shoulder - 100% (0% limitation)      AROM   Right Shoulder Flexion 142 Degrees    Right Shoulder ABduction 145 Degrees    Right Shoulder Internal Rotation --   FIR to L3   Right Shoulder External Rotation --   FER to C7   Left Shoulder Flexion 135 Degrees    Left Shoulder ABduction 145 Degrees    Left Shoulder Internal Rotation --   FIR to L3   Left Shoulder External Rotation --   FER to C7     Strength   Right Shoulder Flexion 4+/5    Right Shoulder ABduction 4+/5    Right Shoulder Internal Rotation 4+/5    Right Shoulder External Rotation 4+/5    Left Shoulder Flexion 4+/5    Left Shoulder ABduction 4+/5    Left Shoulder Internal Rotation 4+/5    Left Shoulder External Rotation 4+/5  [facility] Adult PT Treatment/Exercise - 01/16/20 1011      Exercises   Exercises Shoulder      Shoulder Exercises: ROM/Strengthening   UBE (Upper Arm Bike) L2.0 x 6 min (3' fwd/3" back)                       PT Long Term Goals - 01/16/20 1017      PT LONG TERM GOAL #1   Title Patient will be independent with ongoing/advanced HEP    Status Achieved      PT LONG TERM GOAL #2   Title Patient to demonstrate ability to achieve and maintain good shoulder alignment/posturing    Status Achieved      PT LONG TERM GOAL #3   Title Patient to improve L shoulder AROM to WNL without pain provocation    Status Achieved   01/02/20     PT LONG TERM GOAL #4   Title Patient will demonstrate improved L shoulder strength to >/= 4+/5 w/o pain provocation for functional UE use    Status Achieved      PT LONG TERM GOAL #5   Title Patient to report ability to perform ADLs, household and daily tasks without increased L shoulder pain     Status Achieved                 Plan - 01/16/20 [place] is very pleased with his progress with PT. He denies any recent pain or limitation with daily activities requiring use of L shoulder. L shoulder AROM is WFL and essentially equivalent to R with B shoulder strength >/= 4+/5 without pain provocation. He reports good comfort and compliance with HEP and denies need for further review - discussed plan to continue with HEP ~3x/wk for additional 4-6 weeks to prevent recurrence of pain. All goals met and pt is ready to transition to HEP at this time, however pt would like to remain on hold for 30 days in the event that issues arise with HEP or pain returns.    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, HLD, h/o back pain with radiculopathy    Examination-Activity Limitations Reach Overhead;Bathing;Dressing;Lift;Carry    Examination-Participation Restrictions Cleaning;Laundry;Meal Prep    Rehab Potential Good    PT Treatment/Interventions ADLs/Self Care Home Management;Cryotherapy;Electrical Stimulation;Iontophoresis 66m/ml Dexamethasone;Moist Heat;Ultrasound;Functional mobility training;Therapeutic activities;Therapeutic exercise;Neuromuscular re-education;Patient/family education;Manual techniques;Passive range of motion;Dry needling;Taping;Vasopneumatic Device;Joint Manipulations    PT Next Visit Plan 30-day hold    PT Home Exercise Plan 12/19/19 - low doorway pec stretch, red TB rows and scap retraction; 5/26 - mid doorway stretch, posterior capsule stretch, red TB B shoulder ER    Consulted and Agree with Plan of Care Patient;Family member/caregiver    Family Member Consulted dtr - JElonda Husky"[name]          Patient will benefit from skilled therapeutic intervention in  order to improve the following deficits and impairments:  Decreased knowledge of precautions, Decreased range of motion, Decreased strength, Increased muscle spasms, Impaired flexibility, Impaired perceived functional ability, Impaired UE functional use, Pain, Postural dysfunction, Improper body mechanics  Visit Diagnosis: Acute pain of left shoulder  Abnormal posture  Muscle weakness (generalized)  Other symptoms and signs involving the musculoskeletal system     Problem List Patient Active Problem List   Diagnosis Date Noted  .  DJD of left AC (acromioclavicular) joint 12/10/2019  . Wenckebach block 03/31/2019  . Elevated LFTs 09/16/2015  . PCP NOTES>>>>>>>>>>>>>>>>>>>>>>>>>>>>>>>>>>>>> 04/16/2015  . Diabetes mellitus, type II ([place]) 07/16/2013  . Back pain w/ radiculopathy 11/29/2012  . Annual physical exam 11/02/2011  . Rosacea 02/11/2011  . DJD (degenerative joint disease) 02/11/2011  . HYPOGONADISM, MALE 06/13/2008  . Osteoporosis 03/12/2008  . Hyperlipidemia 02/13/2007  . Essential hypertension 02/13/2007    Percival Spanish, PT, MPT 01/16/2020, 10:46 AM  [facility] [address]  [place], [place], [address] Phone: [phone]   Fax:  [phone]  Name: Maurice Colon MRN: [id number] Date of Birth: 08-Dec-1932   PHYSICAL THERAPY DISCHARGE SUMMARY  Visits from Start of Care: 5  Current functional level related to goals / functional outcomes:   Refer to above clinical impression for status as of last visit on 01/16/2020. Patient was placed on hold for 30 days and has not needed to return to PT, therefore will proceed with discharge from PT for this episode.   Remaining deficits:   As above.    Education / Equipment:   HEP  Plan: Patient agrees to discharge.  Patient goals were met. Patient is being discharged due to meeting the stated rehab goals.  ?????     Percival Spanish, PT,  MPT 03/11/20, 9:38 AM  [facility] [place], [place], [address] Phone: [phone]   Fax:  [phone]

## 2020-02-12 IMAGING — DX DG CHEST 2V
2 series · 2 of 2 positions shown · non-contrast
Comparison: 07/27/2016.

CLINICAL DATA: Myalgias and chills for the past 3 days.

EXAM:
CHEST - 2 VIEW

[chest pa]
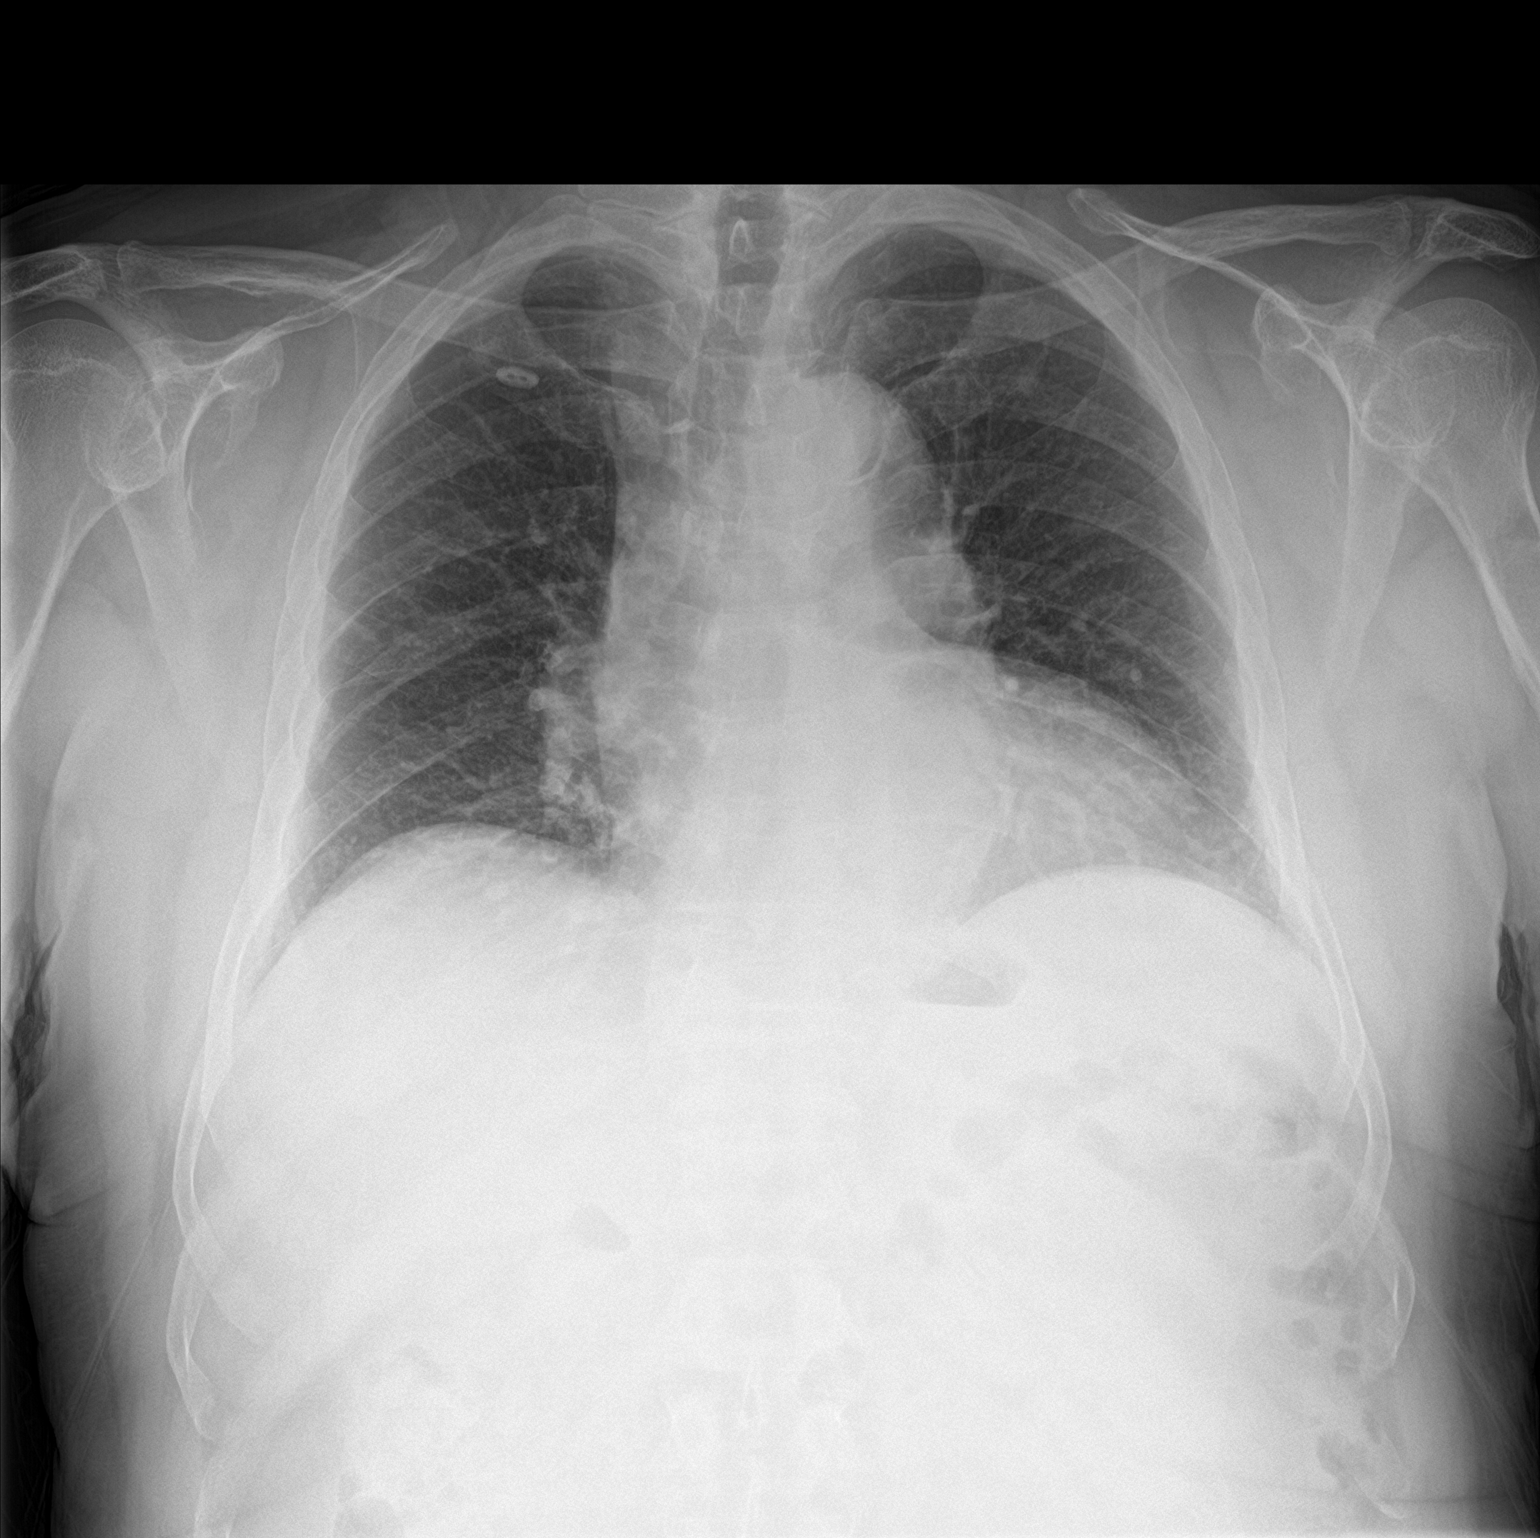

[chest lat]
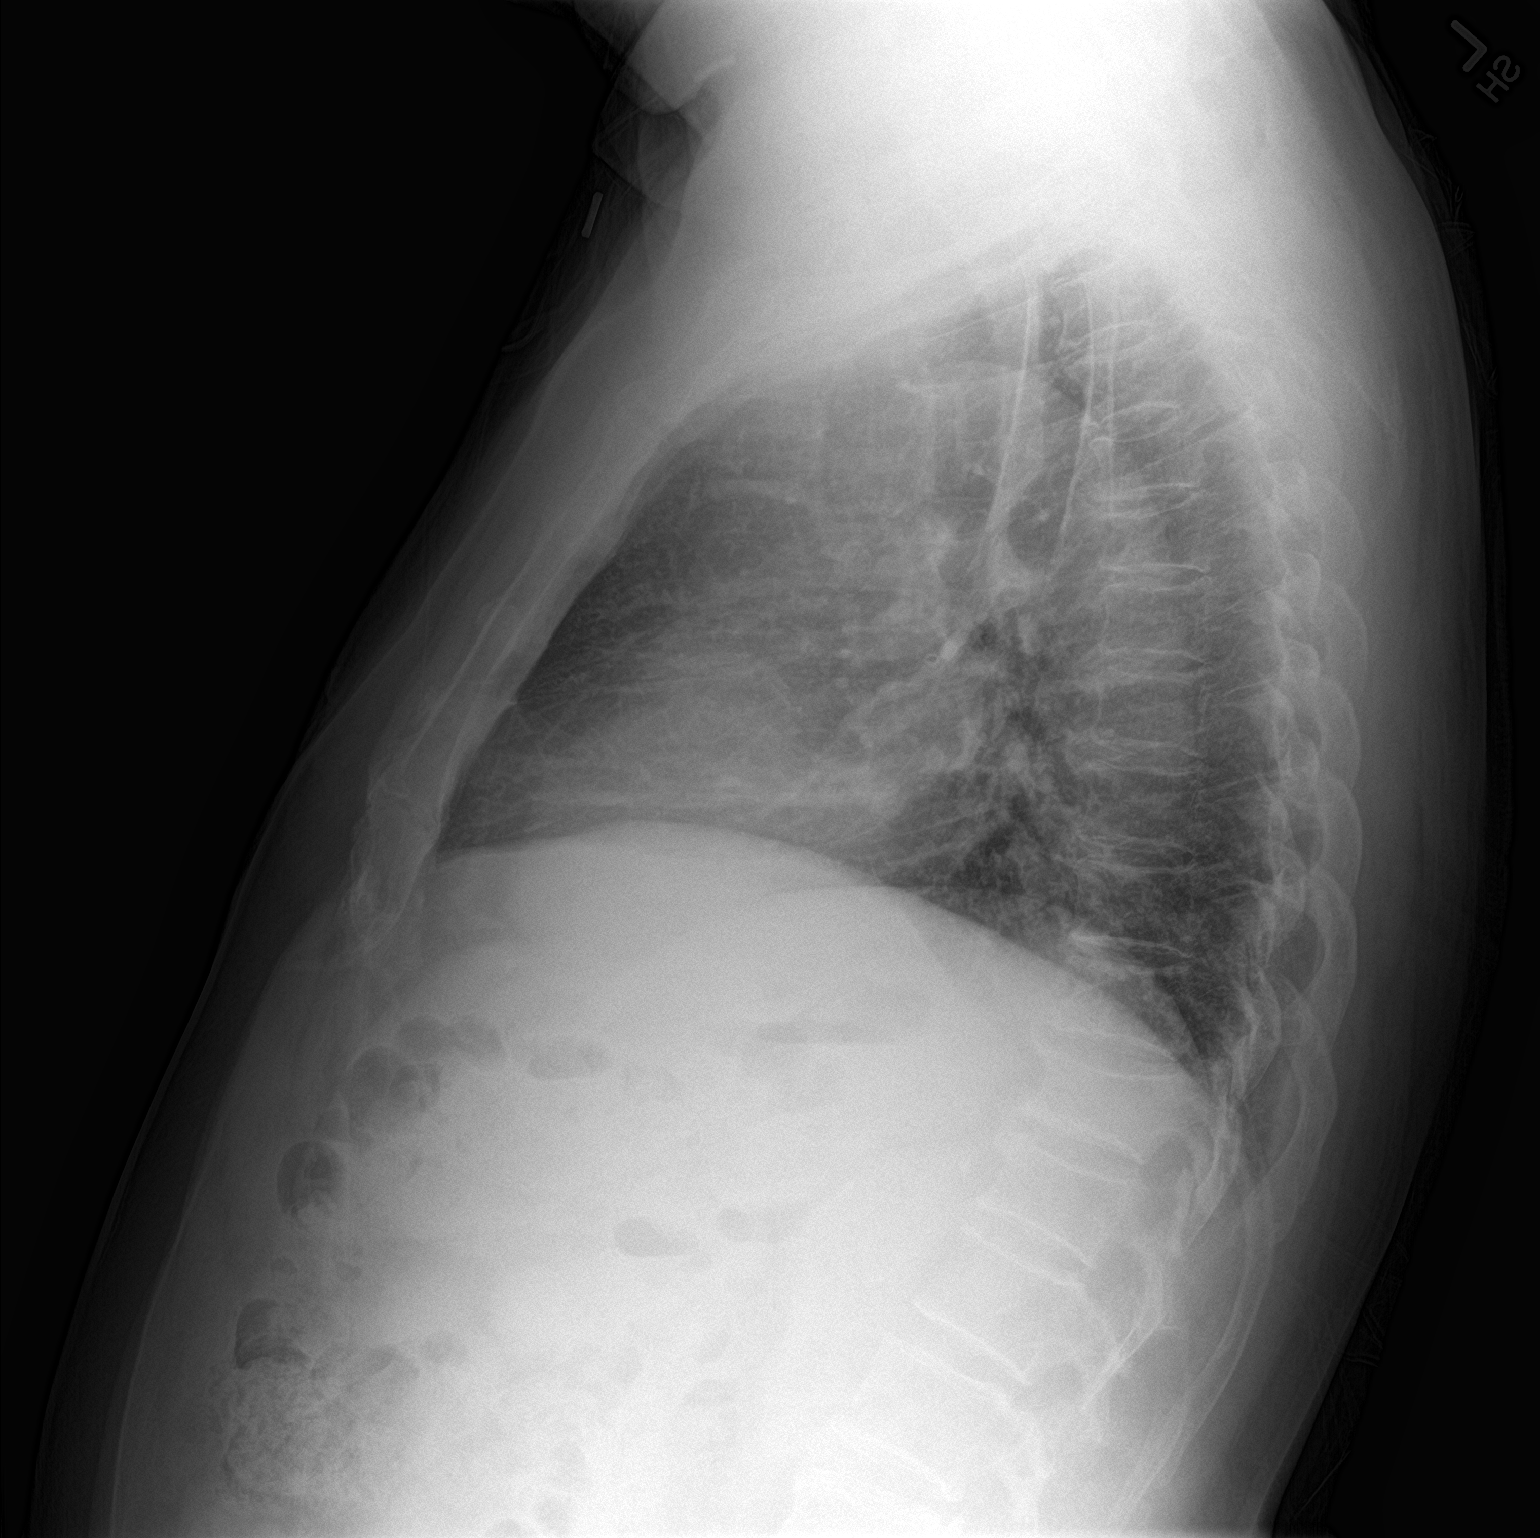

[2 of 2 positions shown; findings below may reference images not displayed]

FINDINGS: Poor inspiration. The cardiac silhouette remains borderline
enlarged. The aorta remains tortuous and partially calcified. Clear
lungs. Thoracic spine degenerative changes.
IMPRESSION: No acute abnormality.

## 2020-02-13 ENCOUNTER — Encounter: Payer: Self-pay | Admitting: Internal Medicine

## 2020-02-18 ENCOUNTER — Encounter: Payer: Self-pay | Admitting: Internal Medicine

## 2020-02-18 ENCOUNTER — Other Ambulatory Visit: Payer: Self-pay

## 2020-02-18 ENCOUNTER — Ambulatory Visit (INDEPENDENT_AMBULATORY_CARE_PROVIDER_SITE_OTHER): Payer: Medicare HMO | Admitting: Internal Medicine

## 2020-02-18 VITALS — BP 144/72 | HR 46 | Temp 97.8°F | Resp 18 | Ht 67.0 in | Wt 196.1 lb

## 2020-02-18 DIAGNOSIS — Z0000 Encounter for general adult medical examination without abnormal findings: Secondary | ICD-10-CM | POA: Diagnosis not present

## 2020-02-18 DIAGNOSIS — M810 Age-related osteoporosis without current pathological fracture: Secondary | ICD-10-CM

## 2020-02-18 DIAGNOSIS — E119 Type 2 diabetes mellitus without complications: Secondary | ICD-10-CM | POA: Diagnosis not present

## 2020-02-18 DIAGNOSIS — E785 Hyperlipidemia, unspecified: Secondary | ICD-10-CM

## 2020-02-18 LAB — CBC WITH DIFFERENTIAL/PLATELET
Basophils Absolute: 0.1 10*3/uL (ref 0.0–0.1)
Basophils Relative: 1 % (ref 0.0–3.0)
Eosinophils Absolute: 0.2 10*3/uL (ref 0.0–0.7)
Eosinophils Relative: 3.2 % (ref 0.0–5.0)
HCT: 40.7 % (ref 39.0–52.0)
Hemoglobin: 13.9 g/dL (ref 13.0–17.0)
Lymphocytes Relative: 26.2 % (ref 12.0–46.0)
Lymphs Abs: 1.4 10*3/uL (ref 0.7–4.0)
MCHC: 34.2 g/dL (ref 30.0–36.0)
MCV: 90.5 fl (ref 78.0–100.0)
Monocytes Absolute: 0.5 10*3/uL (ref 0.1–1.0)
Monocytes Relative: 9 % (ref 3.0–12.0)
Neutro Abs: 3.2 10*3/uL (ref 1.4–7.7)
Neutrophils Relative %: 60.6 % (ref 43.0–77.0)
Platelets: 228 10*3/uL (ref 150.0–400.0)
RBC: 4.5 Mil/uL (ref 4.22–5.81)
RDW: 13.7 % (ref 11.5–15.5)
WBC: 5.2 10*3/uL (ref 4.0–10.5)

## 2020-02-18 LAB — LIPID PANEL
Cholesterol: 128 mg/dL (ref 0–200)
HDL: 34.3 mg/dL — ABNORMAL LOW (ref >39.00)
LDL Cholesterol: 57 mg/dL (ref 0–99)
NonHDL: 94.02
Total CHOL/HDL Ratio: 4
Triglycerides: 185 mg/dL — ABNORMAL HIGH (ref 0.0–149.0)
VLDL: 37 mg/dL (ref 0.0–40.0)

## 2020-02-18 LAB — AST: AST: 26 U/L (ref 0–37)

## 2020-02-18 LAB — TSH: TSH: 2.78 u[IU]/mL (ref 0.35–4.50)

## 2020-02-18 LAB — HEMOGLOBIN A1C: Hgb A1c MFr Bld: 6 % (ref 4.6–6.5)

## 2020-02-18 LAB — ALT: ALT: 26 U/L (ref 0–53)

## 2020-02-18 MED ORDER — TERAZOSIN HCL 1 MG PO CAPS: 1.0000 mg | ORAL_CAPSULE | Freq: Every day | ORAL | 1 refills | Status: DC

## 2020-02-18 NOTE — Patient Instructions (Addendum)
Please schedule Medicare Wellness with Maurice Colon.   Per our records you are due for an eye exam. Please contact your eye doctor to schedule an appointment. Please have them send copies of your office visit notes to [place]. Our fax number is [phone].  Check the  blood pressure weekly BP GOAL is between 110/65 and  135/85. If it is consistently higher or lower, let me know   Consider getting the shingles vaccination called Shingrix  GO TO THE LAB : Get the blood work     GO TO THE FRONT DESK, PLEASE SCHEDULE YOUR APPOINTMENTS Come back for a checkup in 4 months  Stop by the first floor and schedule rebound density test

## 2020-02-18 NOTE — Progress Notes (Signed)
Subjective:    Patient ID: Maurice Colon, male    DOB: April 14, 1933, 86 y.o.   MRN: [id number]  DOS:  02/18/2020 Type of visit - description: cpx Feels well, no major concerns  BP Readings from Last 3 Encounters:  02/18/20 (!) 144/72  01/10/20 (!) 142/80  12/28/19 126/70    Review of Systems concerns. Specifically denies chest pain difficulty breathing, no LUTS No lower extremity paresthesias.  Other than above, a 14 point review of systems is negative     Past Medical History:  Diagnosis Date  . DJD (degenerative joint disease)   . Hyperlipidemia    Hypertriglyceridemia  . Hypertension   . Hypogonadism, male   . Osteoporosis   . Prediabetes 07/16/2013  . Rosacea     Past Surgical History:  Procedure Laterality Date  . CATARACT EXTRACTION     Bilaterally  . INGUINAL HERNIA REPAIR      Allergies as of 02/18/2020      Reactions   Ace Inhibitors    REACTION: cough   Amlodipine Besylate    REACTION: edema   Metformin And Related Nausea Only   Intolerant, no allergic reaction, see OV 08-30-17      Medication List       Accurate as of February 18, 2020 10:35 PM. If you have any questions, ask your nurse or doctor.        STOP taking these medications   augmented betamethasone dipropionate 0.05 % cream Commonly known as: DIPROLENE-AF Stopped by: Willow Ora, MD   sildenafil 20 MG tablet Commonly known as: REVATIO Stopped by: Willow Ora, MD     TAKE these medications   aspirin 81 MG tablet Take 81 mg by mouth daily.   atorvastatin 10 MG tablet Commonly known as: LIPITOR Take 1 tablet (10 mg total) by mouth daily.   cholecalciferol 1000 units tablet Commonly known as: VITAMIN D Take 1,000 Units by mouth daily. Reported on 09/16/2015   losartan-hydrochlorothiazide 100-25 MG tablet Commonly known as: HYZAAR Take 1 tablet by mouth daily.   meloxicam 7.5 MG tablet Commonly known as: MOBIC Take 1-2 tablets (7.5-15 mg total) by mouth daily as  needed for pain.   potassium chloride 10 MEQ tablet Commonly known as: KLOR-CON Take 1 tablet (10 mEq total) by mouth daily.   terazosin 1 MG capsule Commonly known as: HYTRIN Take 1 capsule (1 mg total) by mouth at bedtime.          Objective:   Physical Exam BP (!) 144/72 (BP Location: Left Arm, Patient Position: Sitting, Cuff Size: Small)   Pulse (!) 46   Temp 97.8 F (36.6 C) (Oral)   Resp 18   Ht 5\' 7"  (1.702 m)   Wt 196 lb 2 oz (89 kg)   SpO2 96%   BMI 30.72 kg/m  General: Well developed, NAD, BMI noted Neck: No  thyromegaly  HEENT:  Normocephalic . Face symmetric, atraumatic Lungs:  CTA B Normal respiratory effort, no intercostal retractions, no accessory muscle use. Heart: Bradycardic, no murmur.  Abdomen:  Not distended, soft, non-tender. No rebound or rigidity.   DM foot exam: No edema, good pedal pulses, pinprick examination normal Skin: Exposed areas without rash. Not pale. Not jaundice Neurologic:  alert & oriented X3.  Speech normal, gait appropriate for age and unassisted Strength symmetric and appropriate for age.  Psych: Cognition and judgment appear intact.  Cooperative with normal attention span and concentration.  Behavior appropriate. No anxious or depressed appearing.  Assessment     Assessment  Diabetes: tried metformin, see note 08-30-17, has s/e, ok to try again if needed (low dose) HTN: amlodipine: edema  Dyslipidemia,  high triglycerides Hypogonadism- was unable to afford HRT 2012, no sx  Osteoporosis: T score -2.4  (05-2012). T score -2.3 (05-2016): Rx Ca, vitamin D Vitamin D deficiency DJD-- meloxicam prn Rosacea Increase LFTs -- First noted ~ 04-2015, no etoh/tylenol. changed simvastatin to Lipitor 04-2014 and Lipitor was d/c 2-17. We also d/c fenofibrates 09-2015 ; LFTs back to normal  01-2016  Fatty liver per [place] 07-18-2015 Hep B-C  Neg 07-2015;  ceruloplasmin, ferritin, iron and alpha-1 antitrypsin ---> (-) 09-2015 EKG  2020, Wenckebach  PLAN: Here for CPX DM: Diet controlled, check A1c.  DM foot exam negative, no symptoms HTN: BP slightly elevated, no change for now, on Hyzaar, potassium, Hytrin.  Checking labs, recommend ambulatory BPs. High cholesterol, check a FLP, continue atorvastatin Osteoporosis: Checking a DEXA, last T score 2017   Vitamin D deficiency, on supplements, last labs okay. Wenckebach: Per EKG last year, bradycardic today, asymptomatic.  Recheck EKG on RTC RTC 4 months    This visit occurred during the SARS-CoV-2 public health emergency.  Safety protocols were in place, including screening questions prior to the visit, additional usage of staff PPE, and extensive cleaning of exam room while observing appropriate contact time as indicated for disinfecting solutions.

## 2020-02-18 NOTE — Assessment & Plan Note (Signed)
Here for CPX DM: Diet controlled, check A1c.  DM foot exam negative, no symptoms HTN: BP slightly elevated, no change for now, on Hyzaar, potassium, Hytrin.  Checking labs, recommend ambulatory BPs. High cholesterol, check a FLP, continue atorvastatin Osteoporosis: Checking a DEXA, last T score 2017   Vitamin D deficiency, on supplements, last labs okay. Wenckebach: Per EKG last year, bradycardic today, asymptomatic.  Recheck EKG on RTC RTC 4 months

## 2020-02-18 NOTE — Assessment & Plan Note (Signed)
-  Td: 2011 - PNM 23: 2008, 2019; prevnar  2016 - s/p zostavax, no documentation; shingrex encouraged - s/p covid vaccines  - rec flu shot q year -CCS: Colonoscopy 12-11,  (-), no further screening scopes - prostate ca screening: DRE PSA 09-2014 wnl, no further screening per guidelines, pt agrees  -Labs: FLP, AST, ALT, CBC, A1c, TSH.  - Diet ,exercise d/w pt

## 2020-02-18 NOTE — Progress Notes (Signed)
Pre visit review using our clinic review tool, if applicable. No additional management support is needed unless otherwise documented below in the visit note. 

## 2020-02-26 ENCOUNTER — Other Ambulatory Visit: Payer: Self-pay

## 2020-02-26 ENCOUNTER — Ambulatory Visit (HOSPITAL_BASED_OUTPATIENT_CLINIC_OR_DEPARTMENT_OTHER)
Admission: RE | Admit: 2020-02-26 | Discharge: 2020-02-26 | Disposition: A | Discharge status: Home / Self Care | Payer: Medicare HMO | Source: Ambulatory Visit | Attending: Internal Medicine | Admitting: Internal Medicine

## 2020-02-26 DIAGNOSIS — M85851 Other specified disorders of bone density and structure, right thigh: Secondary | ICD-10-CM | POA: Diagnosis not present

## 2020-02-26 DIAGNOSIS — M810 Age-related osteoporosis without current pathological fracture: Secondary | ICD-10-CM | POA: Diagnosis not present

## 2020-03-14 ENCOUNTER — Telehealth: Payer: Medicare HMO

## 2020-04-29 ENCOUNTER — Other Ambulatory Visit: Payer: Self-pay | Admitting: Internal Medicine

## 2020-04-29 DIAGNOSIS — E785 Hyperlipidemia, unspecified: Secondary | ICD-10-CM

## 2020-06-13 ENCOUNTER — Encounter: Payer: Self-pay | Admitting: Internal Medicine

## 2020-06-20 ENCOUNTER — Encounter: Payer: Self-pay | Admitting: Internal Medicine

## 2020-06-20 ENCOUNTER — Other Ambulatory Visit: Payer: Self-pay

## 2020-06-20 ENCOUNTER — Ambulatory Visit (INDEPENDENT_AMBULATORY_CARE_PROVIDER_SITE_OTHER): Payer: Medicare HMO | Admitting: Internal Medicine

## 2020-06-20 VITALS — BP 144/88 | HR 57 | Temp 97.6°F | Resp 18 | Ht 67.0 in | Wt 197.4 lb

## 2020-06-20 DIAGNOSIS — I10 Essential (primary) hypertension: Secondary | ICD-10-CM | POA: Diagnosis not present

## 2020-06-20 DIAGNOSIS — Z23 Encounter for immunization: Secondary | ICD-10-CM

## 2020-06-20 DIAGNOSIS — E119 Type 2 diabetes mellitus without complications: Secondary | ICD-10-CM

## 2020-06-20 LAB — BASIC METABOLIC PANEL
BUN: 23 mg/dL (ref 6–23)
CO2: 27 mEq/L (ref 19–32)
Calcium: 9.7 mg/dL (ref 8.4–10.5)
Chloride: 102 mEq/L (ref 96–112)
Creatinine, Ser: 1.04 mg/dL (ref 0.40–1.50)
GFR: 65.64 mL/min (ref >60.00)
Glucose, Bld: 90 mg/dL (ref 70–99)
Potassium: 3.7 mEq/L (ref 3.5–5.1)
Sodium: 137 mEq/L (ref 135–145)

## 2020-06-20 LAB — HEMOGLOBIN A1C: Hgb A1c MFr Bld: 5.9 % (ref 4.6–6.5)

## 2020-06-20 MED ORDER — BETAMETHASONE DIPROPIONATE AUG 0.05 % EX CREA: TOPICAL_CREAM | Freq: Two times a day (BID) | CUTANEOUS | 0 refills | Status: DC

## 2020-06-20 NOTE — Progress Notes (Signed)
Pre visit review using our clinic review tool, if applicable. No additional management support is needed unless otherwise documented below in the visit note. 

## 2020-06-20 NOTE — Patient Instructions (Addendum)
Happy Holidays!   Per our records you are due for an eye exam. Please contact your eye doctor to schedule an appointment. Please have them send copies of your office visit notes to [place]. Our fax number is [phone].   Check the  blood pressure 2 or 3 times a WEEK BP GOAL is between 110/65 and  135/85. If it is consistently higher or lower, let me know  Use the ointment as needed for few days. Do not use it long-term.  GO TO THE LAB : Get the blood work     GO TO THE FRONT DESK, PLEASE SCHEDULE YOUR APPOINTMENTS Come back for   a checkup in 6 months

## 2020-06-20 NOTE — Progress Notes (Signed)
Subjective:    Patient ID: Maurice Colon, male    DOB: [birth year], 86 y.o.   MRN: [id number]  DOS:  06/20/2020 Type of visit - description:f/u  Since the last office visit he is doing well, has no concerns. We talk about his ambulatory BPs. We talk about vaccinations. He remains active and take vitamin D regularly. For MSK issues take meloxicam rarely. Also has a rash on the right leg.  BP Readings from Last 3 Encounters:  06/20/20 (!) 166/91  02/18/20 (!) 144/72  01/10/20 (!) 142/80     Review of Systems See above   Past Medical History:  Diagnosis Date  . DJD (degenerative joint disease)   . Hyperlipidemia    Hypertriglyceridemia  . Hypertension   . Hypogonadism, male   . Osteoporosis   . Prediabetes 07/16/2013  . Rosacea     Past Surgical History:  Procedure Laterality Date  . CATARACT EXTRACTION     Bilaterally  . INGUINAL HERNIA REPAIR      Allergies as of 06/20/2020      Reactions   Ace Inhibitors    REACTION: cough   Amlodipine Besylate    REACTION: edema   Metformin And Related Nausea Only   Intolerant, no allergic reaction, see OV 08-30-17      Medication List       Accurate as of June 20, 2020 10:33 AM. If you have any questions, ask your nurse or doctor.        aspirin 81 MG tablet Take 81 mg by mouth daily.   atorvastatin 10 MG tablet Commonly known as: LIPITOR Take 1 tablet (10 mg total) by mouth daily.   cholecalciferol 1000 units tablet Commonly known as: VITAMIN D Take 1,000 Units by mouth daily. Reported on 09/16/2015   losartan-hydrochlorothiazide 100-25 MG tablet Commonly known as: HYZAAR Take 1 tablet by mouth daily.   meloxicam 7.5 MG tablet Commonly known as: MOBIC Take 1-2 tablets (7.5-15 mg total) by mouth daily as needed for pain.   potassium chloride 10 MEQ tablet Commonly known as: KLOR-CON Take 1 tablet (10 mEq total) by mouth daily.   terazosin 1 MG capsule Commonly known as: HYTRIN Take 1  capsule (1 mg total) by mouth at bedtime.          Objective:   Physical Exam Skin:        BP (!) 166/91 (BP Location: Left Arm, Patient Position: Sitting, Cuff Size: Normal)   Pulse (!) 57   Temp 97.6 F (36.4 C) (Oral)   Resp 18   Ht 5\' 7"  (1.702 m)   Wt 197 lb 6 oz (89.5 kg)   SpO2 99%   BMI 30.91 kg/m  General:   Well developed, NAD, BMI noted. HEENT:  Normocephalic . Face symmetric, atraumatic Lungs:  CTA B Normal respiratory effort, no intercostal retractions, no accessory muscle use. Heart: RRR,  no murmur.  Lower extremities: no pretibial edema bilaterally  Skin: Not pale. Not jaundice Neurologic:  alert & oriented X3.  Speech normal, gait appropriate for age and unassisted Psych--  Cognition and judgment appear intact.  Cooperative with normal attention span and concentration.  Behavior appropriate. No anxious or depressed appearing.      Assessment      Assessment  Diabetes: tried metformin, see note 08-30-17, has s/e, ok to try again if needed (low dose) HTN: amlodipine: edema  Dyslipidemia,  high triglycerides Hypogonadism- was unable to afford HRT 2012, no sx  Osteoporosis: T score -  2.4  (05-2012). T score -2.3 (05-2016): Rx Ca, vitamin D Vitamin D deficiency DJD-- meloxicam prn Rosacea Increase LFTs -- First noted ~ 04-2015, no etoh/tylenol. changed simvastatin to Lipitor 04-2014 and Lipitor was d/c 2-17. We also d/c fenofibrates 09-2015; LFTs back to normal  01-2016  Fatty liver per [place] 07-18-2015 Hep B-C  Neg 07-2015;  ceruloplasmin, ferritin, iron and alpha-1 antitrypsin ---> (-) 09-2015 EKG 2020, Wenckebach  PLAN: DM: Diet controlled, check A1c. HTN: On Hyzaar, potassium supplements, BP today slightly elevated: Recheck 144/88, at home consistently 130/80. Check a BMP, no change, continue monitoring. Osteoporosis: DEXA 01-2020, T score better at -1.9.  On vitamin D, last levels satisfactory.. Rash, right leg: Started 4 days ago, this is not  the first time, slightly pruritic, topical steroids help.  Request a refill: sent Preventive care: Had a Covid booster yesterday, feels well, proceed with flu shot. RTC 6 months    This visit occurred during the SARS-CoV-2 public health emergency.  Safety protocols were in place, including screening questions prior to the visit, additional usage of staff PPE, and extensive cleaning of exam room while observing appropriate contact time as indicated for disinfecting solutions.

## 2020-06-21 NOTE — Assessment & Plan Note (Signed)
DM: Diet controlled, check A1c. HTN: On Hyzaar, potassium supplements, BP today slightly elevated: Recheck 144/88, at home consistently 130/80. Check a BMP, no change, continue monitoring. Osteoporosis: DEXA 01-2020, T score better at -1.9.  On vitamin D, last levels satisfactory.. Rash, right leg: Started 4 days ago, this is not the first time, slightly pruritic, topical steroids help.  Request a refill: sent Preventive care: Had a Covid booster yesterday, feels well, proceed with flu shot. RTC 6 months

## 2020-07-07 DIAGNOSIS — H353131 Nonexudative age-related macular degeneration, bilateral, early dry stage: Secondary | ICD-10-CM | POA: Diagnosis not present

## 2020-07-07 DIAGNOSIS — H35373 Puckering of macula, bilateral: Secondary | ICD-10-CM | POA: Diagnosis not present

## 2020-07-07 LAB — HM DIABETES EYE EXAM

## 2020-07-09 ENCOUNTER — Other Ambulatory Visit: Payer: Self-pay | Admitting: Internal Medicine

## 2020-09-30 ENCOUNTER — Other Ambulatory Visit: Payer: Self-pay | Admitting: Internal Medicine

## 2020-09-30 DIAGNOSIS — E785 Hyperlipidemia, unspecified: Secondary | ICD-10-CM

## 2020-10-22 ENCOUNTER — Encounter: Payer: Self-pay | Admitting: Internal Medicine

## 2020-11-15 ENCOUNTER — Other Ambulatory Visit: Payer: Self-pay | Admitting: Internal Medicine

## 2020-11-26 ENCOUNTER — Encounter: Payer: Self-pay | Admitting: Internal Medicine

## 2020-12-04 ENCOUNTER — Ambulatory Visit: Payer: Medicare HMO | Attending: Internal Medicine

## 2020-12-04 ENCOUNTER — Ambulatory Visit (INDEPENDENT_AMBULATORY_CARE_PROVIDER_SITE_OTHER): Payer: Medicare HMO | Admitting: Internal Medicine

## 2020-12-04 ENCOUNTER — Other Ambulatory Visit: Payer: Self-pay

## 2020-12-04 ENCOUNTER — Encounter: Payer: Self-pay | Admitting: Internal Medicine

## 2020-12-04 VITALS — BP 158/76 | HR 60 | Temp 98.0°F | Resp 16 | Ht 67.0 in | Wt 199.2 lb

## 2020-12-04 DIAGNOSIS — Z09 Encounter for follow-up examination after completed treatment for conditions other than malignant neoplasm: Secondary | ICD-10-CM

## 2020-12-04 DIAGNOSIS — E785 Hyperlipidemia, unspecified: Secondary | ICD-10-CM

## 2020-12-04 DIAGNOSIS — I10 Essential (primary) hypertension: Secondary | ICD-10-CM | POA: Diagnosis not present

## 2020-12-04 DIAGNOSIS — Z23 Encounter for immunization: Secondary | ICD-10-CM

## 2020-12-04 LAB — BASIC METABOLIC PANEL
BUN: 19 mg/dL (ref 6–23)
CO2: 28 mEq/L (ref 19–32)
Calcium: 9.8 mg/dL (ref 8.4–10.5)
Chloride: 103 mEq/L (ref 96–112)
Creatinine, Ser: 1.05 mg/dL (ref 0.40–1.50)
GFR: 64.69 mL/min (ref >60.00)
Glucose, Bld: 107 mg/dL — ABNORMAL HIGH (ref 70–99)
Potassium: 3.9 mEq/L (ref 3.5–5.1)
Sodium: 138 mEq/L (ref 135–145)

## 2020-12-04 LAB — LIPID PANEL
Cholesterol: 146 mg/dL (ref 0–200)
HDL: 33.7 mg/dL — ABNORMAL LOW (ref >39.00)
NonHDL: 112.02
Total CHOL/HDL Ratio: 4
Triglycerides: 221 mg/dL — ABNORMAL HIGH (ref 0.0–149.0)
VLDL: 44.2 mg/dL — ABNORMAL HIGH (ref 0.0–40.0)

## 2020-12-04 LAB — LDL CHOLESTEROL, DIRECT: Direct LDL: 70 mg/dL

## 2020-12-04 MED ORDER — BETAMETHASONE DIPROPIONATE AUG 0.05 % EX CREA
TOPICAL_CREAM | Freq: Two times a day (BID) | CUTANEOUS | 3 refills | Status: DC | PRN
Start: 2020-12-04 — End: 2021-02-10

## 2020-12-04 NOTE — Patient Instructions (Signed)
Check the  blood pressure at least weekly BP GOAL is between 110/65 and  135/85. If it is consistently higher or lower, let me know     GO TO THE LAB : Get the blood work     GO TO THE FRONT DESK, PLEASE SCHEDULE YOUR APPOINTMENTS Come back for a physical exam in 4 months

## 2020-12-04 NOTE — Progress Notes (Signed)
   Covid-19 Vaccination Clinic  Name:  ABDULRAHMAN BRACEY    MRN: [id number] DOB: [birth year]  12/04/2020  Mr. Nieves-Rodriguez was observed post Covid-19 immunization for 15 minutes without incident. He was provided with Vaccine Information Sheet and instruction to access the V-Safe system.   Mr. Hing was instructed to call 911 with any severe reactions post vaccine: Marland Kitchen Difficulty breathing  . Swelling of face and throat  . A fast heartbeat  . A bad rash all over body  . Dizziness and weakness   Immunizations Administered    Name Date Dose VIS Date Route   PFIZER Comrnaty(Gray TOP) Covid-19 Vaccine 12/04/2020 11:37 AM 0.3 mL 07/10/2020 Intramuscular   Manufacturer: [employer], [employer]   Lot: [id number]   NDC: [phone]

## 2020-12-04 NOTE — Progress Notes (Signed)
Subjective:    Patient ID: Maurice Colon, male    DOB: 1933-05-02, 87 y.o.   MRN: [id number]  DOS:  12/04/2020 Type of visit - description: Follow-up Since the last OV is doing very well. Today we talk about blood pressure, DJD, cholesterol. He takes meloxicam very infrequently, sometimes only once a month.    Review of Systems Denies chest pain, difficulty breathing. No lower extremity edema No nausea, vomiting, diarrhea.  No blood in the stool  Past Medical History:  Diagnosis Date  . DJD (degenerative joint disease)   . Hyperlipidemia    Hypertriglyceridemia  . Hypertension   . Hypogonadism, male   . Osteoporosis   . Prediabetes 07/16/2013  . Rosacea     Past Surgical History:  Procedure Laterality Date  . CATARACT EXTRACTION     Bilaterally  . INGUINAL HERNIA REPAIR      Allergies as of 12/04/2020      Reactions   Ace Inhibitors    REACTION: cough   Amlodipine Besylate    REACTION: edema   Metformin And Related Nausea Only   Intolerant, no allergic reaction, see OV 08-30-17      Medication List       Accurate as of Dec 04, 2020 11:59 PM. If you have any questions, ask your nurse or doctor.        STOP taking these medications   aspirin 81 MG tablet Stopped by: Willow Ora, MD     TAKE these medications   atorvastatin 10 MG tablet Commonly known as: LIPITOR Take 1 tablet (10 mg total) by mouth daily.   augmented betamethasone dipropionate 0.05 % cream Commonly known as: DIPROLENE-AF Apply topically 2 (two) times daily as needed. What changed:   when to take this  reasons to take this Changed by: Willow Ora, MD   cholecalciferol 1000 units tablet Commonly known as: VITAMIN D Take 1,000 Units by mouth daily. Reported on 09/16/2015   losartan-hydrochlorothiazide 100-25 MG tablet Commonly known as: HYZAAR Take 1 tablet by mouth daily.   meloxicam 7.5 MG tablet Commonly known as: MOBIC Take 1-2 tablets (7.5-15 mg total) by mouth daily  as needed for pain.   potassium chloride 10 MEQ tablet Commonly known as: KLOR-CON Take 1 tablet (10 mEq total) by mouth daily.   terazosin 1 MG capsule Commonly known as: HYTRIN Take 1 capsule (1 mg total) by mouth at bedtime.          Objective:   Physical Exam BP (!) 158/76 (BP Location: Left Arm, Patient Position: Sitting, Cuff Size: Normal)   Pulse 60   Temp 98 F (36.7 C) (Oral)   Resp 16   Ht 5\' 7"  (1.702 m)   Wt 199 lb 4 oz (90.4 kg)   SpO2 98%   BMI 31.21 kg/m  General:   Well developed, NAD, BMI noted. HEENT:  Normocephalic . Face symmetric, atraumatic Lungs:  CTA B Normal respiratory effort, no intercostal retractions, no accessory muscle use. Heart: RRR,  no murmur.  Lower extremities: no pretibial edema bilaterally  Skin: Not pale. Not jaundice Neurologic:  alert & oriented X3.  Speech normal, gait appropriate for age and unassisted Psych--  Cognition and judgment appear intact.  Cooperative with normal attention span and concentration.  Behavior appropriate. No anxious or depressed appearing.      Assessment       Assessment  Diabetes: tried metformin, see note 08-30-17, has s/e, ok to try again if needed (low dose) HTN:  amlodipine: edema  Dyslipidemia,  high triglycerides Hypogonadism- was unable to afford HRT 2012, no sx  Osteoporosis: T score -2.4  (05-2012). T score -2.3 (05-2016): Rx Ca, vitamin D Vitamin D deficiency DJD-- meloxicam prn Rosacea Increase LFTs -- First noted ~ 04-2015, no etoh/tylenol. changed simvastatin to Lipitor 04-2014 and Lipitor was d/c 2-17. We also d/c fenofibrates 09-2015; LFTs back to normal  01-2016  Fatty liver per [place] 07-18-2015 Hep B-C  Neg 07-2015;  ceruloplasmin, ferritin, iron and alpha-1 antitrypsin ---> (-) 09-2015 EKG 2020, Wenckebach  PLAN: DM, diet controlled, last A1c excellent HTN: BP today slightly elevated, at home consistently in the 140s/70s, he is 87 years old, no change, continue Hyzaar,  potassium.  Check a BMP Dyslipidemia, high triglycerides: Patient is fasting, request FLP, will do, continue Lipitor Aspirin: Okay to stop, no history of CAD or stroke Rash: Has on and off rash, well controlled with topical steroids, request a refill, sent Vaccine advise---Recommend COVID-vaccine #4, plans to proceed. RTC CPX 4 months    This visit occurred during the SARS-CoV-2 public health emergency.  Safety protocols were in place, including screening questions prior to the visit, additional usage of staff PPE, and extensive cleaning of exam room while observing appropriate contact time as indicated for disinfecting solutions.

## 2020-12-06 NOTE — Assessment & Plan Note (Signed)
DM, diet controlled, last A1c excellent HTN: BP today slightly elevated, at home consistently in the 140s/70s, he is 87 years old, no change, continue Hyzaar, potassium.  Check a BMP Dyslipidemia, high triglycerides: Patient is fasting, request FLP, will do, continue Lipitor Aspirin: Okay to stop, no history of CAD or stroke Rash: Has on and off rash, well controlled with topical steroids, request a refill, sent Vaccine advise---Recommend COVID-vaccine #4, plans to proceed. RTC CPX 4 months

## 2020-12-09 ENCOUNTER — Other Ambulatory Visit (HOSPITAL_BASED_OUTPATIENT_CLINIC_OR_DEPARTMENT_OTHER): Payer: Self-pay

## 2020-12-09 MED ORDER — PFIZER-BIONT COVID-19 VAC-TRIS 30 MCG/0.3ML IM SUSP
INTRAMUSCULAR | 0 refills | Status: DC
  Filled 2020-12-09: qty 0.3, 1d supply, fill #0

## 2021-01-01 ENCOUNTER — Other Ambulatory Visit: Payer: Self-pay | Admitting: Internal Medicine

## 2021-01-12 ENCOUNTER — Other Ambulatory Visit: Payer: Self-pay | Admitting: Internal Medicine

## 2021-01-12 DIAGNOSIS — E785 Hyperlipidemia, unspecified: Secondary | ICD-10-CM

## 2021-02-10 ENCOUNTER — Other Ambulatory Visit: Payer: Self-pay | Admitting: Internal Medicine

## 2021-04-03 ENCOUNTER — Other Ambulatory Visit (HOSPITAL_BASED_OUTPATIENT_CLINIC_OR_DEPARTMENT_OTHER): Payer: Self-pay

## 2021-04-07 ENCOUNTER — Encounter: Payer: Medicare HMO | Admitting: Internal Medicine

## 2021-04-15 ENCOUNTER — Ambulatory Visit (INDEPENDENT_AMBULATORY_CARE_PROVIDER_SITE_OTHER): Payer: Medicare HMO | Admitting: Internal Medicine

## 2021-04-15 ENCOUNTER — Other Ambulatory Visit: Payer: Self-pay

## 2021-04-15 ENCOUNTER — Encounter: Payer: Self-pay | Admitting: Internal Medicine

## 2021-04-15 VITALS — BP 134/66 | HR 46 | Temp 98.0°F | Resp 16 | Ht 67.0 in | Wt 200.0 lb

## 2021-04-15 DIAGNOSIS — Z23 Encounter for immunization: Secondary | ICD-10-CM

## 2021-04-15 DIAGNOSIS — I10 Essential (primary) hypertension: Secondary | ICD-10-CM | POA: Diagnosis not present

## 2021-04-15 DIAGNOSIS — E785 Hyperlipidemia, unspecified: Secondary | ICD-10-CM | POA: Diagnosis not present

## 2021-04-15 DIAGNOSIS — Z0001 Encounter for general adult medical examination with abnormal findings: Secondary | ICD-10-CM | POA: Diagnosis not present

## 2021-04-15 DIAGNOSIS — F439 Reaction to severe stress, unspecified: Secondary | ICD-10-CM | POA: Diagnosis not present

## 2021-04-15 DIAGNOSIS — E559 Vitamin D deficiency, unspecified: Secondary | ICD-10-CM

## 2021-04-15 DIAGNOSIS — Z0000 Encounter for general adult medical examination without abnormal findings: Secondary | ICD-10-CM

## 2021-04-15 DIAGNOSIS — E119 Type 2 diabetes mellitus without complications: Secondary | ICD-10-CM | POA: Diagnosis not present

## 2021-04-15 NOTE — Progress Notes (Signed)
Subjective:    Patient ID: Maurice Colon, male    DOB: [birth year], 87 y.o.   MRN: [id number]  DOS:  04/15/2021 Type of visit - description: CPX  In addition to CPX we addressed all his chronic medical problems. He admits to stress, had a death in the family recently, also his daughter is bipolar and often times she disrupts his sleep at night. The patient  denies depression or anxiety per se.  Apparently dealing with above issues very well.  Review of Systems  Other than above, a 14 point review of systems is negative       Past Medical History:  Diagnosis Date   DJD (degenerative joint disease)    Hyperlipidemia    Hypertriglyceridemia   Hypertension    Hypogonadism, male    Osteoporosis    Prediabetes 07/16/2013   Rosacea     Past Surgical History:  Procedure Laterality Date   CATARACT EXTRACTION     Bilaterally   INGUINAL HERNIA REPAIR     Social History   Socioeconomic History   Marital status: Married    Spouse name: Not on file   Number of children: 2   Years of education: Not on file   Highest education level: Not on file  Occupational History   Occupation: retired    Associate Professor: RETIRED  Tobacco Use   Smoking status: Never   Smokeless tobacco: Never  Substance and Sexual Activity   Alcohol use: No    Comment: wine, occasionally   Drug use: Not on file   Sexual activity: Not on file  Other Topics Concern   Not on file  Social History Narrative   Original from [place]   Lives w/ wife and 1 daughter (bipolar)   Moved to [place] early 2008      Social Determinants of Health   Financial Resource Strain: Not on file  Food Insecurity: Not on file  Transportation Needs: Not on file  Physical Activity: Not on file  Stress: Not on file  Social Connections: Not on file  Intimate Partner Violence: Not on file    Allergies as of 04/15/2021       Reactions   Ace Inhibitors    REACTION: cough   Amlodipine Besylate    REACTION: edema    Metformin And Related Nausea Only   Intolerant, no allergic reaction, see OV 08-30-17        Medication List        Accurate as of April 15, 2021 11:59 PM. If you have any questions, ask your nurse or doctor.          STOP taking these medications    [employer] COVID-19 Vac-TriS Susp injection Generic drug: COVID-19 mRNA Vac-TriS Proofreader) Stopped by: Willow Ora, MD       TAKE these medications    atorvastatin 10 MG tablet Commonly known as: LIPITOR Take 1 tablet (10 mg total) by mouth daily.   augmented betamethasone dipropionate 0.05 % cream Commonly known as: DIPROLENE-AF Apply topically 2 (two) times daily as needed.   cholecalciferol 1000 units tablet Commonly known as: VITAMIN D Take 1,000 Units by mouth daily. Reported on 09/16/2015   losartan-hydrochlorothiazide 100-25 MG tablet Commonly known as: HYZAAR Take 1 tablet by mouth daily.   meloxicam 7.5 MG tablet Commonly known as: MOBIC Take 1-2 tablets (7.5-15 mg total) by mouth daily as needed for pain.   potassium chloride 10 MEQ tablet Commonly known as: KLOR-CON Take 1 tablet (10 mEq  total) by mouth daily.   terazosin 1 MG capsule Commonly known as: HYTRIN Take 1 capsule (1 mg total) by mouth at bedtime.           Objective:   Physical Exam BP 134/66 (BP Location: Left Arm, Patient Position: Sitting, Cuff Size: Normal)   Pulse (!) 46   Temp 98 F (36.7 C) (Oral)   Resp 16   Ht 5\' 7"  (1.702 m)   Wt 200 lb (90.7 kg)   SpO2 96%   BMI 31.32 kg/m  General: Well developed, NAD, BMI noted Neck: No  thyromegaly  HEENT:  Normocephalic . Face symmetric, atraumatic Lungs:  CTA B Normal respiratory effort, no intercostal retractions, no accessory muscle use. Heart: Bradycardic,  no murmur.  Abdomen:  Not distended, soft, non-tender. No rebound or rigidity.   DM foot exam: No edema, good pedal pulses, pinprick examination normal Skin: Exposed areas without rash. Not pale. Not  jaundice Neurologic:  alert & oriented X3.  Speech normal, gait appropriate for age and unassisted Strength symmetric and appropriate for age.  Psych: Cognition and judgment appear intact.  Cooperative with normal attention span and concentration.  Behavior appropriate. No anxious or depressed appearing.     Assessment    Assessment  Diabetes: tried metformin, see note 08-30-17, has s/e, ok to try again if needed (low dose) HTN: amlodipine: edema  Dyslipidemia,  high triglycerides Hypogonadism- was unable to afford HRT 2012, no sx  Osteoporosis: T score -2.4  (05-2012). T score -2.3 (05-2016): Rx Ca, vitamin D Vitamin D deficiency DJD-- meloxicam prn Rosacea Increase LFTs -- First noted ~ 04-2015, no etoh/tylenol. changed simvastatin to Lipitor 04-2014 and Lipitor was d/c 2-17. We also d/c fenofibrates 09-2015; LFTs back to normal  01-2016  Fatty liver per 02-17-2002 07-18-2015 Hep B-C  Neg 07-2015;  ceruloplasmin, ferritin, iron and alpha-1 antitrypsin ---> (-) 09-2015 EKG 2020, Wenckebach  PLAN: Here for CPX DM: Diet controlled, check A1c. HTN: No ambulatory BPs, BP today is very good, continue Hyzaar, potassium.  Recent BMP okay. High cholesterol: Well-controlled, continue Lipitor, check LFTs Vitamin D deficiency: Labs Wenckebach: Bradycardic, heart rate 46, he specifically denies dizziness, syncope or presyncope. Stress, related to recent death in the family, his daughter has bipolar disorder, dealing with the stress okay. Instructions written in English and discussed in Spanish RTC 4 months  In addition to CPX, I addressed his chronic medical problems, ordered appropriate labs.  We also addressed his stress, listening therapy provided.  This visit occurred during the SARS-CoV-2 public health emergency.  Safety protocols were in place, including screening questions prior to the visit, additional usage of staff PPE, and extensive cleaning of exam room while observing appropriate  contact time as indicated for disinfecting solutions.

## 2021-04-15 NOTE — Patient Instructions (Addendum)
Recommend to proceed with the following vaccines at your pharmacy:  Shingrix (shingles) #2 Tdap (tetanus) Flu shot this fall COVID vaccination in 1 or 2 months   Check the  blood pressure 2 twice a month  BP GOAL is between 110/65 and  135/85. If it is consistently higher or lower, let me know   GO TO THE LAB : Get the blood work     GO TO THE FRONT DESK, PLEASE SCHEDULE YOUR APPOINTMENTS Come back for a checkup in 4 months     "Living will", "Health Care Power of attorney": Advanced care planning  (If you already have a living will or healthcare power of attorney, please bring the copy to be scanned in your chart.)  Advance care planning is a process that supports adults in  understanding and sharing their preferences regarding future medical care.   The patient's preferences are recorded in documents called Advance Directives.    Advanced directives are completed (and can be modified at any time) while the patient is in full mental capacity.   The documentation should be available at all times to the patient, the family and the healthcare providers.  Bring in a copy to be scanned in your chart is an excellent idea and is recommended   This legal documents direct treatment decision making and/or appoint a surrogate to make the decision if the patient is not capable to do so.    Advance directives can be documented in many types of formats,  documents have names such as:  Lliving will  Durable power of attorney for healthcare (healthcare proxy or healthcare power of attorney)  Combined directives  Physician orders for life-sustaining treatment    More information at:  [email or web address]

## 2021-04-16 ENCOUNTER — Encounter: Payer: Self-pay | Admitting: Internal Medicine

## 2021-04-16 LAB — CBC WITH DIFFERENTIAL/PLATELET
Basophils Absolute: 0 10*3/uL (ref 0.0–0.1)
Basophils Relative: 0.8 % (ref 0.0–3.0)
Eosinophils Absolute: 0.3 10*3/uL (ref 0.0–0.7)
Eosinophils Relative: 4.8 % (ref 0.0–5.0)
HCT: 41.1 % (ref 39.0–52.0)
Hemoglobin: 13.9 g/dL (ref 13.0–17.0)
Lymphocytes Relative: 27.1 % (ref 12.0–46.0)
Lymphs Abs: 1.6 10*3/uL (ref 0.7–4.0)
MCHC: 33.7 g/dL (ref 30.0–36.0)
MCV: 91.9 fl (ref 78.0–100.0)
Monocytes Absolute: 0.5 10*3/uL (ref 0.1–1.0)
Monocytes Relative: 9 % (ref 3.0–12.0)
Neutro Abs: 3.5 10*3/uL (ref 1.4–7.7)
Neutrophils Relative %: 58.3 % (ref 43.0–77.0)
Platelets: 237 10*3/uL (ref 150.0–400.0)
RBC: 4.47 Mil/uL (ref 4.22–5.81)
RDW: 13.7 % (ref 11.5–15.5)
WBC: 5.9 10*3/uL (ref 4.0–10.5)

## 2021-04-16 LAB — HEMOGLOBIN A1C: Hgb A1c MFr Bld: 5.9 % (ref 4.6–6.5)

## 2021-04-16 LAB — TSH: TSH: 2.43 u[IU]/mL (ref 0.35–5.50)

## 2021-04-16 LAB — VITAMIN D 25 HYDROXY (VIT D DEFICIENCY, FRACTURES): VITD: 38.73 ng/mL (ref 30.00–100.00)

## 2021-04-16 LAB — AST: AST: 23 U/L (ref 0–37)

## 2021-04-16 LAB — ALT: ALT: 21 U/L (ref 0–53)

## 2021-04-16 NOTE — Assessment & Plan Note (Signed)
Here for CPX DM: Diet controlled, check A1c. HTN: No ambulatory BPs, BP today is very good, continue Hyzaar, potassium.  Recent BMP okay. High cholesterol: Well-controlled, continue Lipitor, check LFTs Vitamin D deficiency: Labs Wenckebach: Bradycardic, heart rate 46, he specifically denies dizziness, syncope or presyncope. Stress, related to recent death in the family, his daughter has bipolar disorder, dealing with the stress okay. Instructions written in English and discussed in Spanish RTC 4 months

## 2021-04-16 NOTE — Assessment & Plan Note (Signed)
-  Td: 2011 booster recommended - PNM 23: 2008, 2019; prevnar: 2016 - s/p zostavax, no documentation; shingrex x1? Encouraged 2nd dose - s/p covid vax x 4, last 12-04-20, , rec booster this fall  - rec flu shot >> today  -CCS: Colonoscopy 12-11,  (-), no further CCS - prostate ca screening: no further screening   -Labs: CBC, A1c, TSH, vitamin D, AST ALT - Diet ,exercise d/w pt   -POA discussed

## 2021-05-09 ENCOUNTER — Other Ambulatory Visit: Payer: Self-pay | Admitting: Internal Medicine

## 2021-07-13 DIAGNOSIS — H04123 Dry eye syndrome of bilateral lacrimal glands: Secondary | ICD-10-CM | POA: Diagnosis not present

## 2021-07-13 DIAGNOSIS — H35373 Puckering of macula, bilateral: Secondary | ICD-10-CM | POA: Diagnosis not present

## 2021-07-13 DIAGNOSIS — H353131 Nonexudative age-related macular degeneration, bilateral, early dry stage: Secondary | ICD-10-CM | POA: Diagnosis not present

## 2021-07-13 LAB — HM DIABETES EYE EXAM

## 2021-08-14 ENCOUNTER — Ambulatory Visit (INDEPENDENT_AMBULATORY_CARE_PROVIDER_SITE_OTHER): Payer: Medicare HMO | Admitting: Internal Medicine

## 2021-08-14 ENCOUNTER — Encounter: Payer: Self-pay | Admitting: Internal Medicine

## 2021-08-14 VITALS — BP 142/80 | HR 73 | Temp 98.3°F | Resp 16 | Ht 67.0 in | Wt 202.2 lb

## 2021-08-14 DIAGNOSIS — E119 Type 2 diabetes mellitus without complications: Secondary | ICD-10-CM | POA: Diagnosis not present

## 2021-08-14 DIAGNOSIS — I10 Essential (primary) hypertension: Secondary | ICD-10-CM | POA: Diagnosis not present

## 2021-08-14 DIAGNOSIS — E559 Vitamin D deficiency, unspecified: Secondary | ICD-10-CM

## 2021-08-14 LAB — BASIC METABOLIC PANEL
BUN: 18 mg/dL (ref 6–23)
CO2: 28 mEq/L (ref 19–32)
Calcium: 9.6 mg/dL (ref 8.4–10.5)
Chloride: 101 mEq/L (ref 96–112)
Creatinine, Ser: 1.03 mg/dL (ref 0.40–1.50)
GFR: 65.88 mL/min (ref >60.00)
Glucose, Bld: 143 mg/dL — ABNORMAL HIGH (ref 70–99)
Potassium: 3.8 mEq/L (ref 3.5–5.1)
Sodium: 138 mEq/L (ref 135–145)

## 2021-08-14 NOTE — Assessment & Plan Note (Signed)
DM: Diet controlled, last A1c very good HTN: BP today satisfactory, continue Hyzaar, potassium, terazosin.  Check BMP. Vitamin D deficiency: Last levels very good. Immunizations: Reviewed, up-to-date on COVID and influenza vaccines. Stress: See last visit, stress has decreased significantly.  Doing well emotionally RTC 6 months for routine checkup

## 2021-08-14 NOTE — Progress Notes (Signed)
Subjective:    Patient ID: Maurice Colon, male    DOB: 08-Apr-1933, 88 y.o.   MRN: [id number]  DOS:  08/14/2021 Type of visit - description: Routine visit  Since the last office visit reports he is doing well. Denies any stress. Denies chest pain or difficulty breathing.No palpitations. No lower extremity paresthesias. We reviewed together his previous labs. Also reviewed his immunizations  Review of Systems See above   Past Medical History:  Diagnosis Date   DJD (degenerative joint disease)    Hyperlipidemia    Hypertriglyceridemia   Hypertension    Hypogonadism, male    Osteoporosis    Prediabetes 07/16/2013   Rosacea     Past Surgical History:  Procedure Laterality Date   CATARACT EXTRACTION     Bilaterally   INGUINAL HERNIA REPAIR      Current Outpatient Medications  Medication Instructions   atorvastatin (LIPITOR) 10 mg, Oral, Daily   augmented betamethasone dipropionate (DIPROLENE-AF) 0.05 % cream Topical, 2 times daily PRN   cholecalciferol (VITAMIN D) 1,000 Units, Oral, Daily, Reported on 09/16/2015   losartan-hydrochlorothiazide (HYZAAR) 100-25 MG tablet 1 tablet, Oral, Daily   meloxicam (MOBIC) 7.5 MG tablet TAKE 1 TO 2 TABLETS EVERY DAY AS NEEDED FOR PAIN   potassium chloride (KLOR-CON) 10 MEQ tablet 10 mEq, Oral, Daily   terazosin (HYTRIN) 1 MG capsule TAKE 1 CAPSULE AT BEDTIME       Objective:   Physical Exam BP (!) 142/80 (BP Location: Left Arm, Patient Position: Sitting, Cuff Size: Normal)    Pulse 73    Temp 98.3 F (36.8 C) (Oral)    Resp 16    Ht 5\' 7"  (1.702 m)    Wt 202 lb 4 oz (91.7 kg)    SpO2 96%    BMI 31.68 kg/m  General:   Well developed, NAD, BMI noted. HEENT:  Normocephalic . Face symmetric, atraumatic Lungs:  CTA B Normal respiratory effort, no intercostal retractions, no accessory muscle use. Heart: RRR,  no murmur.  Lower extremities: no pretibial edema bilaterally  Skin: Not pale. Not jaundice Neurologic:   alert & oriented X3.  Speech normal, gait appropriate for age and unassisted Psych--  Cognition and judgment appear intact.  Cooperative with normal attention span and concentration.  Behavior appropriate. No anxious or depressed appearing.      Assessment      Assessment  Diabetes: tried metformin, see note 08-30-17, has s/e, ok to try again if needed (low dose) HTN: amlodipine: edema  Dyslipidemia,  high triglycerides Hypogonadism- was unable to afford HRT 2012, no sx  Osteoporosis: T score -2.4  (05-2012). T score -2.3 (05-2016): Rx Ca, vitamin D Vitamin D deficiency DJD-- meloxicam prn Rosacea Increase LFTs -- First noted ~ 04-2015, no etoh/tylenol. changed simvastatin to Lipitor 04-2014 and Lipitor was d/c 2-17. We also d/c fenofibrates 09-2015; LFTs back to normal  01-2016  Fatty liver per [place] 07-18-2015 Hep B-C  Neg 07-2015;  ceruloplasmin, ferritin, iron and alpha-1 antitrypsin ---> (-) 09-2015 EKG 2020, Wenckebach  PLAN: DM: Diet controlled, last A1c very good HTN: BP today satisfactory, continue Hyzaar, potassium, terazosin.  Check BMP. Vitamin D deficiency: Last levels very good. Immunizations: Reviewed, up-to-date on COVID and influenza vaccines. Stress: See last visit, stress has decreased significantly.  Doing well emotionally RTC 6 months for routine checkup     This visit occurred during the SARS-CoV-2 public health emergency.  Safety protocols were in place, including screening questions prior to the visit, additional  usage of staff PPE, and extensive cleaning of exam room while observing appropriate contact time as indicated for disinfecting solutions.

## 2021-08-14 NOTE — Patient Instructions (Addendum)
Per our records you are due for your diabetic eye exam. Please contact your eye doctor to schedule an appointment. Please have them send copies of your office visit notes to us. Our fax number is [phone]. If you need a referral to an eye doctor please let us know. ° ° °Check the  blood pressure regularly °BP GOAL is between 110/65 and  135/85. °If it is consistently higher or lower, let me know °  ° °GO TO THE LAB : Get the blood work   ° ° °GO TO THE FRONT DESK, PLEASE SCHEDULE YOUR APPOINTMENTS °Come back for a checkup in 6 months ° ° °

## 2021-10-01 ENCOUNTER — Other Ambulatory Visit: Payer: Self-pay | Admitting: Internal Medicine

## 2021-10-01 ENCOUNTER — Encounter: Payer: Self-pay | Admitting: Internal Medicine

## 2021-10-01 DIAGNOSIS — E785 Hyperlipidemia, unspecified: Secondary | ICD-10-CM

## 2021-10-09 ENCOUNTER — Encounter: Payer: Self-pay | Admitting: Internal Medicine

## 2021-10-09 ENCOUNTER — Ambulatory Visit (INDEPENDENT_AMBULATORY_CARE_PROVIDER_SITE_OTHER): Payer: Medicare HMO | Admitting: Internal Medicine

## 2021-10-09 VITALS — BP 146/92 | HR 76 | Temp 97.8°F | Resp 16 | Ht 67.0 in | Wt 199.5 lb

## 2021-10-09 DIAGNOSIS — F419 Anxiety disorder, unspecified: Secondary | ICD-10-CM | POA: Diagnosis not present

## 2021-10-09 DIAGNOSIS — E559 Vitamin D deficiency, unspecified: Secondary | ICD-10-CM | POA: Diagnosis not present

## 2021-10-09 DIAGNOSIS — I10 Essential (primary) hypertension: Secondary | ICD-10-CM | POA: Diagnosis not present

## 2021-10-09 NOTE — Patient Instructions (Signed)
Check the  blood pressure regularly ?BP GOAL is between 110/65 and  135/85. ?If it is consistently higher or lower, let me know ?  ? ?GO TO THE FRONT DESK, PLEASE SCHEDULE YOUR APPOINTMENTS ?Come back for a checkup in 3 to 4 months ?

## 2021-10-09 NOTE — Progress Notes (Signed)
? ?Subjective:  ? ? Patient ID: Maurice Colon, male    DOB: 11-11-1932, 88 y.o.   MRN: [id number] ? ?DOS:  10/09/2021 ?Type of visit - description: Acute ? ?Here for a early visit. ?He has been quite stressed, his daughter (she is my patient) is bipolar, she is a heavy smoker, often does not sleep at night. ?The patient and his wife provide most of the care for her.  ?He denies depression per se. ?Today he had a rough day but most days he is pretty okay with the situation. ? ?Medications reviewed, ambulatory BPs very good. ? ? Review of Systems ?See above  ? ?Past Medical History:  ?Diagnosis Date  ? DJD (degenerative joint disease)   ? Hyperlipidemia   ? Hypertriglyceridemia  ? Hypertension   ? Hypogonadism, male   ? Osteoporosis   ? Prediabetes 07/16/2013  ? Rosacea   ? ? ?Past Surgical History:  ?Procedure Laterality Date  ? CATARACT EXTRACTION    ? Bilaterally  ? INGUINAL HERNIA REPAIR    ? ? ?Current Outpatient Medications  ?Medication Instructions  ? atorvastatin (LIPITOR) 10 MG tablet TAKE 1 TABLET EVERY DAY  ? augmented betamethasone dipropionate (DIPROLENE-AF) 0.05 % cream Topical, 2 times daily PRN  ? cholecalciferol (VITAMIN D) 1,000 Units, Daily  ? losartan-hydrochlorothiazide (HYZAAR) 100-25 MG tablet TAKE 1 TABLET EVERY DAY  ? meloxicam (MOBIC) 7.5 MG tablet TAKE 1 TO 2 TABLETS EVERY DAY AS NEEDED FOR PAIN  ? potassium chloride (KLOR-CON M) 10 MEQ tablet TAKE 1 TABLET EVERY DAY  ? terazosin (HYTRIN) 1 MG capsule TAKE 1 CAPSULE AT BEDTIME  ? ? ?   ?Objective:  ? Physical Exam ?BP (!) 146/92 (BP Location: Left Arm, Patient Position: Sitting, Cuff Size: Small)   Pulse 76   Temp 97.8 ?F (36.6 ?C) (Oral)   Resp 16   Ht 5\' 7"  (1.702 m)   Wt 199 lb 8 oz (90.5 kg)   SpO2 97%   BMI 31.25 kg/m?  ?General:   ?Well developed, NAD, BMI noted. ?HEENT:  ?Normocephalic . Face symmetric, atraumatic ?Skin: Not pale. Not jaundice ?Neurologic:  ?alert & oriented X3.  ?Speech normal, gait appropriate for  age and unassisted ?Psych--  ?Cognition and judgment appear intact.  ?Cooperative with normal attention span and concentration.  ?Behavior appropriate. ?No anxious or depressed appearing.  ? ?   ?Assessment   ? ?  ?Assessment  ?Diabetes: tried metformin, see note 08-30-17, has s/e, ok to try again if needed (low dose) ?HTN: amlodipine: edema  ?Dyslipidemia,  high triglycerides ?Hypogonadism- was unable to afford HRT 2012, no sx  ?Osteoporosis: T score -2.4  (05-2012). T score -2.3 (05-2016): Rx Ca, vitamin D ?Vitamin D deficiency ?DJD-- meloxicam prn ?Rosacea ?Increase LFTs -- ?First noted ~ 04-2015, no etoh/tylenol. changed simvastatin to Lipitor 04-2014 and Lipitor was d/c 2-17. We also d/c fenofibrates 09-2015; LFTs back to normal  01-2016  ?Fatty liver per 06-18-1972 07-18-2015 ?Hep B-C  Neg 07-2015;  ceruloplasmin, ferritin, iron and alpha-1 antitrypsin ---> (-) 09-2015 ?EKG 2020, Wenckebach ? ?PLAN: ?Anxiety: Reported stress related to taking care of his daughter, she is bipolar and has some behavioral issues.  Sometimes he gets tired of the situation (caregiver fatigue). ?Listening therapy provided, declines the need to take any medication.  Recommend to reach out if he thinks needs additional help. ?HTN: Good med compliance, on Hyzaar, potassium, terazosin.  Ambulatory BPs in the 130s/70-80. ?Last BMP okay.  No change. ?DJD: Does not take  meloxicam frequently. ?Vitamin D deficiency: Currently on vitamin D3 over-the-counter 1000 units daily. ?RTC 3 to 4 months ? ?This visit occurred during the SARS-CoV-2 public health emergency.  Safety protocols were in place, including screening questions prior to the visit, additional usage of staff PPE, and extensive cleaning of exam room while observing appropriate contact time as indicated for disinfecting solutions.  ? ?

## 2021-10-10 DIAGNOSIS — F419 Anxiety disorder, unspecified: Secondary | ICD-10-CM | POA: Insufficient documentation

## 2021-10-10 NOTE — Assessment & Plan Note (Signed)
Anxiety: Reported stress related to taking care of his daughter, she is bipolar and has some behavioral issues.  Sometimes he gets tired of the situation (caregiver fatigue). ?Listening therapy provided, declines the need to take any medication.  Recommend to reach out if he thinks needs additional help. ?HTN: Good med compliance, on Hyzaar, potassium, terazosin.  Ambulatory BPs in the 130s/70-80. ?Last BMP okay.  No change. ?DJD: Does not take meloxicam frequently. ?Vitamin D deficiency: Currently on vitamin D3 over-the-counter 1000 units daily. ?RTC 3 to 4 months ?

## 2021-10-14 ENCOUNTER — Encounter: Payer: Self-pay | Admitting: Internal Medicine

## 2022-01-11 ENCOUNTER — Ambulatory Visit: Payer: Medicare HMO | Admitting: Internal Medicine

## 2022-01-20 ENCOUNTER — Encounter: Payer: Self-pay | Admitting: Internal Medicine

## 2022-01-20 ENCOUNTER — Ambulatory Visit (INDEPENDENT_AMBULATORY_CARE_PROVIDER_SITE_OTHER): Payer: Medicare HMO | Admitting: Internal Medicine

## 2022-01-20 VITALS — BP 134/82 | HR 62 | Temp 97.9°F | Resp 18 | Ht 67.0 in | Wt 199.2 lb

## 2022-01-20 DIAGNOSIS — E119 Type 2 diabetes mellitus without complications: Secondary | ICD-10-CM

## 2022-01-20 DIAGNOSIS — I10 Essential (primary) hypertension: Secondary | ICD-10-CM

## 2022-01-20 DIAGNOSIS — E785 Hyperlipidemia, unspecified: Secondary | ICD-10-CM

## 2022-01-20 LAB — HEMOGLOBIN A1C: Hgb A1c MFr Bld: 6 % (ref 4.6–6.5)

## 2022-01-20 LAB — LIPID PANEL
Cholesterol: 144 mg/dL (ref 0–200)
HDL: 33.1 mg/dL — ABNORMAL LOW (ref >39.00)
NonHDL: 110.57
Total CHOL/HDL Ratio: 4
Triglycerides: 281 mg/dL — ABNORMAL HIGH (ref 0.0–149.0)
VLDL: 56.2 mg/dL — ABNORMAL HIGH (ref 0.0–40.0)

## 2022-01-20 LAB — BASIC METABOLIC PANEL
BUN: 13 mg/dL (ref 6–23)
CO2: 27 mEq/L (ref 19–32)
Calcium: 9.8 mg/dL (ref 8.4–10.5)
Chloride: 102 mEq/L (ref 96–112)
Creatinine, Ser: 1.05 mg/dL (ref 0.40–1.50)
GFR: 64.18 mL/min (ref >60.00)
Glucose, Bld: 102 mg/dL — ABNORMAL HIGH (ref 70–99)
Potassium: 4 mEq/L (ref 3.5–5.1)
Sodium: 137 mEq/L (ref 135–145)

## 2022-01-20 LAB — LDL CHOLESTEROL, DIRECT: Direct LDL: 63 mg/dL

## 2022-01-20 NOTE — Progress Notes (Signed)
   Subjective:    Patient ID: Maurice Colon, male    DOB: 06-Feb-1933, 88 y.o.   MRN: [id number]  DOS:  01/20/2022 Type of visit - description: Follow-up  Since the last office visit he is doing well. Still having some stress but overall feels better. Still take meloxicam sporadically for aches and pains. Taking vitamin D supplement  Review of Systems See above   Past Medical History:  Diagnosis Date   DJD (degenerative joint disease)    Hyperlipidemia    Hypertriglyceridemia   Hypertension    Hypogonadism, male    Osteoporosis    Prediabetes 07/16/2013   Rosacea     Past Surgical History:  Procedure Laterality Date   CATARACT EXTRACTION     Bilaterally   INGUINAL HERNIA REPAIR      Current Outpatient Medications  Medication Instructions   atorvastatin (LIPITOR) 10 MG tablet TAKE 1 TABLET EVERY DAY   augmented betamethasone dipropionate (DIPROLENE-AF) 0.05 % cream Topical, 2 times daily PRN   losartan-hydrochlorothiazide (HYZAAR) 100-25 MG tablet TAKE 1 TABLET EVERY DAY   meloxicam (MOBIC) 7.5 MG tablet TAKE 1 TO 2 TABLETS EVERY DAY AS NEEDED FOR PAIN   potassium chloride (KLOR-CON M) 10 MEQ tablet TAKE 1 TABLET EVERY DAY   terazosin (HYTRIN) 1 MG capsule TAKE 1 CAPSULE AT BEDTIME       Objective:   Physical Exam BP 134/82   Pulse 62   Temp 97.9 F (36.6 C) (Oral)   Resp 18   Ht 5\' 7"  (1.702 m)   Wt 199 lb 4 oz (90.4 kg)   SpO2 94%   BMI 31.21 kg/m  General:   Well developed, NAD, BMI noted. HEENT:  Normocephalic . Face symmetric, atraumatic Lungs:  CTA B Normal respiratory effort, no intercostal retractions, no accessory muscle use. Heart: RRR,  no murmur.  Lower extremities: no pretibial edema bilaterally  Skin: Not pale. Not jaundice Neurologic:  alert & oriented X3.  Speech normal, gait appropriate for age and unassisted Psych--  Cognition and judgment appear intact.  Cooperative with normal attention span and concentration.   Behavior appropriate. No anxious or depressed appearing.      Assessment      Assessment  Diabetes: tried metformin, see note 08-30-17, has s/e, ok to try again if needed (low dose) HTN: amlodipine: edema  Dyslipidemia,  high triglycerides Hypogonadism- was unable to afford HRT 2012, no sx  Osteoporosis: T score -2.4  (05-2012). T score -2.3 (05-2016): Rx Ca, vitamin D Vitamin D deficiency DJD-- meloxicam prn Rosacea Increase LFTs -- First noted ~ 04-2015, no etoh/tylenol. changed simvastatin to Lipitor 04-2014 and Lipitor was d/c 2-17. We also d/c fenofibrates 09-2015; LFTs back to normal  01-2016  Fatty liver per 02-17-2002 07-18-2015 Hep B-C  Neg 07-2015;  ceruloplasmin, ferritin, iron and alpha-1 antitrypsin ---> (-) 09-2015 EKG 2020, Wenckebach  PLAN: DM: Diet controlled, we talk about the need to eat healthy, low carbohydrates, increase physical activity including taking walks regularly.  We will check A1c HTN: BP is very good today, on Hyzaar, check a BMP. Dyslipidemia: Patient like to check his triglycerides, check FLP, continue atorvastatin. Anxiety: Overall things are stable, stress mostly related to her daughter who is bipolar. RTC 3 months CPX

## 2022-01-20 NOTE — Patient Instructions (Signed)
    GO TO THE LAB : Get the blood work     GO TO THE FRONT DESK, PLEASE SCHEDULE YOUR APPOINTMENTS Come back for a physical exam by 04-2022

## 2022-01-21 NOTE — Assessment & Plan Note (Signed)
DM: Diet controlled, we talk about the need to eat healthy, low carbohydrates, increase physical activity including taking walks regularly.  We will check A1c HTN: BP is very good today, on Hyzaar, check a BMP. Dyslipidemia: Patient like to check his triglycerides, check FLP, continue atorvastatin. Anxiety: Overall things are stable, stress mostly related to her daughter who is bipolar. RTC 3 months CPX

## 2022-02-03 ENCOUNTER — Other Ambulatory Visit: Payer: Self-pay | Admitting: Internal Medicine

## 2022-03-25 ENCOUNTER — Telehealth: Payer: Self-pay

## 2022-03-25 NOTE — Telephone Encounter (Signed)
Pt schedule CPE and AWV on 04-29-2022. Done

## 2022-03-25 NOTE — Telephone Encounter (Signed)
Pt speaks spanish- Maurice Colon and/or Maurice Colon- will you reach out to Pt to offer him an AWV and he also needs his CPX scheduled for 04/2022 please. Thank you.

## 2022-04-29 ENCOUNTER — Encounter: Payer: Medicare HMO | Admitting: Internal Medicine

## 2022-04-29 ENCOUNTER — Ambulatory Visit: Payer: Medicare HMO

## 2022-05-04 ENCOUNTER — Other Ambulatory Visit: Payer: Self-pay | Admitting: Internal Medicine

## 2022-05-26 ENCOUNTER — Encounter: Payer: Medicare HMO | Admitting: Internal Medicine

## 2022-05-26 ENCOUNTER — Ambulatory Visit: Payer: Medicare HMO

## 2022-06-01 ENCOUNTER — Ambulatory Visit (INDEPENDENT_AMBULATORY_CARE_PROVIDER_SITE_OTHER): Payer: Medicare HMO | Admitting: *Deleted

## 2022-06-01 ENCOUNTER — Encounter: Payer: Self-pay | Admitting: Internal Medicine

## 2022-06-01 ENCOUNTER — Ambulatory Visit (INDEPENDENT_AMBULATORY_CARE_PROVIDER_SITE_OTHER): Payer: Medicare HMO | Admitting: Internal Medicine

## 2022-06-01 VITALS — BP 150/88 | HR 78 | Temp 97.8°F | Resp 18 | Ht 67.0 in | Wt 197.2 lb

## 2022-06-01 VITALS — BP 179/65 | HR 61 | Ht 67.0 in | Wt 197.2 lb

## 2022-06-01 DIAGNOSIS — M810 Age-related osteoporosis without current pathological fracture: Secondary | ICD-10-CM | POA: Diagnosis not present

## 2022-06-01 DIAGNOSIS — Z23 Encounter for immunization: Secondary | ICD-10-CM | POA: Diagnosis not present

## 2022-06-01 DIAGNOSIS — M150 Primary generalized (osteo)arthritis: Secondary | ICD-10-CM

## 2022-06-01 DIAGNOSIS — Z0001 Encounter for general adult medical examination with abnormal findings: Secondary | ICD-10-CM

## 2022-06-01 DIAGNOSIS — Z0000 Encounter for general adult medical examination without abnormal findings: Secondary | ICD-10-CM | POA: Diagnosis not present

## 2022-06-01 DIAGNOSIS — M159 Polyosteoarthritis, unspecified: Secondary | ICD-10-CM | POA: Diagnosis not present

## 2022-06-01 DIAGNOSIS — I10 Essential (primary) hypertension: Secondary | ICD-10-CM | POA: Diagnosis not present

## 2022-06-01 DIAGNOSIS — E119 Type 2 diabetes mellitus without complications: Secondary | ICD-10-CM

## 2022-06-01 DIAGNOSIS — E785 Hyperlipidemia, unspecified: Secondary | ICD-10-CM | POA: Diagnosis not present

## 2022-06-01 LAB — COMPREHENSIVE METABOLIC PANEL
ALT: 23 U/L (ref 0–53)
AST: 26 U/L (ref 0–37)
Albumin: 4.3 g/dL (ref 3.5–5.2)
Alkaline Phosphatase: 91 U/L (ref 39–117)
BUN: 20 mg/dL (ref 6–23)
CO2: 26 mEq/L (ref 19–32)
Calcium: 9.4 mg/dL (ref 8.4–10.5)
Chloride: 104 mEq/L (ref 96–112)
Creatinine, Ser: 1.06 mg/dL (ref 0.40–1.50)
GFR: 63.29 mL/min (ref >60.00)
Glucose, Bld: 101 mg/dL — ABNORMAL HIGH (ref 70–99)
Potassium: 4.1 mEq/L (ref 3.5–5.1)
Sodium: 139 mEq/L (ref 135–145)
Total Bilirubin: 1.1 mg/dL (ref 0.2–1.2)
Total Protein: 7.8 g/dL (ref 6.0–8.3)

## 2022-06-01 LAB — LIPID PANEL
Cholesterol: 132 mg/dL (ref 0–200)
HDL: 33.8 mg/dL — ABNORMAL LOW (ref >39.00)
LDL Cholesterol: 60 mg/dL (ref 0–99)
NonHDL: 98.69
Total CHOL/HDL Ratio: 4
Triglycerides: 193 mg/dL — ABNORMAL HIGH (ref 0.0–149.0)
VLDL: 38.6 mg/dL (ref 0.0–40.0)

## 2022-06-01 LAB — CBC WITH DIFFERENTIAL/PLATELET
Basophils Absolute: 0 10*3/uL (ref 0.0–0.1)
Basophils Relative: 0.7 % (ref 0.0–3.0)
Eosinophils Absolute: 0.2 10*3/uL (ref 0.0–0.7)
Eosinophils Relative: 4.5 % (ref 0.0–5.0)
HCT: 42.6 % (ref 39.0–52.0)
Hemoglobin: 14.4 g/dL (ref 13.0–17.0)
Lymphocytes Relative: 19.8 % (ref 12.0–46.0)
Lymphs Abs: 1 10*3/uL (ref 0.7–4.0)
MCHC: 33.7 g/dL (ref 30.0–36.0)
MCV: 91.1 fl (ref 78.0–100.0)
Monocytes Absolute: 0.4 10*3/uL (ref 0.1–1.0)
Monocytes Relative: 8.2 % (ref 3.0–12.0)
Neutro Abs: 3.3 10*3/uL (ref 1.4–7.7)
Neutrophils Relative %: 66.8 % (ref 43.0–77.0)
Platelets: 209 10*3/uL (ref 150.0–400.0)
RBC: 4.68 Mil/uL (ref 4.22–5.81)
RDW: 13.5 % (ref 11.5–15.5)
WBC: 4.9 10*3/uL (ref 4.0–10.5)

## 2022-06-01 LAB — HEMOGLOBIN A1C: Hgb A1c MFr Bld: 6 % (ref 4.6–6.5)

## 2022-06-01 NOTE — Progress Notes (Signed)
Subjective:    Patient ID: Maurice Colon, male    DOB: 01-19-1933, 88 y.o.   MRN: [id number]  DOS:  06/01/2022 Type of visit - description: CPX  In general feeling well. BP noted to be elevated, he had a rough night, could not sleep, takes care of his daughter who is bipolar. Other than that denies chest pain, difficulty breathing. No headache or lower extremity edema. No lower extremity paresthesias  Review of Systems  Other than above, a 14 point review of systems is negative    Past Medical History:  Diagnosis Date   DJD (degenerative joint disease)    Hyperlipidemia    Hypertriglyceridemia   Hypertension    Hypogonadism, male    Osteoporosis    Prediabetes 07/16/2013   Rosacea     Past Surgical History:  Procedure Laterality Date   CATARACT EXTRACTION     Bilaterally   INGUINAL HERNIA REPAIR     Social History   Socioeconomic History   Marital status: Married    Spouse name: Not on file   Number of children: 2   Years of education: Not on file   Highest education level: Not on file  Occupational History   Occupation: retired    Associate Professor: RETIRED  Tobacco Use   Smoking status: Never   Smokeless tobacco: Never  Substance and Sexual Activity   Alcohol use: No    Comment: wine, occasionally   Drug use: Not on file   Sexual activity: Not on file  Other Topics Concern   Not on file  Social History Narrative   Original from [place]   Lives w/ wife and 1 daughter (bipolar)   Moved to [place] early 2008      Social Determinants of Health   Financial Resource Strain: Not on file  Food Insecurity: Not on file  Transportation Needs: Not on file  Physical Activity: Not on file  Stress: Not on file  Social Connections: Not on file  Intimate Partner Violence: Not on file    Current Outpatient Medications  Medication Instructions   atorvastatin (LIPITOR) 10 MG tablet TAKE 1 TABLET EVERY DAY   augmented betamethasone dipropionate  (DIPROLENE-AF) 0.05 % cream Topical, 2 times daily PRN   losartan-hydrochlorothiazide (HYZAAR) 100-25 MG tablet TAKE 1 TABLET EVERY DAY   meloxicam (MOBIC) 7.5-15 mg, Oral, Daily PRN   potassium chloride (KLOR-CON M) 10 MEQ tablet TAKE 1 TABLET EVERY DAY   terazosin (HYTRIN) 1 MG capsule TAKE 1 CAPSULE AT BEDTIME       Objective:   Physical Exam BP (!) 150/88   Pulse 78   Temp 97.8 F (36.6 C) (Oral)   Resp 18   Ht 5\' 7"  (1.702 m)   Wt 197 lb 3 oz (89.4 kg)   SpO2 98%   BMI 30.88 kg/m  General: Well developed, NAD, BMI noted Neck: No  thyromegaly  HEENT:  Normocephalic . Face symmetric, atraumatic Lungs:  CTA B Normal respiratory effort, no intercostal retractions, no accessory muscle use. Heart: RRR,  no murmur.  Abdomen:  Not distended, soft, non-tender. No rebound or rigidity.   DM foot exam: No edema, good pedal pulses, pinprick examination normal Skin: Exposed areas without rash. Not pale. Not jaundice Neurologic:  alert & oriented X3.  Speech normal, gait appropriate for age and unassisted Strength symmetric and appropriate for age.  Psych: Cognition and judgment appear intact.  Cooperative with normal attention span and concentration.  Behavior appropriate. No anxious or depressed  appearing.     Assessment     Assessment  Diabetes: tried metformin, see note 08-30-17, has s/e, ok to try again if needed (low dose) HTN: amlodipine: edema  Dyslipidemia,  high triglycerides Hypogonadism- was unable to afford HRT 2012, no sx  Osteoporosis: T score -2.4  (05-2012). T score -2.3 (05-2016), DEXA 01-2020, T score better at -1.9. Vitamin D deficiency DJD-- meloxicam prn Rosacea Increase LFTs -- First noted ~ 04-2015, no etoh/tylenol. changed simvastatin to Lipitor 04-2014 and Lipitor was d/c 2-17. We also d/c fenofibrates 09-2015; LFTs back to normal  01-2016  Fatty liver per [place] 07-18-2015 Hep B-C  Neg 07-2015;  ceruloplasmin, ferritin, iron and alpha-1 antitrypsin  ---> (-) 09-2015 EKG 2020, Wenckebach  PLAN: Here for CPX DM: Diet controlled, last A1c very good.  Feet exam negative.  Recheck A1c. HTN: On Hyzaar, Hytrin, potassium.  Ambulatory BPs are normal, BP today has been elevated, he had a very difficult time last night, could not sleep, his daughter is bipolar.  BP was rechecked before he left the office: 150/80.  Improved. Recommend to continue checking ambulatory BPs, call if not at goal. Dyslipidemia: On Lipitor, patient would like his FLP rechecked.  Will do. Osteoporosis: Last T score improved compared with 2 previous DEXA's.  Recommend calcium and vitamin D. DJD: Takes meloxicam rarely, GI precautions reviewed. RTC 4 to 5 months.  In addition to CPX, I addressed all his chronic medical problems.

## 2022-06-01 NOTE — Patient Instructions (Addendum)
Vaccines I recommend;  Covid booster RSV vaccine (2 weeks away from covid vaccine)   Check the  blood pressure regularly BP GOAL is between 110/65 and  135/85. If it is consistently higher or lower, let me know  Take over-the-counter vitamin D at least 2000 units daily.   GO TO THE LAB : Get the blood work     [place], [place] back for a checkup in 4 to 5 months    Do you have a "Living will" or "[place] of attorney"? (Advance care planning documents)  If you already have a living will or healthcare power of attorney, is recommended you bring the copy to be scanned in your chart. The document will be available to all the doctors you see in the system.  If you don't have one, please consider create one.  Advance care planning is a process that supports adults in  understanding and sharing their preferences regarding future medical care.   The patient's preferences are recorded in documents called Advance Directives and the can be modified at any time while the patient is in full mental capacity.   The documentation should be available at all times to the patient, the family and the healthcare providers.   This legal documents direct the family or surrogate to make the decision if the patient is not capable to do so.    Advance directives can be documented in many types of formats,  documents have names such as:  Lliving will  Durable power of attorney for healthcare (healthcare proxy or healthcare power of attorney)  Combined directives  Physician orders for life-sustaining treatment    More information at:  [email or web address]

## 2022-06-01 NOTE — Assessment & Plan Note (Signed)
Here for CPX DM: Diet controlled, last A1c very good.  Feet exam negative.  Recheck A1c. HTN: On Hyzaar, Hytrin, potassium.  Ambulatory BPs are normal, BP today has been elevated, he had a very difficult time last night, could not sleep, his daughter is bipolar.  BP was rechecked before he left the office: 150/80.  Improved. Recommend to continue checking ambulatory BPs, call if not at goal. Dyslipidemia: On Lipitor, patient would like his FLP rechecked.  Will do. Osteoporosis: Last T score improved compared with 2 previous DEXA's.  Recommend calcium and vitamin D. DJD: Takes meloxicam rarely, GI precautions reviewed. RTC 4 to 5 months.

## 2022-06-01 NOTE — Patient Instructions (Signed)
[name] , Thank you for taking time to come for your Medicare Wellness Visit. I appreciate your ongoing commitment to your health goals. Please review the following plan we discussed and let me know if I can assist you in the future.   These are the goals we discussed:  Goals   None     This is a list of the screening recommended for you and due dates:  Health Maintenance  Topic Date Due   COVID-19 Vaccine (6 - Pfizer risk series) 08/15/2021   Complete foot exam   04/15/2022   Eye exam for diabetics  07/13/2022   Hemoglobin A1C  07/22/2022   Medicare Annual Wellness Visit  06/02/2023   Tetanus Vaccine  05/06/2031   Pneumonia Vaccine  Completed   Flu Shot  Completed   Zoster (Shingles) Vaccine  Completed   HPV Vaccine  Aged Out     Next appointment: Follow up in one year for your annual wellness visit.   Preventive Care 86 Years and Older, Male Preventive care refers to lifestyle choices and visits with your health care provider that can promote health and wellness. What does preventive care include? A yearly physical exam. This is also called an annual well check. Dental exams once or twice a year. Routine eye exams. Ask your health care provider how often you should have your eyes checked. Personal lifestyle choices, including: Daily care of your teeth and gums. Regular physical activity. Eating a healthy diet. Avoiding tobacco and drug use. Limiting alcohol use. Practicing safe sex. Taking low doses of aspirin every day. Taking vitamin and mineral supplements as recommended by your health care provider. What happens during an annual well check? The services and screenings done by your health care provider during your annual well check will depend on your age, overall health, lifestyle risk factors, and family history of disease. Counseling  Your health care provider may ask you questions about your: Alcohol use. Tobacco use. Drug use. Emotional  well-being. Home and relationship well-being. Sexual activity. Eating habits. History of falls. Memory and ability to understand (cognition). Work and work Statistician. Screening  You may have the following tests or measurements: Height, weight, and BMI. Blood pressure. Lipid and cholesterol levels. These may be checked every 5 years, or more frequently if you are over 86 years old. Skin check. Lung cancer screening. You may have this screening every year starting at age 86 if you have a 30-pack-year history of smoking and currently smoke or have quit within the past 15 years. Fecal occult blood test (FOBT) of the stool. You may have this test every year starting at age 86. Flexible sigmoidoscopy or colonoscopy. You may have a sigmoidoscopy every 5 years or a colonoscopy every 10 years starting at age 65. Prostate cancer screening. Recommendations will vary depending on your family history and other risks. Hepatitis C blood test. Hepatitis B blood test. Sexually transmitted disease (STD) testing. Diabetes screening. This is done by checking your blood sugar (glucose) after you have not eaten for a while (fasting). You may have this done every 1-3 years. Abdominal aortic aneurysm (AAA) screening. You may need this if you are a current or former smoker. Osteoporosis. You may be screened starting at age 67 if you are at high risk. Talk with your health care provider about your test results, treatment options, and if necessary, the need for more tests. Vaccines  Your health care provider may recommend certain vaccines, such as: Influenza vaccine. This is recommended  every year. Tetanus, diphtheria, and acellular pertussis (Tdap, Td) vaccine. You may need a Td booster every 10 years. Zoster vaccine. You may need this after age 70. Pneumococcal 13-valent conjugate (PCV13) vaccine. One dose is recommended after age 86. Pneumococcal polysaccharide (PPSV23) vaccine. One dose is recommended after  age 86. Talk to your health care provider about which screenings and vaccines you need and how often you need them. This information is not intended to replace advice given to you by your health care provider. Make sure you discuss any questions you have with your health care provider. Document Released: 08/15/2015 Document Revised: 04/07/2016 Document Reviewed: 05/20/2015 Elsevier Interactive Patient Education  2017 [employer] Prevention in the Home Falls can cause injuries. They can happen to people of all ages. There are many things you can do to make your home safe and to help prevent falls. What can I do on the outside of my home? Regularly fix the edges of walkways and driveways and fix any cracks. Remove anything that might make you trip as you walk through a door, such as a raised step or threshold. Trim any bushes or trees on the path to your home. Use bright outdoor lighting. Clear any walking paths of anything that might make someone trip, such as rocks or tools. Regularly check to see if handrails are loose or broken. Make sure that both sides of any steps have handrails. Any raised decks and porches should have guardrails on the edges. Have any leaves, snow, or ice cleared regularly. Use sand or salt on walking paths during winter. Clean up any spills in your garage right away. This includes oil or grease spills. What can I do in the bathroom? Use night lights. Install grab bars by the toilet and in the tub and shower. Do not use towel bars as grab bars. Use non-skid mats or decals in the tub or shower. If you need to sit down in the shower, use a plastic, non-slip stool. Keep the floor dry. Clean up any water that spills on the floor as soon as it happens. Remove soap buildup in the tub or shower regularly. Attach bath mats securely with double-sided non-slip rug tape. Do not have throw rugs and other things on the floor that can make you trip. What can I do in the  bedroom? Use night lights. Make sure that you have a light by your bed that is easy to reach. Do not use any sheets or blankets that are too big for your bed. They should not hang down onto the floor. Have a firm chair that has side arms. You can use this for support while you get dressed. Do not have throw rugs and other things on the floor that can make you trip. What can I do in the kitchen? Clean up any spills right away. Avoid walking on wet floors. Keep items that you use a lot in easy-to-reach places. If you need to reach something above you, use a strong step stool that has a grab bar. Keep electrical cords out of the way. Do not use floor polish or wax that makes floors slippery. If you must use wax, use non-skid floor wax. Do not have throw rugs and other things on the floor that can make you trip. What can I do with my stairs? Do not leave any items on the stairs. Make sure that there are handrails on both sides of the stairs and use them. Fix handrails that are broken  or loose. Make sure that handrails are as long as the stairways. Check any carpeting to make sure that it is firmly attached to the stairs. Fix any carpet that is loose or worn. Avoid having throw rugs at the top or bottom of the stairs. If you do have throw rugs, attach them to the floor with carpet tape. Make sure that you have a light switch at the top of the stairs and the bottom of the stairs. If you do not have them, ask someone to add them for you. What else can I do to help prevent falls? Wear shoes that: Do not have high heels. Have rubber bottoms. Are comfortable and fit you well. Are closed at the toe. Do not wear sandals. If you use a stepladder: Make sure that it is fully opened. Do not climb a closed stepladder. Make sure that both sides of the stepladder are locked into place. Ask someone to hold it for you, if possible. Clearly mark and make sure that you can see: Any grab bars or  handrails. First and last steps. Where the edge of each step is. Use tools that help you move around (mobility aids) if they are needed. These include: Canes. Walkers. Scooters. Crutches. Turn on the lights when you go into a dark area. Replace any light bulbs as soon as they burn out. Set up your furniture so you have a clear path. Avoid moving your furniture around. If any of your floors are uneven, fix them. If there are any pets around you, be aware of where they are. Review your medicines with your doctor. Some medicines can make you feel dizzy. This can increase your chance of falling. Ask your doctor what other things that you can do to help prevent falls. This information is not intended to replace advice given to you by your health care provider. Make sure you discuss any questions you have with your health care provider. Document Released: 05/15/2009 Document Revised: 12/25/2015 Document Reviewed: 08/23/2014 Elsevier Interactive Patient Education  2017 [employer].

## 2022-06-01 NOTE — Progress Notes (Addendum)
Subjective:   Maurice Colon is a 88 y.o. male who presents for Medicare Annual/Subsequent preventive examination.  Review of Systems    Defer to PCP Cardiac Risk Factors include: advanced age (>14men, >79 women);dyslipidemia;diabetes mellitus;hypertension     Objective:    Today's Vitals   06/01/22 0921  BP: (!) 179/65  Pulse: 61  Weight: 197 lb 3.2 oz (89.4 kg)  Height: 5\' 7"  (1.702 m)   Body mass index is 30.89 kg/m.     06/01/2022    9:25 AM 12/19/2019   10:17 AM  Advanced Directives  Does Patient Have a Medical Advance Directive? No No  Would patient like information on creating a medical advance directive? No - Patient declined No - Patient declined    Current Medications (verified) Outpatient Encounter Medications as of 06/01/2022  Medication Sig   atorvastatin (LIPITOR) 10 MG tablet TAKE 1 TABLET EVERY DAY   augmented betamethasone dipropionate (DIPROLENE-AF) 0.05 % cream Apply topically 2 (two) times daily as needed.   losartan-hydrochlorothiazide (HYZAAR) 100-25 MG tablet TAKE 1 TABLET EVERY DAY   meloxicam (MOBIC) 7.5 MG tablet Take 1-2 tablets (7.5-15 mg total) by mouth daily as needed for pain.   potassium chloride (KLOR-CON M) 10 MEQ tablet TAKE 1 TABLET EVERY DAY   terazosin (HYTRIN) 1 MG capsule TAKE 1 CAPSULE AT BEDTIME   No facility-administered encounter medications on file as of 06/01/2022.    Allergies (verified) Ace inhibitors, Amlodipine besylate, and Metformin and related   History: Past Medical History:  Diagnosis Date   DJD (degenerative joint disease)    Hyperlipidemia    Hypertriglyceridemia   Hypertension    Hypogonadism, male    Osteoporosis    Prediabetes 07/16/2013   Rosacea    Past Surgical History:  Procedure Laterality Date   CATARACT EXTRACTION     Bilaterally   INGUINAL HERNIA REPAIR     Family History  Problem Relation Age of Onset   Diabetes Mother    Hypertension Mother    Heart attack Father         age 64   Cancer Neg Hx    Social History   Socioeconomic History   Marital status: Married    Spouse name: Not on file   Number of children: 2   Years of education: Not on file   Highest education level: Not on file  Occupational History   Occupation: retired    Fish farm manager: RETIRED  Tobacco Use   Smoking status: Never   Smokeless tobacco: Never  Substance and Sexual Activity   Alcohol use: No    Comment: wine, occasionally   Drug use: Not on file   Sexual activity: Not on file  Other Topics Concern   Not on file  Social History Narrative   Original from [place]   Lives w/ wife and 1 daughter (bipolar)   Moved to [place] early 2008      Social Determinants of Health   Financial Resource Strain: Not on file  Food Insecurity: Not on file  Transportation Needs: Not on file  Physical Activity: Not on file  Stress: Not on file  Social Connections: Not on file    Tobacco Counseling Counseling given: Not Answered   Clinical Intake:  Pre-visit preparation completed: Yes  Pain : No/denies pain  How often do you need to have someone help you when you read instructions, pamphlets, or other written materials from your doctor or pharmacy?: 1 - Never  Diabetic? Yes Nutrition Risk  Assessment:  Has the patient had any N/V/D within the last 2 months?  No  Does the patient have any non-healing wounds?  No  Has the patient had any unintentional weight loss or weight gain?  No   Diabetes:  Is the patient diabetic?  Yes  If diabetic, was a CBG obtained today?  No  Did the patient bring in their glucometer from home?  No  How often do you monitor your CBG's? Never.   Financial Strains and Diabetes Management:  Are you having any financial strains with the device, your supplies or your medication? No .  Does the patient want to be seen by Chronic Care Management for management of their diabetes?  No  Would the patient like to be referred to a Nutritionist or for  Diabetic Management?  No   Diabetic Exams:  Diabetic Eye Exam: Completed 07/13/21 Diabetic Foot Exam: Overdue, Pt has been advised about the importance in completing this exam. Pt is scheduled for diabetic foot exam on N/a.  Interpreter Needed?: Yes Interpreter Name: Synetta Fail, daughter  Information entered by :: [employer]   Activities of Daily Living    06/01/2022    9:25 AM  In your present state of health, do you have any difficulty performing the following activities:  Hearing? 0  Vision? 0  Difficulty concentrating or making decisions? 0  Walking or climbing stairs? 0  Dressing or bathing? 0  Doing errands, shopping? 0  Preparing Food and eating ? N  Using the Toilet? N  In the past six months, have you accidently leaked urine? N  Do you have problems with loss of bowel control? N  Managing your Medications? N  Managing your Finances? N  Housekeeping or managing your Housekeeping? N    Patient Care Team: Colon Branch, MD as PCP - General Mahala Menghini, MD as Referring Physician (Ophthalmology) Day, Melvenia Beam, [facility] (Inactive) as Pharmacist (Pharmacist)  Indicate any recent Medical Services you may have received from other than [facility] providers in the past year (date may be approximate).     Assessment:   This is a routine wellness examination for [name].  Hearing/Vision screen No results found.  Dietary issues and exercise activities discussed: Current Exercise Habits: Home exercise routine, Type of exercise: stretching, Time (Minutes): 15, Frequency (Times/Week): 3, Weekly Exercise (Minutes/Week): 45, Exercise limited by: None identified   Goals Addressed   None    Depression Screen    06/01/2022    9:25 AM 01/20/2022   10:46 AM 08/14/2021    1:07 PM 04/15/2021    1:52 PM 12/04/2020   10:43 AM 11/23/2019   11:17 AM 08/18/2018    1:04 PM  PHQ 2/9 Scores  PHQ - 2 Score 0 0 0 0 0 0 0  PHQ- 9 Score       0    Fall Risk    06/01/2022    9:25 AM  01/20/2022   10:46 AM 08/14/2021    1:07 PM 04/15/2021    1:52 PM 12/04/2020   10:43 AM  [place] in the past year? 0 0 0 0 0  Number falls in past yr: 0 0 0 0 0  Injury with Fall? 0 0 0 0 0  Risk for fall due to : No Fall Risks      Follow up Falls evaluation completed Falls evaluation completed Falls evaluation completed Falls evaluation completed Falls evaluation completed    FALL RISK PREVENTION PERTAINING TO  THE HOME:  Any stairs in or around the home? No  If so, are there any without handrails?  No stairs Home free of loose throw rugs in walkways, pet beds, electrical cords, etc? Yes  Adequate lighting in your home to reduce risk of falls? Yes   ASSISTIVE DEVICES UTILIZED TO PREVENT FALLS:  Life alert? No  Use of a cane, walker or w/c? No  Grab bars in the bathroom? No  Shower chair or bench in shower? No  Elevated toilet seat or a handicapped toilet? No   TIMED UP AND GO:  Was the test performed? Yes .  Length of time to ambulate 10 feet: 7 sec.   Gait slow and steady without use of assistive device  Cognitive Function:    06/01/2022    9:28 AM  MMSE - Mini Mental State Exam  Not completed: Refused        Immunizations Immunization History  Administered Date(s) Administered   Fluad Quad(high Dose 65+) 03/30/2019, 06/20/2020, 04/15/2021, 06/01/2022   Influenza Whole 05/24/2007, 06/06/2009, 03/31/2010   Influenza, High Dose Seasonal PF 07/16/2013, 04/16/2015, 05/14/2016, 06/17/2017, 08/18/2018   Influenza,inj,Quad PF,6+ Mos 05/01/2014   PFIZER Comirnaty(Gray Top)Covid-19 Tri-Sucrose Vaccine 12/04/2020   PFIZER(Purple Top)SARS-COV-2 Vaccination 10/20/2019, 11/08/2019, 06/19/2020   Pfizer Covid-19 Vaccine Bivalent Booster 63yrs & up 06/20/2021   Pneumococcal Conjugate-13 09/19/2014   Pneumococcal Polysaccharide-23 05/24/2007, 04/17/2018   Td 05/18/2010   Tdap 05/05/2021   Zoster Recombinat (Shingrix) 03/11/2020, 05/05/2021   Zoster, Live 08/03/2015     TDAP status: Up to date  Flu Vaccine status: Completed at today's visit  Pneumococcal vaccine status: Up to date  Covid-19 vaccine status: Information provided on how to obtain vaccines.   Qualifies for Shingles Vaccine? Yes   Zostavax completed Yes   Shingrix Completed?: Yes  Screening Tests Health Maintenance  Topic Date Due   Medicare Annual Wellness (AWV)  Never done   COVID-19 Vaccine (6 - Pfizer risk series) 08/15/2021   FOOT EXAM  04/15/2022   OPHTHALMOLOGY EXAM  07/13/2022   HEMOGLOBIN A1C  07/22/2022   TETANUS/TDAP  05/06/2031   Pneumonia Vaccine 66+ Years old  Completed   INFLUENZA VACCINE  Completed   Zoster Vaccines- Shingrix  Completed   HPV VACCINES  Aged Out    Health Maintenance  Health Maintenance Due  Topic Date Due   Medicare Annual Wellness (AWV)  Never done   COVID-19 Vaccine (6 - Pfizer risk series) 08/15/2021   FOOT EXAM  04/15/2022    Colorectal cancer screening: No longer required.   Lung Cancer Screening: (Low Dose CT Chest recommended if Age 59-80 years, 30 pack-year currently smoking OR have quit w/in 15years.) does not qualify.   Lung Cancer Screening Referral: N/a  Additional Screening:  Hepatitis C Screening: does not qualify  Vision Screening: Recommended annual ophthalmology exams for early detection of glaucoma and other disorders of the eye. Is the patient up to date with their annual eye exam?  Yes  Who is the provider or what is the name of the office in which the patient attends annual eye exams? [facility] If pt is not established with a provider, would they like to be referred to a provider to establish care? No .   Dental Screening: Recommended annual dental exams for proper oral hygiene  Community Resource Referral / Chronic Care Management: CRR required this visit?  No   CCM required this visit?  No      Plan:  I have personally reviewed and noted the following in the patient's chart:   Medical  and social history Use of alcohol, tobacco or illicit drugs  Current medications and supplements including opioid prescriptions. Patient is not currently taking opioid prescriptions. Functional ability and status Nutritional status Physical activity Advanced directives List of other physicians Hospitalizations, surgeries, and ER visits in previous 12 months Vitals Screenings to include cognitive, depression, and falls Referrals and appointments  In addition, I have reviewed and discussed with patient certain preventive protocols, quality metrics, and best practice recommendations. A written personalized care plan for preventive services as well as general preventive health recommendations were provided to patient.     Beatris Ship, [place]   06/01/2022   Nurse Notes: None  I have reviewed and agree with [facility] documentation.  Kathlene November, MD

## 2022-06-01 NOTE — Assessment & Plan Note (Signed)
-  Td: 23 22 - PNM 23: 2008, 2019; prevnar: 2016 - s/p zostavax, no documentation; s/p  shingrex x2 - RSV discussed - s/p covid vax: Booster discussed - Had a flu shot -CCS: Colonoscopy 12-11,  (-), no further CCS - prostate ca screening: no further screening   -Labs:  see orders - Diet ,exercise d/w pt   -Healthcare POA: Information provided

## 2022-06-09 ENCOUNTER — Other Ambulatory Visit: Payer: Self-pay | Admitting: Internal Medicine

## 2022-06-09 DIAGNOSIS — E785 Hyperlipidemia, unspecified: Secondary | ICD-10-CM

## 2022-06-21 ENCOUNTER — Encounter: Payer: Self-pay | Admitting: Internal Medicine

## 2022-07-19 DIAGNOSIS — H353131 Nonexudative age-related macular degeneration, bilateral, early dry stage: Secondary | ICD-10-CM | POA: Diagnosis not present

## 2022-07-19 DIAGNOSIS — H04123 Dry eye syndrome of bilateral lacrimal glands: Secondary | ICD-10-CM | POA: Diagnosis not present

## 2022-07-19 LAB — OPHTHALMONOLOGY REPORT-SCANNED

## 2022-10-28 ENCOUNTER — Encounter: Payer: Self-pay | Admitting: Internal Medicine

## 2022-11-01 ENCOUNTER — Encounter: Payer: Self-pay | Admitting: Internal Medicine

## 2022-11-01 ENCOUNTER — Ambulatory Visit (INDEPENDENT_AMBULATORY_CARE_PROVIDER_SITE_OTHER): Payer: Medicare HMO | Admitting: Internal Medicine

## 2022-11-01 VITALS — BP 122/68 | HR 40 | Temp 98.3°F | Resp 16 | Ht 67.0 in | Wt 198.0 lb

## 2022-11-01 DIAGNOSIS — R001 Bradycardia, unspecified: Secondary | ICD-10-CM | POA: Diagnosis not present

## 2022-11-01 DIAGNOSIS — F439 Reaction to severe stress, unspecified: Secondary | ICD-10-CM | POA: Diagnosis not present

## 2022-11-01 DIAGNOSIS — I10 Essential (primary) hypertension: Secondary | ICD-10-CM

## 2022-11-01 NOTE — Assessment & Plan Note (Signed)
DM: A1c was elevated one time, since then is essentially normal. HTN: BP today is very good, continue Hyzaar, potassium, terazosin.  Check BMP Bradycardia: Marked bradycardia documented by EKG.  No symptoms, no new medicines, no unusual OTCs.  Etiology unclear, plan: TSH, cardiology referral, r/o brady arrhythmia Dyslipidemia: On Lipitor, last FLP very good. Stress: Patient noted to be stressed, he eventually opened up, daughter has bipolar and life at home is extremely difficult.  Often times he only sleeps 2 hours at night.  Listening therapy provided, offered medication, patient will think about it. RTC 4 months

## 2022-11-01 NOTE — Progress Notes (Signed)
   Subjective:    Patient ID: Maurice Colon, male    DOB: 02/16/1933, 89 y.o.   MRN: [id number]  DOS:  11/01/2022 Type of visit - description: f/u  In general reports he is doing well. He was noted to be stressed out, he takes care of his daughter who is bipolar. Bradycardia noted, denies any dizziness or weakness.  Review of Systems See above   Past Medical History:  Diagnosis Date   DJD (degenerative joint disease)    Hyperlipidemia    Hypertriglyceridemia   Hypertension    Hypogonadism, male    Osteoporosis    Prediabetes 07/16/2013   Rosacea     Past Surgical History:  Procedure Laterality Date   CATARACT EXTRACTION     Bilaterally   INGUINAL HERNIA REPAIR      Current Outpatient Medications  Medication Instructions   atorvastatin (LIPITOR) 10 mg, Oral, Daily   augmented betamethasone dipropionate (DIPROLENE-AF) 0.05 % cream Topical, 2 times daily PRN   losartan-hydrochlorothiazide (HYZAAR) 100-25 MG tablet 1 tablet, Oral, Daily   meloxicam (MOBIC) 7.5-15 mg, Oral, Daily PRN   potassium chloride (KLOR-CON M) 10 MEQ tablet 10 mEq, Oral, Daily   terazosin (HYTRIN) 1 MG capsule TAKE 1 CAPSULE AT BEDTIME       Objective:   Physical Exam BP 122/68   Pulse (!) 40   Temp 98.3 F (36.8 C) (Oral)   Resp 16   Ht 5\' 7"  (1.702 m)   Wt 198 lb (89.8 kg)   SpO2 93%   BMI 31.01 kg/m  General:   Well developed, NAD, BMI noted. HEENT:  Normocephalic . Face symmetric, atraumatic Lungs:  CTA B Normal respiratory effort, no intercostal retractions, no accessory muscle use. Heart: Bradycardic Lower extremities: no pretibial edema bilaterally  Skin: Not pale. Not jaundice Neurologic:  alert & oriented X3.  Speech normal, gait appropriate for age and unassisted Psych--  Cognition and judgment appear intact.  Cooperative with normal attention span and concentration.  Behavior appropriate. Becomes tearful when we talk about his daughter and the situation at  home     Assessment     Assessment  Diabetes: tried metformin, see note 08-30-17, has s/e, ok to try again if needed (low dose) HTN: amlodipine: edema  Dyslipidemia,  high triglycerides Hypogonadism- was unable to afford HRT 2012, no sx  Osteoporosis: T score -2.4  (05-2012). T score -2.3 (05-2016), DEXA 01-2020, T score better at -1.9. Vitamin D deficiency DJD-- meloxicam prn Rosacea Increase LFTs -- First noted ~ 04-2015, no etoh/tylenol. changed simvastatin to Lipitor 04-2014 and Lipitor was d/c 2-17. We also d/c fenofibrates 09-2015; LFTs back to normal  01-2016  Fatty liver per [place] 07-18-2015 Hep B-C  Neg 07-2015;  ceruloplasmin, ferritin, iron and alpha-1 antitrypsin ---> (-) 09-2015 EKG 2020, [place]enckebach  PLAN: DM: A1c was elevated one time, since then is essentially normal. HTN: BP today is very good, continue Hyzaar, potassium, terazosin.  Check BMP Bradycardia: Marked bradycardia documented by EKG.  No symptoms, no new medicines, no unusual OTCs.  Etiology unclear, plan: TSH, cardiology referral, r/o brady arrhythmia Dyslipidemia: On Lipitor, last FLP very good. Stress: Patient noted to be stressed, he eventually opened up, daughter has bipolar and life at home is extremely difficult.  Often times he only sleeps 2 hours at night.  Listening therapy provided, offered medication, patient will think about it. RTC 4 months

## 2022-11-01 NOTE — Patient Instructions (Addendum)
I am referring you to the cardiologist because your heart rate is very low.  Check the  blood pressure regularly If possible check your heart rate BP GOAL is between 110/65 and  135/85. If it is consistently higher or lower, let me know     GO TO THE LAB : Get the blood work     [place], [place] back for   a checkup in 4 to 5 months   Vaccines I recommend: Covid booster RSV vaccine  Aparentemente ya es tiempo de hacerse un examen de los ojos. Por favor llame a su doctor de los ojos (ophthalmologist, optometrist) y saque una cita. Solicite que nos envien una copia de la visita a nuestro fax: [phone]. Si necesitara un "referral" nosotros lo podemos hacer

## 2022-11-02 LAB — BASIC METABOLIC PANEL
BUN: 18 mg/dL (ref 6–23)
CO2: 27 mEq/L (ref 19–32)
Calcium: 9.4 mg/dL (ref 8.4–10.5)
Chloride: 102 mEq/L (ref 96–112)
Creatinine, Ser: 1.03 mg/dL (ref 0.40–1.50)
GFR: 65.31 mL/min (ref >60.00)
Glucose, Bld: 87 mg/dL (ref 70–99)
Potassium: 3.9 mEq/L (ref 3.5–5.1)
Sodium: 137 mEq/L (ref 135–145)

## 2022-11-02 LAB — TSH: TSH: 2.39 u[IU]/mL (ref 0.35–5.50)

## 2022-11-11 ENCOUNTER — Other Ambulatory Visit: Payer: Self-pay | Admitting: Internal Medicine

## 2022-11-25 ENCOUNTER — Ambulatory Visit: Payer: Medicare HMO | Attending: Cardiology | Admitting: Cardiology

## 2022-11-25 ENCOUNTER — Encounter: Payer: Self-pay | Admitting: Cardiology

## 2022-11-25 VITALS — BP 172/70 | HR 68 | Ht 67.0 in | Wt 198.0 lb

## 2022-11-25 DIAGNOSIS — R9431 Abnormal electrocardiogram [ECG] [EKG]: Secondary | ICD-10-CM | POA: Diagnosis not present

## 2022-11-25 DIAGNOSIS — I441 Atrioventricular block, second degree: Secondary | ICD-10-CM | POA: Diagnosis not present

## 2022-11-25 NOTE — Patient Instructions (Signed)
Medication Instructions:  The current medical regimen is effective;  continue present plan and medications.  *If you need a refill on your cardiac medications before your next appointment, please call your pharmacy*  Testing/Procedures: Your physician has requested that you have an echocardiogram. Echocardiography is a painless test that uses sound waves to create images of your heart. It provides your doctor with information about the size and shape of your heart and how well your heart's chambers and valves are working. This procedure takes approximately one hour. There are no restrictions for this procedure. Please do NOT wear cologne, perfume, aftershave, or lotions (deodorant is allowed). Please arrive 15 minutes prior to your appointment time.  You have been referred to electrophysiology to discuss pacermaker placement.  Follow-Up: At [facility], you and your health needs are our priority.  As part of our continuing mission to provide you with exceptional heart care, we have created designated Provider Care Teams.  These Care Teams include your primary Cardiologist (physician) and Advanced Practice Providers (APPs -  Physician Assistants and Nurse Practitioners) who all work together to provide you with the care you need, when you need it.  We recommend signing up for the patient portal called "MyChart".  Sign up information is provided on this After Visit Summary.  MyChart is used to connect with patients for Virtual Visits (Telemedicine).  Patients are able to view lab/test results, encounter notes, upcoming appointments, etc.  Non-urgent messages can be sent to your provider as well.   To learn more about what you can do with MyChart, go to [email or web address].    Your next appointment:   Follow up with Dr Anne Fu as needed.

## 2022-11-25 NOTE — Progress Notes (Signed)
Cardiology Office Note:    Date:  11/25/2022   ID:  Maurice Colon [birth year], MRN [id number]  PCP:  Wanda Plump, MD   [facility] Providers Cardiologist:  None     Referring MD: Wanda Plump, MD    History of Present Illness:    Maurice Colon is a 89 y.o. male here for the evaluation of bradycardia at the request of Dr. Drue Novel.  No symptoms of chest pain syncope weakness.  Heart rate was 40 bpm at primary care doctor's office. ECG with second degree HB type 1 as well as 2:1 AVB.  Thyroid stimulating hormone is normal.  He has had under increased stress, daughter bipolar  Sometimes SOB with activity.   Brother and sister with pacer.     Past Medical History:  Diagnosis Date   Bradycardia    DJD (degenerative joint disease)    Hyperlipidemia    Hypertriglyceridemia   Hypertension    Hypogonadism, male    Osteoporosis    Prediabetes 07/16/2013   Rosacea     Past Surgical History:  Procedure Laterality Date   CATARACT EXTRACTION     Bilaterally   INGUINAL HERNIA REPAIR      Current Medications: Current Meds  Medication Sig   atorvastatin (LIPITOR) 10 MG tablet Take 1 tablet (10 mg total) by mouth daily.   augmented betamethasone dipropionate (DIPROLENE-AF) 0.05 % cream Apply topically 2 (two) times daily as needed.   losartan-hydrochlorothiazide (HYZAAR) 100-25 MG tablet Take 1 tablet by mouth daily.   meloxicam (MOBIC) 7.5 MG tablet Take 1-2 tablets (7.5-15 mg total) by mouth daily as needed for pain.   potassium chloride (KLOR-CON M) 10 MEQ tablet Take 1 tablet (10 mEq total) by mouth daily.   terazosin (HYTRIN) 1 MG capsule Take 1 capsule (1 mg total) by mouth at bedtime.     Allergies:   Ace inhibitors, Amlodipine besylate, and Metformin and related   Social History   Socioeconomic History   Marital status: Married    Spouse name: Not on file   Number of children: 2   Years of education: Not on file   Highest  education level: Not on file  Occupational History   Occupation: retired    Associate Professor: RETIRED  Tobacco Use   Smoking status: Never   Smokeless tobacco: Never  Substance and Sexual Activity   Alcohol use: No    Comment: wine, occasionally   Drug use: Not on file   Sexual activity: Not on file  Other Topics Concern   Not on file  Social History Narrative   Original from [place]   Lives w/ wife and 1 daughter (bipolar)   Moved to [place] early 2008      Social Determinants of Health   Financial Resource Strain: Not on file  Food Insecurity: Not on file  Transportation Needs: Not on file  Physical Activity: Not on file  Stress: Not on file  Social Connections: Not on file     Family History: The patient's family history includes Diabetes in his mother; Heart attack in his father; Hypertension in his mother. There is no history of Cancer.  ROS:   Please see the history of present illness.    No fevers chills nausea vomiting syncope all other systems reviewed and are negative.  EKGs/Labs/Other Studies Reviewed:    The following studies were reviewed today:  Prior office notes reviewed, lab work reviewed   EKG:  see below  Recent Labs: 06/01/2022: ALT 23; Hemoglobin 14.4; Platelets 209.0 11/01/2022: BUN 18; Creatinine, Ser 1.03; Potassium 3.9; Sodium 137; TSH 2.39  Recent Lipid Panel    Component Value Date/Time   CHOL 132 06/01/2022 1031   TRIG 193.0 (H) 06/01/2022 1031   HDL 33.80 (L) 06/01/2022 1031   CHOLHDL 4 06/01/2022 1031   VLDL 38.6 06/01/2022 1031   LDLCALC 60 06/01/2022 1031   LDLDIRECT 63.0 01/20/2022 1101     Risk Assessment/Calculations:   100       Physical Exam:    VS:  BP (!) 172/70   Pulse 68   Ht  (1.702 m)   Wt 198 lb (89.8 kg)   SpO2 95%   BMI 31.01 kg/m     Wt Readings from Last 3 Encounters:  11/25/22 198 lb (89.8 kg)  11/01/22 198 lb (89.8 kg)  06/01/22 197 lb 3 oz (89.4 kg)     GEN: Very-year-old Well nourished,  well developed in no acute distress HEENT: Normal NECK: No JVD; No carotid bruits LYMPHATICS: No lymphadenopathy CARDIAC:  bradycardic, no murmurs, rubs, gallops RESPIRATORY:  Clear to auscultation without rales, wheezing or rhonchi  ABDOMEN: Soft, non-tender, non-distended MUSCULOSKELETAL:  No edema; No deformity  SKIN: Warm and dry NEUROLOGIC:  Alert and oriented x 3 PSYCHIATRIC:  Normal affect   ASSESSMENT:    1. Nonspecific abnormal electrocardiogram (ECG) (EKG)   2. AV block, 2nd degree    PLAN:    In order of problems listed above:  High degree AV block -Both Wenkebach as well as 2-1 AV block noted on ECG with heart rate of 40.  Evidence of conduction disorder.  AV conduction disorder.  We are going to check an echocardiogram.  Mild shortness of breath at times with activity.  Today during exam heart rate at times was in the mid 60s but then during auscultation would drop down into the 40 range.  This was likely mimicking his EKG.  Interestingly both his brother and sister slightly older than him have pacemakers.  We will refer to electrophysiology for pacemaker implantation.  Avoid AV nodal blocking agents.  Discussed with help of translator.  He understands pacemaker.  Wife present for discussion.          Medication Adjustments/Labs and Tests Ordered: Current medicines are reviewed at length with the patient today.  Concerns regarding medicines are outlined above.  Orders Placed This Encounter  Procedures   Ambulatory referral to Cardiac Electrophysiology   ECHOCARDIOGRAM COMPLETE   No orders of the defined types were placed in this encounter.   Patient Instructions  Medication Instructions:  The current medical regimen is effective;  continue present plan and medications.  *If you need a refill on your cardiac medications before your next appointment, please call your pharmacy*  Testing/Procedures: Your physician has requested that you have an  echocardiogram. Echocardiography is a painless test that uses sound waves to create images of your heart. It provides your doctor with information about the size and shape of your heart and how well your heart's chambers and valves are working. This procedure takes approximately one hour. There are no restrictions for this procedure. Please do NOT wear cologne, perfume, aftershave, or lotions (deodorant is allowed). Please arrive 15 minutes prior to your appointment time.  You have been referred to electrophysiology to discuss pacermaker placement.  Follow-Up: At [facility], you and your health needs are our priority.  As part of our continuing mission to provide you  with exceptional heart care, we have created designated Provider Care Teams.  These Care Teams include your primary Cardiologist (physician) and Advanced Practice Providers (APPs -  Physician Assistants and Nurse Practitioners) who all work together to provide you with the care you need, when you need it.  We recommend signing up for the patient portal called "MyChart".  Sign up information is provided on this After Visit Summary.  MyChart is used to connect with patients for Virtual Visits (Telemedicine).  Patients are able to view lab/test results, encounter notes, upcoming appointments, etc.  Non-urgent messages can be sent to your provider as well.   To learn more about what you can do with MyChart, go to [email or web address].    Your next appointment:   Follow up with Dr Anne Fu as needed.     Signed, Donato Schultz, MD  11/25/2022 12:26 PM    [facility]

## 2022-12-04 ENCOUNTER — Other Ambulatory Visit: Payer: Self-pay | Admitting: Internal Medicine

## 2022-12-04 DIAGNOSIS — E785 Hyperlipidemia, unspecified: Secondary | ICD-10-CM

## 2022-12-22 ENCOUNTER — Ambulatory Visit (HOSPITAL_COMMUNITY): Payer: Medicare HMO | Attending: Cardiology

## 2022-12-22 DIAGNOSIS — R9431 Abnormal electrocardiogram [ECG] [EKG]: Secondary | ICD-10-CM

## 2022-12-22 DIAGNOSIS — I441 Atrioventricular block, second degree: Secondary | ICD-10-CM | POA: Diagnosis not present

## 2022-12-22 LAB — ECHOCARDIOGRAM COMPLETE
Area-P 1/2: 2.56 cm2
P 1/2 time: 539 msec
S' Lateral: 2.8 cm

## 2022-12-31 ENCOUNTER — Ambulatory Visit: Payer: Medicare HMO | Attending: Cardiovascular Disease | Admitting: Cardiovascular Disease

## 2022-12-31 ENCOUNTER — Encounter: Payer: Self-pay | Admitting: Cardiovascular Disease

## 2022-12-31 VITALS — BP 170/70 | HR 44 | Ht 67.0 in | Wt 197.6 lb

## 2022-12-31 DIAGNOSIS — I441 Atrioventricular block, second degree: Secondary | ICD-10-CM

## 2022-12-31 NOTE — Progress Notes (Signed)
Electrophysiology Office Note:    Date:  12/31/2022   ID:  Maurice, Colon [birth year], MRN [id number]  PCP:  Wanda Plump, MD   [facility] Providers Cardiologist:  None     Referring MD: Jake Bathe, MD   History of Present Illness:    Maurice Colon is a 89 y.o. male with a hx listed below, significant for hyperlipidemia, hypertension referred for arrhythmia management.  He was noted by his PCP to have a low heart rate.  ECG showed AV block with Herbie Saxon and occasional 2-1 AV block with a ventricular rate of 40 bpm.  He notices paroxysms of fatigue. He will be performing some task, feeling fine, then abruptly feel fatigued. He has not had syncope, near-syncope.  Past Medical History:  Diagnosis Date   Bradycardia    DJD (degenerative joint disease)    Hyperlipidemia    Hypertriglyceridemia   Hypertension    Hypogonadism, male    Osteoporosis    Prediabetes 07/16/2013   Rosacea     Past Surgical History:  Procedure Laterality Date   CATARACT EXTRACTION     Bilaterally   INGUINAL HERNIA REPAIR      Current Medications: Current Meds  Medication Sig   atorvastatin (LIPITOR) 10 MG tablet Take 1 tablet (10 mg total) by mouth daily.   augmented betamethasone dipropionate (DIPROLENE-AF) 0.05 % cream Apply topically 2 (two) times daily as needed.   losartan-hydrochlorothiazide (HYZAAR) 100-25 MG tablet Take 1 tablet by mouth daily.   meloxicam (MOBIC) 7.5 MG tablet Take 1-2 tablets (7.5-15 mg total) by mouth daily as needed for pain.   potassium chloride (KLOR-CON M) 10 MEQ tablet Take 1 tablet (10 mEq total) by mouth daily.   terazosin (HYTRIN) 1 MG capsule Take 1 capsule (1 mg total) by mouth at bedtime.     Allergies:   Ace inhibitors, Amlodipine besylate, and Metformin and related   Social and Family History: Reviewed in Epic  ROS:   Please see the history of present illness.    All other systems reviewed and are  negative.  EKGs/Labs/Other Studies Reviewed Today:    Echocardiogram:  TTE 12/22/2022 EF 60-65%. No significant valvular disease -- aortic sclerosis and trivial MR noted.   Monitors:   Stress testing:   Advanced imaging:   Cardiac catherization    EKG:  Last EKG results: today Mobitz I AV block EKG 11/01/2022 -- sinus rhythm with 2:1 AV block; 03/30/2023 -- sinus rhythm with Mobitz I  Recent Labs: 06/01/2022: ALT 23; Hemoglobin 14.4; Platelets 209.0 11/01/2022: BUN 18; Creatinine, Ser 1.03; Potassium 3.9; Sodium 137; TSH 2.39     Physical Exam:    VS:  BP (!) 170/70 (BP Location: Left Arm, Patient Position: Sitting, Cuff Size: Normal)   Pulse (!) 44   Ht 5\' 7"  (1.702 m)   Wt 197 lb 9.6 oz (89.6 kg)   SpO2 92%   BMI 30.95 kg/m     Wt Readings from Last 3 Encounters:  12/31/22 197 lb 9.6 oz (89.6 kg)  11/25/22 198 lb (89.8 kg)  11/01/22 198 lb (89.8 kg)     GEN: Well nourished, well developed in no acute distress CARDIAC: RRR, no murmurs, rubs, gallops RESPIRATORY:  Normal work of breathing MUSCULOSKELETAL: no edema    ASSESSMENT & PLAN:    Second degree AV block Symptomatic with paroxysms of fatigue No reversible cause I recommended dual chamber pacemaker. Taking a shared decision approach, he would like to  proceed with implant.  I discussed the indication for the procedure and the logistics, risks, potential benefit, and after care. I specifically explained that risks include but are not limited to infection, bleeding,damage to blood vessels, lung, and the heart -- but risk of prolonged hospitalization, need for surgery, or the event of stroke, heart attack, or death are low but not zero.           Medication Adjustments/Labs and Tests Ordered: Current medicines are reviewed at length with the patient today.  Concerns regarding medicines are outlined above.  No orders of the defined types were placed in this encounter.  No orders of the defined  types were placed in this encounter.    Signed, Maurice Small, MD  12/31/2022 12:35 PM    [facility]

## 2022-12-31 NOTE — Patient Instructions (Signed)
Medication Instructions:  Your physician recommends that you continue on your current medications as directed. Please refer to the Current Medication list given to you today. *If you need a refill on your cardiac medications before your next appointment, please call your pharmacy*   Lab Work: CBC and BMET on 01/17/23 If you have labs (blood work) drawn today and your tests are completely normal, you will receive your results only by: MyChart Message (if you have MyChart) OR A paper copy in the mail If you have any lab test that is abnormal or we need to change your treatment, we will call you to review the results.   Testing/Procedures: Pacemaker Placement - scheduled for Tuesday, July 2nd  Your physician has recommended that you have a pacemaker inserted. A pacemaker is a small device that is placed under the skin of your chest or abdomen to help control abnormal heart rhythms. This device uses electrical pulses to prompt the heart to beat at a normal rate. Pacemakers are used to treat heart rhythms that are too slow. Wire (leads) are attached to the pacemaker that goes into the chambers of you heart. This is done in the hospital and usually requires and overnight stay. Please see the instruction sheet given to you today for more information.    Follow-Up: At [facility], you and your health needs are our priority.  As part of our continuing mission to provide you with exceptional heart care, we have created designated Provider Care Teams.  These Care Teams include your primary Cardiologist (physician) and Advanced Practice Providers (APPs -  Physician Assistants and Nurse Practitioners) who all work together to provide you with the care you need, when you need it.  We recommend signing up for the patient portal called "MyChart".  Sign up information is provided on this After Visit Summary.  MyChart is used to connect with patients for Virtual Visits (Telemedicine).  Patients are able  to view lab/test results, encounter notes, upcoming appointments, etc.  Non-urgent messages can be sent to your provider as well.   To learn more about what you can do with MyChart, go to [email or web address].    Your next appointment:   Lab work on 01/17/23 at [address] office Pacemaker implant on 02/01/23 at [facility]   Provider:   York Pellant, MD

## 2023-01-17 ENCOUNTER — Ambulatory Visit: Payer: Medicare HMO | Attending: Cardiovascular Disease

## 2023-01-17 DIAGNOSIS — I441 Atrioventricular block, second degree: Secondary | ICD-10-CM

## 2023-01-18 LAB — BASIC METABOLIC PANEL
BUN/Creatinine Ratio: 23 (ref 10–24)
BUN: 25 mg/dL (ref 8–27)
CO2: 21 mmol/L (ref 20–29)
Calcium: 9.4 mg/dL (ref 8.6–10.2)
Chloride: 104 mmol/L (ref 96–106)
Creatinine, Ser: 1.1 mg/dL (ref 0.76–1.27)
Glucose: 94 mg/dL (ref 70–99)
Potassium: 4.5 mmol/L (ref 3.5–5.2)
Sodium: 140 mmol/L (ref 134–144)
eGFR: 65 mL/min/{1.73_m2} (ref >59)

## 2023-01-18 LAB — CBC WITH DIFFERENTIAL/PLATELET
Basophils Absolute: 0.1 10*3/uL (ref 0.0–0.2)
Basos: 1 %
EOS (ABSOLUTE): 0.3 10*3/uL (ref 0.0–0.4)
Eos: 5 %
Hematocrit: 42.3 % (ref 37.5–51.0)
Hemoglobin: 14.2 g/dL (ref 13.0–17.7)
Immature Grans (Abs): 0 10*3/uL (ref 0.0–0.1)
Immature Granulocytes: 0 %
Lymphocytes Absolute: 1.3 10*3/uL (ref 0.7–3.1)
Lymphs: 27 %
MCH: 30.5 pg (ref 26.6–33.0)
MCHC: 33.6 g/dL (ref 31.5–35.7)
MCV: 91 fL (ref 79–97)
Monocytes Absolute: 0.5 10*3/uL (ref 0.1–0.9)
Monocytes: 11 %
Neutrophils Absolute: 2.8 10*3/uL (ref 1.4–7.0)
Neutrophils: 56 %
Platelets: 236 10*3/uL (ref 150–450)
RBC: 4.66 x10E6/uL (ref 4.14–5.80)
RDW: 12.6 % (ref 11.6–15.4)
WBC: 4.9 10*3/uL (ref 3.4–10.8)

## 2023-02-01 ENCOUNTER — Other Ambulatory Visit: Payer: Self-pay

## 2023-02-01 ENCOUNTER — Encounter (HOSPITAL_COMMUNITY)
Admission: RE | Disposition: A | Discharge status: Home / Self Care | Payer: Self-pay | Source: Home / Self Care | Attending: Cardiovascular Disease

## 2023-02-01 ENCOUNTER — Ambulatory Visit (HOSPITAL_COMMUNITY): Payer: Medicare HMO

## 2023-02-01 ENCOUNTER — Ambulatory Visit (HOSPITAL_COMMUNITY)
Admission: RE | Admit: 2023-02-01 | Discharge: 2023-02-01 | Disposition: A | Discharge status: Home / Self Care | Payer: Medicare HMO | Attending: Cardiovascular Disease | Admitting: Cardiovascular Disease

## 2023-02-01 DIAGNOSIS — I10 Essential (primary) hypertension: Secondary | ICD-10-CM | POA: Diagnosis not present

## 2023-02-01 DIAGNOSIS — E785 Hyperlipidemia, unspecified: Secondary | ICD-10-CM | POA: Insufficient documentation

## 2023-02-01 DIAGNOSIS — R001 Bradycardia, unspecified: Secondary | ICD-10-CM | POA: Diagnosis not present

## 2023-02-01 DIAGNOSIS — Z950 Presence of cardiac pacemaker: Secondary | ICD-10-CM | POA: Diagnosis not present

## 2023-02-01 DIAGNOSIS — I441 Atrioventricular block, second degree: Secondary | ICD-10-CM | POA: Diagnosis not present

## 2023-02-01 HISTORY — PX: PACEMAKER IMPLANT: EP1218

## 2023-02-01 SURGERY — PACEMAKER IMPLANT

## 2023-02-01 MED ORDER — FENTANYL CITRATE (PF) 100 MCG/2ML IJ SOLN
INTRAMUSCULAR | Status: AC
  Filled 2023-02-01: qty 2

## 2023-02-01 MED ORDER — LIDOCAINE HCL 1 % IJ SOLN
INTRAMUSCULAR | Status: AC
  Filled 2023-02-01: qty 60

## 2023-02-01 MED ORDER — LIDOCAINE-EPINEPHRINE 1 %-1:100000 IJ SOLN
INTRAMUSCULAR | Status: AC
  Filled 2023-02-01: qty 1

## 2023-02-01 MED ORDER — CEFAZOLIN SODIUM-DEXTROSE 2-4 GM/100ML-% IV SOLN
2.0000 g | INTRAVENOUS | Status: AC
  Administered 2023-02-01: 2 g via INTRAVENOUS

## 2023-02-01 MED ORDER — ONDANSETRON HCL 4 MG/2ML IJ SOLN: 4.0000 mg | Freq: Four times a day (QID) | INTRAMUSCULAR | Status: DC | PRN

## 2023-02-01 MED ORDER — SODIUM CHLORIDE 0.9 % IV SOLN: INTRAVENOUS | Status: DC

## 2023-02-01 MED ORDER — CEFAZOLIN SODIUM-DEXTROSE 2-4 GM/100ML-% IV SOLN
INTRAVENOUS | Status: AC
  Filled 2023-02-01: qty 100

## 2023-02-01 MED ORDER — CHLORHEXIDINE GLUCONATE 4 % EX SOLN: 4.0000 | Freq: Once | CUTANEOUS | Status: DC

## 2023-02-01 MED ORDER — METOPROLOL SUCCINATE ER 25 MG PO TB24
25.0000 mg | ORAL_TABLET | Freq: Once | ORAL | Status: AC
  Administered 2023-02-01: 25 mg via ORAL
  Filled 2023-02-01: qty 1

## 2023-02-01 MED ORDER — POVIDONE-IODINE 10 % EX SWAB
2.0000 | Freq: Once | CUTANEOUS | Status: AC
  Administered 2023-02-01: 2 via TOPICAL

## 2023-02-01 MED ORDER — ACETAMINOPHEN 325 MG PO TABS: 325.0000 mg | ORAL_TABLET | ORAL | Status: DC | PRN

## 2023-02-01 MED ORDER — LIDOCAINE HCL (PF) 1 % IJ SOLN
INTRAMUSCULAR | Status: AC
  Filled 2023-02-01: qty 60

## 2023-02-01 MED ORDER — LIDOCAINE HCL (PF) 1 % IJ SOLN
INTRAMUSCULAR | Status: DC | PRN
  Administered 2023-02-01: 60 mL

## 2023-02-01 MED ORDER — SODIUM CHLORIDE 0.9 % IV SOLN
INTRAVENOUS | Status: AC
  Filled 2023-02-01: qty 2

## 2023-02-01 MED ORDER — MIDAZOLAM HCL 5 MG/5ML IJ SOLN
INTRAMUSCULAR | Status: DC | PRN
  Administered 2023-02-01 (×3): 1 mg via INTRAVENOUS

## 2023-02-01 MED ORDER — MIDAZOLAM HCL 5 MG/5ML IJ SOLN
INTRAMUSCULAR | Status: AC
  Filled 2023-02-01: qty 5

## 2023-02-01 MED ORDER — SODIUM CHLORIDE 0.9 % IV SOLN
80.0000 mg | INTRAVENOUS | Status: AC
  Administered 2023-02-01: 80 mg

## 2023-02-01 MED ORDER — FENTANYL CITRATE (PF) 100 MCG/2ML IJ SOLN
INTRAMUSCULAR | Status: DC | PRN
  Administered 2023-02-01 (×3): 25 ug via INTRAVENOUS

## 2023-02-01 SURGICAL SUPPLY — 14 items
CABLE SURGICAL S-101-97-12 (CABLE) ×1 IMPLANT
CATH CPS LOCATOR 3D MED (CATHETERS) IMPLANT
GUIDEWIRE VASC J-TIP .035X150 (WIRE) IMPLANT
HELIX LOCKING TOOL (MISCELLANEOUS) ×1
KIT MICROPUNCTURE NIT STIFF (SHEATH) IMPLANT
LEAD ULTIPACE 52 LPA1231/52 (Lead) IMPLANT
LEAD ULTIPACE 58 LPA1231/58 (Lead) IMPLANT
PACEMAKER ASSURITY DR-RF (Pacemaker) IMPLANT
PAD DEFIB RADIO PHYSIO CONN (PAD) ×1 IMPLANT
SHEATH 7FR PRELUDE SNAP 13 (SHEATH) IMPLANT
SHEATH 9FR PRELUDE SNAP 13 (SHEATH) IMPLANT
SLITTER AGILIS HISPRO (INSTRUMENTS) IMPLANT
TOOL HELIX LOCKING (MISCELLANEOUS) IMPLANT
TRAY PACEMAKER INSERTION (PACKS) ×1 IMPLANT

## 2023-02-01 NOTE — Discharge Instructions (Signed)
After Your Pacemaker   You have a [employer] Pacemaker  ACTIVITY Do not lift your arm above shoulder height for 1 week after your procedure. After 7 days, you may progress as below.  You should remove your sling 24 hours after your procedure, unless otherwise instructed by your provider.     Tuesday February 08, 2023  Wednesday February 09, 2023 Thursday February 10, 2023 Friday February 11, 2023   Do not lift, push, pull, or carry anything over 10 pounds with the affected arm until 6 weeks (Tuesday March 15, 2023 ) after your procedure.   You may drive AFTER your wound check, unless you have been told otherwise by your provider.   Ask your healthcare provider when you can go back to work   INCISION/Dressing  If large square, outer bandage is left in place, this can be removed after 24 hours from your procedure. Do not remove steri-strips or glue as below.   If a PRESSURE DRESSING (a bulky dressing that usually goes up over your shoulder) was applied or left in place, please follow instructions given by your provider on when to return to have this removed.   Monitor your Pacemaker site for redness, swelling, and drainage. Call the device clinic at [phone] if you experience these symptoms or fever/chills.  If your incision is sealed with Steri-strips or staples, you may shower 7 days after your procedure or when told by your provider. Do not remove the steri-strips or let the shower hit directly on your site. You may wash around your site with soap and water.    If you were discharged in a sling, please do not wear this during the day more than 48 hours after your surgery unless otherwise instructed. This may increase the risk of stiffness and soreness in your shoulder.   Avoid lotions, ointments, or perfumes over your incision until it is well-healed.  You may use a hot tub or a pool AFTER your wound check appointment if the incision is completely closed.  Pacemaker Alerts:  Some alerts are  vibratory and others beep. These are NOT emergencies. Please call our office to let us know. If this occurs at night or on weekends, it can wait until the next business day. Send a remote transmission.  If your device is capable of reading fluid status (for heart failure), you will be offered monthly monitoring to review this with you.   DEVICE MANAGEMENT Remote monitoring is used to monitor your pacemaker from home. This monitoring is scheduled every 91 days by our office. It allows [place] to keep an eye on the functioning of your device to ensure it is working properly. You will routinely see your Electrophysiologist annually (more often if necessary).   You should receive your ID card for your new device in 4-8 weeks. Keep this card with you at all times once received. Consider wearing a medical alert bracelet or necklace.  Your Pacemaker may be MRI compatible. This will be discussed at your next office visit/wound check.  You should avoid contact with strong electric or magnetic fields.   Do not use amateur (ham) radio equipment or electric (arc) welding torches. MP3 player headphones with magnets should not be used. Some devices are safe to use if held at least 12 inches (30 cm) from your Pacemaker. These include power tools, lawn mowers, and speakers. If you are unsure if something is safe to use, ask your health care provider.  When using your cell phone, hold it  to the ear that is on the opposite side from the Pacemaker. Do not leave your cell phone in a pocket over the Pacemaker.  You may safely use electric blankets, heating pads, computers, and microwave ovens.  Call the office right away if: You have chest pain. You feel more short of breath than you have felt before. You feel more light-headed than you have felt before. Your incision starts to open up.  This information is not intended to replace advice given to you by your health care provider. Make sure you discuss any questions you  have with your health care provider.

## 2023-02-01 NOTE — H&P (Signed)
Electrophysiology Office Note:    Date:  02/01/2023   ID:  Colon, Maurice March 20, 1933, MRN [id number]  PCP:  Wanda Plump, MD   [facility] Providers Cardiologist:  None Electrophysiologist:  Maurice Small, MD     Referring MD: No ref. provider found   History of Present Illness:    Maurice Colon is a 89 y.o. male with a hx listed below, significant for hyperlipidemia, hypertension referred for arrhythmia management.  He was noted by his PCP to have a low heart rate.  ECG showed AV block with Maurice Colon and occasional 2-1 AV block with a ventricular rate of 40 bpm.  He notices paroxysms of fatigue. He will be performing some task, feeling fine, then abruptly feel fatigued. He has not had syncope, near-syncope.  He presents today for placement of a dual chamber pacemaker. He denies having any changes in condition, diagnoses, and medications.  Past Medical History:  Diagnosis Date   Bradycardia    DJD (degenerative joint disease)    Hyperlipidemia    Hypertriglyceridemia   Hypertension    Hypogonadism, male    Osteoporosis    Prediabetes 07/16/2013   Rosacea     Past Surgical History:  Procedure Laterality Date   CATARACT EXTRACTION     Bilaterally   INGUINAL HERNIA REPAIR      Current Medications: Current Meds  Medication Sig   acetaminophen (TYLENOL) 500 MG tablet Take 500 mg by mouth every 6 (six) hours as needed for moderate pain. Takes 1 or 2 as needed   atorvastatin (LIPITOR) 10 MG tablet Take 1 tablet (10 mg total) by mouth daily.   augmented betamethasone dipropionate (DIPROLENE-AF) 0.05 % cream Apply topically 2 (two) times daily as needed.   diphenhydrAMINE (BENADRYL) 25 mg capsule Take 25 mg by mouth at bedtime as needed for sleep.   losartan-hydrochlorothiazide (HYZAAR) 100-25 MG tablet Take 1 tablet by mouth daily.   meloxicam (MOBIC) 7.5 MG tablet Take 1-2 tablets (7.5-15 mg total) by mouth daily as needed for pain.    potassium chloride (KLOR-CON M) 10 MEQ tablet Take 1 tablet (10 mEq total) by mouth daily.   terazosin (HYTRIN) 1 MG capsule Take 1 capsule (1 mg total) by mouth at bedtime.     Allergies:   Ace inhibitors, Amlodipine besylate, Metformin and related, and Pork-derived products   Social and Family History: Reviewed in Epic  ROS:   Please see the history of present illness.    All other systems reviewed and are negative.  EKGs/Labs/Other Studies Reviewed Today:    Echocardiogram:  TTE 12/22/2022 EF 60-65%. No significant valvular disease -- aortic sclerosis and trivial MR noted.   Monitors:   Stress testing:   Advanced imaging:   Cardiac catherization    EKG:  Last EKG results: today Mobitz I AV block EKG 11/01/2022 -- sinus rhythm with 2:1 AV block; 03/30/2023 -- sinus rhythm with Mobitz I  Recent Labs: 06/01/2022: ALT 23 11/01/2022: TSH 2.39 01/17/2023: BUN 25; Creatinine, Ser 1.10; Hemoglobin 14.2; Platelets 236; Potassium 4.5; Sodium 140     Physical Exam:    VS:  BP (!) 207/59 Comment: Called Richardean Canal, RN. Pt given Hyzaar from home meds at 0644 100-25 mg.  Pulse (!) 51   Temp 98.5 F (36.9 C) (Oral)   Resp 16   Ht 5\' 7"  (1.702 m)   Wt 87.1 kg   SpO2 94%   BMI 30.07 kg/m     Wt Readings  from Last 3 Encounters:  02/01/23 87.1 kg  12/31/22 89.6 kg  11/25/22 89.8 kg     GEN: Well nourished, well developed in no acute distress CARDIAC: RRR, no murmurs, rubs, gallops RESPIRATORY:  Normal work of breathing MUSCULOSKELETAL: no edema    ASSESSMENT & PLAN:    Second degree AV block Symptomatic with paroxysms of fatigue No reversible cause I recommended dual chamber pacemaker. Taking a shared decision approach, he would like to proceed with implant. Translator present.      Medication Adjustments/Labs and Tests Ordered: Current medicines are reviewed at length with the patient today.  Concerns regarding medicines are outlined above.  Orders  Placed This Encounter  Procedures   Informed Consent Details: Physician/Practitioner Attestation; Transcribe to consent form and obtain patient signature   Initiate Pre-op Protocol   Apply Cardiac Implantable Device Care Plan   Void on call to EP Lab   Electrode Placement Place arm electrodes on posterior shoulders   Confirm CBC and BMP (or CMP) results within 7 days for inpatient and 30 days for outpatient:   Pre-admission testing diagnosis   Use clippers to remove hair, entire chest area   SCRUB WITH Chlorhexidine (HIBICLENS) 4%   EP PPM/ICD IMPLANT   Insert peripheral IV   Meds ordered this encounter  Medications   0.9 %  sodium chloride infusion   gentamicin (GARAMYCIN) 80 mg in sodium chloride 0.9 % 500 mL irrigation   chlorhexidine (HIBICLENS) 4 % liquid 4 Application   ceFAZolin (ANCEF) IVPB 2g/100 mL premix    Order Specific Question:   Indication:    Answer:   Surgical Prophylaxis   povidone-iodine 10 % swab 2 Application     Signed, Maurice Small, MD  02/01/2023 7:03 AM    [facility]

## 2023-02-01 NOTE — Progress Notes (Signed)
Dr Mealor in and ok to d/c home 

## 2023-02-02 ENCOUNTER — Telehealth: Payer: Self-pay

## 2023-02-02 ENCOUNTER — Encounter (HOSPITAL_COMMUNITY): Payer: Self-pay | Admitting: Cardiovascular Disease

## 2023-02-02 NOTE — Telephone Encounter (Signed)
-----   Message from Sheilah Pigeon, [place] sent at 02/01/2023  2:19 PM EDT ----- Same day d/c  [facility] PPM No a/c  AM

## 2023-02-02 NOTE — Telephone Encounter (Signed)
Using interpreter services attempted to contact patient no answer at any of number under patient snapshot/ unable to leave voicemail. Interpreter was able to leave voicemail at the [employer] [phone]

## 2023-02-02 NOTE — Telephone Encounter (Signed)
Follow-up after same day discharge: Implant date: 02/01/2023 MD: Dr. Nelly Laurence Device: SJ PPM   Wound check visit: 02/17/2023 at 10:40 am 90 day MD follow-up: scheduled  Remote Transmission received:  Dressing/sling removed:

## 2023-02-04 NOTE — Telephone Encounter (Signed)
Using intrepreter services, attempted all numbers on file with "not available." Also, interpreter was unable to leave VM on any lines.  Mychart is not set up. Will need to attempt again at later date.   Message placed in apt. Note to update patients phone numbers.

## 2023-02-07 ENCOUNTER — Emergency Department (HOSPITAL_COMMUNITY)
Admission: EM | Admit: 2023-02-07 | Discharge: 2023-02-07 | Disposition: A | Discharge status: Home / Self Care | Payer: Medicare HMO | Attending: Emergency Medicine | Admitting: Emergency Medicine

## 2023-02-07 ENCOUNTER — Emergency Department (HOSPITAL_COMMUNITY): Payer: Medicare HMO

## 2023-02-07 DIAGNOSIS — R42 Dizziness and giddiness: Secondary | ICD-10-CM | POA: Insufficient documentation

## 2023-02-07 DIAGNOSIS — I6782 Cerebral ischemia: Secondary | ICD-10-CM | POA: Diagnosis not present

## 2023-02-07 DIAGNOSIS — R531 Weakness: Secondary | ICD-10-CM | POA: Diagnosis not present

## 2023-02-07 DIAGNOSIS — R55 Syncope and collapse: Secondary | ICD-10-CM | POA: Diagnosis not present

## 2023-02-07 DIAGNOSIS — I10 Essential (primary) hypertension: Secondary | ICD-10-CM | POA: Diagnosis not present

## 2023-02-07 LAB — CBC
HCT: 42 % (ref 39.0–52.0)
Hemoglobin: 13.7 g/dL (ref 13.0–17.0)
MCH: 30.1 pg (ref 26.0–34.0)
MCHC: 32.6 g/dL (ref 30.0–36.0)
MCV: 92.3 fL (ref 80.0–100.0)
Platelets: 171 10*3/uL (ref 150–400)
RBC: 4.55 MIL/uL (ref 4.22–5.81)
RDW: 13 % (ref 11.5–15.5)
WBC: 5.4 10*3/uL (ref 4.0–10.5)
nRBC: 0 % (ref 0.0–0.2)

## 2023-02-07 LAB — BASIC METABOLIC PANEL
Anion gap: 9 (ref 5–15)
BUN: 31 mg/dL — ABNORMAL HIGH (ref 8–23)
CO2: 24 mmol/L (ref 22–32)
Calcium: 8.6 mg/dL — ABNORMAL LOW (ref 8.9–10.3)
Chloride: 105 mmol/L (ref 98–111)
Creatinine, Ser: 1.28 mg/dL — ABNORMAL HIGH (ref 0.61–1.24)
GFR, Estimated: 54 mL/min — ABNORMAL LOW (ref >60)
Glucose, Bld: 126 mg/dL — ABNORMAL HIGH (ref 70–99)
Potassium: 3.9 mmol/L (ref 3.5–5.1)
Sodium: 138 mmol/L (ref 135–145)

## 2023-02-07 LAB — TROPONIN I (HIGH SENSITIVITY): Troponin I (High Sensitivity): 14 ng/L (ref <18)

## 2023-02-07 LAB — CBG MONITORING, ED: Glucose-Capillary: 99 mg/dL (ref 70–99)

## 2023-02-07 MED ORDER — SODIUM CHLORIDE 0.9 % IV BOLUS
1000.0000 mL | Freq: Once | INTRAVENOUS | Status: AC
  Administered 2023-02-07: 1000 mL via INTRAVENOUS

## 2023-02-07 NOTE — Telephone Encounter (Signed)
Attempted all numbers on file with no answer. No VM was available to leave message. Patient has a device apt upcoming. Will review information at that apt.

## 2023-02-07 NOTE — ED Provider Triage Note (Signed)
Emergency Medicine Provider Triage Evaluation Note  Maurice Colon , a 89 y.o. male  was evaluated in triage.  Pt complains of dizziness and SOB x1 week. Reports surgery last week and has been dizzy and SOB since then. Denies chest pain. Dizziness worse when standing.  Review of Systems  Positive: Dizziness, SOB Negative: Chest pain  Physical Exam  BP (!) 174/86   Pulse 71   Temp 98.5 F (36.9 C) (Oral)   Resp 15   SpO2 96%  Gen:   Awake, no distress   Resp:  Normal effort  MSK:   Moves extremities without difficulty  Other:  No focal deficits  Medical Decision Making  Medically screening exam initiated at 2:19 PM.  Appropriate orders placed.  Maurice Colon was informed that the remainder of the evaluation will be completed by another provider, this initial triage assessment does not replace that evaluation, and the importance of remaining in the ED until their evaluation is complete.    Maurice Colon, [place] 02/07/23 1420

## 2023-02-07 NOTE — ED Notes (Signed)
Patient transported to CT 

## 2023-02-07 NOTE — ED Notes (Signed)
Daughter Heywood Footman [phone] would like an update asap

## 2023-02-07 NOTE — ED Provider Notes (Signed)
[place] EMERGENCY DEPARTMENT AT [facility] Provider Note   CSN: [id number] Arrival date & time: 02/07/23  1248     History  Chief Complaint  Patient presents with   Dizziness    Maurice Colon is a 89 y.o. male.  Patient here with dizziness symptoms now resolved.  He had pacemaker placed last week.  Seems like when he stands up dizziness is worse.  He was seen by EMS yesterday and had positive orthostatics.  Right now he is asymptomatic.  He has already been evaluated with lab work.  He denies any chest pain or shortness of breath or weakness or numbness or chills.  Denies any abdominal pain nausea vomiting diarrhea.  No vision loss, no weakness, no stroke symptoms.  The history is provided by the patient.       Home Medications Prior to Admission medications   Medication Sig Start Date End Date Taking? Authorizing Provider  acetaminophen (TYLENOL) 500 MG tablet Take 500 mg by mouth every 6 (six) hours as needed for moderate pain. Takes 1 or 2 as needed    [provider]  atorvastatin (LIPITOR) 10 MG tablet Take 1 tablet (10 mg total) by mouth daily. 12/06/22   Wanda Plump, MD  augmented betamethasone dipropionate (DIPROLENE-AF) 0.05 % cream Apply topically 2 (two) times daily as needed. 05/04/22   Wanda Plump, MD  diphenhydrAMINE (BENADRYL) 25 mg capsule Take 25 mg by mouth at bedtime as needed for sleep.    [provider]  losartan-hydrochlorothiazide (HYZAAR) 100-25 MG tablet Take 1 tablet by mouth daily. 12/06/22   Wanda Plump, MD  meloxicam (MOBIC) 7.5 MG tablet Take 1-2 tablets (7.5-15 mg total) by mouth daily as needed for pain. 11/11/22   Wanda Plump, MD  potassium chloride (KLOR-CON M) 10 MEQ tablet Take 1 tablet (10 mEq total) by mouth daily. 11/11/22   Wanda Plump, MD  terazosin (HYTRIN) 1 MG capsule Take 1 capsule (1 mg total) by mouth at bedtime. 11/11/22   Wanda Plump, MD      Allergies    Ace inhibitors, Amlodipine besylate,  Metformin and related, and Pork-derived products    Review of Systems   Review of Systems  Physical Exam Updated Vital Signs BP (!) 168/85   Pulse 76   Temp 98.1 F (36.7 C) (Oral)   Resp 15   SpO2 100%  Physical Exam Vitals and nursing note reviewed.  Constitutional:      General: He is not in acute distress.    Appearance: He is well-developed. He is not ill-appearing.  HENT:     Head: Normocephalic and atraumatic.     Nose: Nose normal.     Mouth/Throat:     Mouth: Mucous membranes are moist.  Eyes:     Extraocular Movements: Extraocular movements intact.     Conjunctiva/sclera: Conjunctivae normal.     Pupils: Pupils are equal, round, and reactive to light.  Cardiovascular:     Rate and Rhythm: Normal rate and regular rhythm.     Pulses: Normal pulses.     Heart sounds: Normal heart sounds. No murmur heard. Pulmonary:     Effort: Pulmonary effort is normal. No respiratory distress.     Breath sounds: Normal breath sounds.  Abdominal:     Palpations: Abdomen is soft.     Tenderness: There is no abdominal tenderness.  Musculoskeletal:        General: No swelling.  Cervical back: Normal range of motion and neck supple.  Skin:    General: Skin is warm and dry.     Capillary Refill: Capillary refill takes less than 2 seconds.  Neurological:     General: No focal deficit present.     Mental Status: He is alert and oriented to person, place, and time.     Cranial Nerves: No cranial nerve deficit.     Sensory: No sensory deficit.     Motor: No weakness.     Coordination: Coordination normal.     Comments: 5+/5 strength, normal sensation, no drift, normal gait  Psychiatric:        Mood and Affect: Mood normal.     ED Results / Procedures / Treatments   Labs (all labs ordered are listed, but only abnormal results are displayed) Labs Reviewed  BASIC METABOLIC PANEL - Abnormal; Notable for the following components:      Result Value   Glucose, Bld 126 (*)     BUN 31 (*)    Creatinine, Ser 1.28 (*)    Calcium 8.6 (*)    GFR, Estimated 54 (*)    All other components within normal limits  CBC  CBG MONITORING, ED  TROPONIN I (HIGH SENSITIVITY)    EKG EKG Interpretation Date/Time:  Monday February 07 2023 12:54:55 EDT Ventricular Rate:  76 PR Interval:  204 QRS Duration:  118 QT Interval:  410 QTC Calculation: 461 R Axis:   158  Text Interpretation: Atrial-sensed ventricular-paced rhythm Abnormal ECG No previous ECGs available Confirmed by Virgina Norfolk [phone]) on 02/07/2023 4:05:09 PM  Radiology CT Head Wo Contrast  Result Date: 02/07/2023 CLINICAL DATA:  Provided history: Syncope/presyncope, cerebrovascular cause suspected. EXAM: CT HEAD WITHOUT CONTRAST TECHNIQUE: Contiguous axial images were obtained from the base of the skull through the vertex without intravenous contrast. RADIATION DOSE REDUCTION: This exam was performed according to the departmental dose-optimization program which includes automated exposure control, adjustment of the mA and/or kV according to patient size and/or use of iterative reconstruction technique. COMPARISON:  No pertinent prior exams available for comparison. FINDINGS: Brain: No age advanced or lobar predominant parenchymal atrophy. Patchy and ill-defined hypoattenuation within the cerebral white matter, nonspecific but compatible with moderate chronic small vessel ischemic disease. 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 No demarcated cortical infarct. No extra-axial fluid collection. No evidence of an intracranial mass. No midline shift. Vascular: No hyperdense vessel.  Atherosclerotic calcifications. Skull: No calvarial fracture or aggressive osseous lesion. Sinuses/Orbits: No mass or acute finding within the imaged orbits. Extensive partial opacification of the bilateral maxillary sinuses (with associated chronic reactive osteitis). Mucosal  thickening within the bilateral sphenoid sinuses (mild-to-moderate right, mild left). Extensive partial opacification of bilateral ethmoid air cells. Complete opacification of the right frontal sinus. Moderate mucosal thickening within the left frontal sinus. IMPRESSION: 1.  No evidence of an acute intracranial abnormality. 2. Moderate chronic small vessel ischemic changes within the cerebral white matter. 3. 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 4. Paranasal sinus disease as described. Electronically Signed   By: Jackey Loge D.O.   On: 02/07/2023 15:01    Procedures Procedures    Medications Ordered in ED Medications  sodium chloride 0.9 % bolus 1,000 mL (0 mLs Intravenous Stopped 02/07/23 1747)    ED Course/ Medical Decision Making/ A&P  Medical Decision Making Amount and/or Complexity of Data Reviewed Labs: ordered.   Lyman A Nieves-Rodriguez is here with dizziness episodes.  He is asymptomatic now.  Seems like he was orthostatic yesterday with EMS.  He is able to ambulate in the room without any issues.  Patient with unremarkable vitals.  No fever.  No numbness or weakness or change in mental status.  He is able to ambulate in the room without any issues.  Denies any speech changes or weakness or numbness.  He is not short of breath not having chest pain.  Started had blood work done that shows no significant anemia or electrolyte abnormality or kidney injury or leukocytosis.  Troponin was normal.  EKG shows paced rhythm per my review and interpretation.  This appears to be the same status post his pacemaker a week ago.  I will have his pacemaker interrogated and anticipate discharge to home.  I have no concern for infectious process or ACS or PE.  No concern for stroke.  Pacemaker report with no events.  Overall pacemaker is working well.  He is feeling better after IV fluids.  He has had 6% PVC burden but otherwise no abnormalities  with pacemaker.  Discharged in good condition.  Recommend follow-up with primary care doctor.  This chart was dictated using voice recognition software.  Despite best efforts to proofread,  errors can occur which can change the documentation meaning.         Final Clinical Impression(s) / ED Diagnoses Final diagnoses:  Dizziness    Rx / DC Orders ED Discharge Orders     None         Virgina Norfolk, DO 02/07/23 2212

## 2023-02-07 NOTE — ED Notes (Signed)
Jordy from [facility] called to report pt pacemaker appears to be functioning well.  PVC burden of 6%. Full report to be faxed.

## 2023-02-07 NOTE — Telephone Encounter (Signed)
Remote monitor is connected on cellular.

## 2023-02-07 NOTE — ED Triage Notes (Signed)
Pt BIB ems for dizziness. EMS states they were called out yesterday for the same but did not come in. Pt was positive orthostatic for EMS and dizziness improved with 350ns. Denies any SOB or chest pain. Has some intermittent  neck pain

## 2023-02-17 ENCOUNTER — Ambulatory Visit: Payer: Medicare HMO | Attending: Internal Medicine

## 2023-02-17 DIAGNOSIS — I441 Atrioventricular block, second degree: Secondary | ICD-10-CM

## 2023-02-17 LAB — CUP PACEART INCLINIC DEVICE CHECK
Battery Remaining Longevity: 51 mo
Battery Voltage: 3.02 V
Brady Statistic RA Percent Paced: 74 %
Brady Statistic RV Percent Paced: 90 %
Date Time Interrogation Session: 20240718133056
Implantable Lead Connection Status: 753985
Implantable Lead Connection Status: 753985
Implantable Lead Implant Date: 20240702
Implantable Lead Implant Date: 20240702
Implantable Lead Location: 753859
Implantable Lead Location: 753860
Implantable Pulse Generator Implant Date: 20240702
Lead Channel Impedance Value: 425 Ohm
Lead Channel Impedance Value: 525 Ohm
Lead Channel Pacing Threshold Amplitude: 0.75 V
Lead Channel Pacing Threshold Amplitude: 0.75 V
Lead Channel Pacing Threshold Amplitude: 0.75 V
Lead Channel Pacing Threshold Amplitude: 0.75 V
Lead Channel Pacing Threshold Pulse Width: 0.5 ms
Lead Channel Pacing Threshold Pulse Width: 0.5 ms
Lead Channel Pacing Threshold Pulse Width: 0.5 ms
Lead Channel Pacing Threshold Pulse Width: 0.5 ms
Lead Channel Sensing Intrinsic Amplitude: 1.1 mV
Lead Channel Sensing Intrinsic Amplitude: 12 mV
Lead Channel Setting Pacing Amplitude: 3.5 V
Lead Channel Setting Pacing Amplitude: 3.5 V
Lead Channel Setting Pacing Pulse Width: 0.5 ms
Lead Channel Setting Sensing Sensitivity: 2 mV
Pulse Gen Model: 2272
Pulse Gen Serial Number: 5829960

## 2023-02-17 NOTE — Progress Notes (Unsigned)

## 2023-02-17 NOTE — Patient Instructions (Addendum)
After Your Pacemaker   Monitor your pacemaker site for redness, swelling, and drainage. Call the device clinic at [phone] if you experience these symptoms or fever/chills.  Your incision was closed with Steri-strips or staples:  You may shower 7 days after your procedure and wash your incision with soap and water. Avoid lotions, ointments, or perfumes over your incision until it is well-healed.  You may use a hot tub or a pool after your wound check appointment if the incision is completely closed.  Do not lift, push or pull greater than 10 pounds with the affected arm until 6 weeks after your procedure. There are no other restrictions in arm movement after your wound check appointment.  You may drive, unless driving has been restricted by your healthcare providers.  Remote monitoring is used to monitor your pacemaker from home. This monitoring is scheduled every 91 days by our office. It allows [place] to keep an eye on the functioning of your device to ensure it is working properly. You will routinely see your Electrophysiologist annually (more often if necessary).                                  Despus de Press photographer del marcapasos para Engineer, manufacturing enrojecimiento, hinchazn y drenaje. Llame a la clnica del dispositivo al [phone] si experimenta estos sntomas o fiebre/escalofros.   Su incisin se cerr con Steri-strips o grapas: Puede ducharse 7 das despus del procedimiento y lavar la incisin con agua y [place]. Evite lociones, ungentos o perfumes sobre la incisin hasta que haya sanado bien.   Puede usar un jacuzzi o una piscina despus de su cita para revisar la herida si la incisin est completamente cerrada.   No levante, empuje ni tire ms de 10 libras con el brazo afectado hasta 6 semanas despus del procedimiento. No existen otras restricciones en el movimiento del brazo despus de su cita para el control de heridas.   Puede conducir, a menos que  sus proveedores de atencin Scientist, research (medical) restringido la conduccin.   La monitorizacin remota se [place] para controlar su marcapasos desde casa. Este monitoreo es programado cada [address] por nuestra oficina. Nos permite vigilar el funcionamiento de su dispositivo para asegurarnos de que funciona correctamente. Visitar a su electrofisilogo de forma rutinaria anualmente (ms a menudo si es necesario).

## 2023-04-12 ENCOUNTER — Other Ambulatory Visit: Payer: Self-pay | Admitting: Internal Medicine

## 2023-05-01 ENCOUNTER — Other Ambulatory Visit: Payer: Self-pay | Admitting: Internal Medicine

## 2023-05-01 DIAGNOSIS — E785 Hyperlipidemia, unspecified: Secondary | ICD-10-CM

## 2023-05-03 ENCOUNTER — Ambulatory Visit: Payer: Medicare HMO

## 2023-05-03 DIAGNOSIS — I441 Atrioventricular block, second degree: Secondary | ICD-10-CM

## 2023-05-03 LAB — CUP PACEART REMOTE DEVICE CHECK
Battery Remaining Longevity: 45 mo
Battery Remaining Percentage: 95.5 %
Battery Voltage: 2.98 V
Brady Statistic AP VP Percent: 88 %
Brady Statistic AP VS Percent: 1 %
Brady Statistic AS VP Percent: 9.6 %
Brady Statistic AS VS Percent: 1 %
Brady Statistic RA Percent Paced: 85 %
Brady Statistic RV Percent Paced: 97 %
Date Time Interrogation Session: 20241001020012
Implantable Lead Connection Status: 753985
Implantable Lead Connection Status: 753985
Implantable Lead Implant Date: 20240702
Implantable Lead Implant Date: 20240702
Implantable Lead Location: 753859
Implantable Lead Location: 753860
Implantable Pulse Generator Implant Date: 20240702
Lead Channel Impedance Value: 360 Ohm
Lead Channel Impedance Value: 400 Ohm
Lead Channel Pacing Threshold Amplitude: 0.75 V
Lead Channel Pacing Threshold Amplitude: 0.75 V
Lead Channel Pacing Threshold Pulse Width: 0.5 ms
Lead Channel Pacing Threshold Pulse Width: 0.5 ms
Lead Channel Sensing Intrinsic Amplitude: 1.1 mV
Lead Channel Sensing Intrinsic Amplitude: 12 mV
Lead Channel Setting Pacing Amplitude: 3.5 V
Lead Channel Setting Pacing Amplitude: 3.5 V
Lead Channel Setting Pacing Pulse Width: 0.5 ms
Lead Channel Setting Sensing Sensitivity: 2 mV
Pulse Gen Model: 2272
Pulse Gen Serial Number: 5829960

## 2023-05-10 ENCOUNTER — Encounter: Payer: Self-pay | Admitting: Cardiovascular Disease

## 2023-05-10 ENCOUNTER — Ambulatory Visit: Payer: Medicare HMO | Attending: Internal Medicine | Admitting: Cardiovascular Disease

## 2023-05-10 VITALS — BP 124/82 | HR 74 | Ht 67.0 in | Wt 197.2 lb

## 2023-05-10 DIAGNOSIS — I441 Atrioventricular block, second degree: Secondary | ICD-10-CM

## 2023-05-10 LAB — CUP PACEART INCLINIC DEVICE CHECK
Battery Remaining Longevity: 80 mo
Battery Voltage: 2.98 V
Brady Statistic RA Percent Paced: 85 %
Brady Statistic RV Percent Paced: 97 %
Date Time Interrogation Session: 20241008125328
Implantable Lead Connection Status: 753985
Implantable Lead Connection Status: 753985
Implantable Lead Implant Date: 20240702
Implantable Lead Implant Date: 20240702
Implantable Lead Location: 753859
Implantable Lead Location: 753860
Implantable Pulse Generator Implant Date: 20240702
Lead Channel Impedance Value: 362.5 Ohm
Lead Channel Impedance Value: 400 Ohm
Lead Channel Pacing Threshold Amplitude: 0.75 V
Lead Channel Pacing Threshold Amplitude: 0.75 V
Lead Channel Pacing Threshold Amplitude: 0.75 V
Lead Channel Pacing Threshold Amplitude: 0.75 V
Lead Channel Pacing Threshold Pulse Width: 0.5 ms
Lead Channel Pacing Threshold Pulse Width: 0.5 ms
Lead Channel Pacing Threshold Pulse Width: 0.5 ms
Lead Channel Pacing Threshold Pulse Width: 0.5 ms
Lead Channel Sensing Intrinsic Amplitude: 0.9 mV
Lead Channel Sensing Intrinsic Amplitude: 12 mV
Lead Channel Setting Pacing Amplitude: 2 V
Lead Channel Setting Pacing Amplitude: 2.5 V
Lead Channel Setting Pacing Pulse Width: 0.5 ms
Lead Channel Setting Sensing Sensitivity: 2 mV
Pulse Gen Model: 2272
Pulse Gen Serial Number: 5829960

## 2023-05-10 NOTE — Progress Notes (Signed)
  Electrophysiology Office Note:    Date:  05/10/2023   ID:  Colon, Maurice Nov 24, 1932, MRN [id number]  PCP:  Wanda Plump, MD   [facility] Providers Cardiologist:  None     Referring MD: Wanda Plump, MD   History of Present Illness:    Maurice Colon is a 89 y.o. male with a hx listed below, significant for hyperlipidemia, hypertension referred for arrhythmia management.  He was noted by his PCP to have a low heart rate.  ECG showed AV block with Herbie Saxon and occasional 2-1 AV block with a ventricular rate of 40 bpm.  He notices paroxysms of fatigue. He will be performing some task, feeling fine, then abruptly feel fatigued. He has not had syncope, near-syncope.   he has no device related complaints -- no new tenderness, drainage, redness.    EKGs/Labs/Other Studies Reviewed Today:    Echocardiogram:  TTE 12/22/2022 EF 60-65%. No significant valvular disease -- aortic sclerosis and trivial MR noted.   Monitors:   Stress testing:   Advanced imaging:   Cardiac catherization    EKG Interpretation Date/Time:  Tuesday May 10 2023 10:37:13 EDT Ventricular Rate:  74 PR Interval:  202 QRS Duration:  116 QT Interval:  416 QTC Calculation: 461 R Axis:   -44  Text Interpretation: AV dual-paced rhythm with occasional Premature ventricular complexes When compared with ECG of 07-Feb-2023 12:54, Premature ventricular complexes are now Present Vent. rate has decreased BY   2 BPM Confirmed by York Pellant [phone]) on 05/10/2023 11:05:42 AM    Recent Labs: 06/01/2022: ALT 23 11/01/2022: TSH 2.39 02/07/2023: BUN 31; Creatinine, Ser 1.28; Hemoglobin 13.7; Platelets 171; Potassium 3.9; Sodium 138     Physical Exam:    VS:  BP 124/82 (BP Location: Left Arm, Patient Position: Sitting, Cuff Size: Large)   Pulse 74   Ht 5\' 7"  (1.702 m)   Wt 197 lb 3.2 oz (89.4 kg)   SpO2 95%   BMI 30.89 kg/m     Wt Readings from Last 3 Encounters:   05/10/23 197 lb 3.2 oz (89.4 kg)  02/01/23 192 lb (87.1 kg)  12/31/22 197 lb 9.6 oz (89.6 kg)     GEN: Well nourished, well developed in no acute distress CARDIAC: RRR, no murmurs, rubs, gallops The device site is normal -- no tenderness, edema, drainage, redness, threatened erosion.  RESPIRATORY:  Normal work of breathing MUSCULOSKELETAL: no edema    ASSESSMENT & PLAN:    Second degree AV block Symptomatic with paroxysms of fatigue No reversible cause Abbott dual-chamber pacemaker functioning normally I reviewed today's interrogation in detail.  See Paceart for report            Medication Adjustments/Labs and Tests Ordered: Current medicines are reviewed at length with the patient today.  Concerns regarding medicines are outlined above.  Orders Placed This Encounter  Procedures   EKG 12-Lead   No orders of the defined types were placed in this encounter.    Signed, Maurice Small, MD  05/10/2023 11:06 AM    [facility]

## 2023-05-10 NOTE — Patient Instructions (Signed)

## 2023-05-19 NOTE — Progress Notes (Signed)
Remote pacemaker transmission.   

## 2023-06-06 ENCOUNTER — Encounter: Payer: Medicare HMO | Admitting: Internal Medicine

## 2023-06-07 ENCOUNTER — Encounter: Payer: Self-pay | Admitting: Internal Medicine

## 2023-06-07 ENCOUNTER — Ambulatory Visit: Payer: Medicare HMO | Admitting: *Deleted

## 2023-06-07 ENCOUNTER — Encounter: Payer: Medicare HMO | Admitting: Internal Medicine

## 2023-06-07 ENCOUNTER — Ambulatory Visit: Payer: Medicare HMO | Admitting: Internal Medicine

## 2023-06-07 VITALS — BP 126/80 | HR 71 | Temp 97.8°F | Resp 16 | Ht 67.0 in | Wt 195.5 lb

## 2023-06-07 VITALS — BP 126/80 | HR 71 | Ht 67.0 in | Wt 195.5 lb

## 2023-06-07 DIAGNOSIS — I2089 Other forms of angina pectoris: Secondary | ICD-10-CM | POA: Diagnosis not present

## 2023-06-07 DIAGNOSIS — R079 Chest pain, unspecified: Secondary | ICD-10-CM | POA: Diagnosis not present

## 2023-06-07 DIAGNOSIS — E785 Hyperlipidemia, unspecified: Secondary | ICD-10-CM

## 2023-06-07 DIAGNOSIS — E119 Type 2 diabetes mellitus without complications: Secondary | ICD-10-CM | POA: Diagnosis not present

## 2023-06-07 DIAGNOSIS — Z0000 Encounter for general adult medical examination without abnormal findings: Secondary | ICD-10-CM | POA: Diagnosis not present

## 2023-06-07 DIAGNOSIS — I10 Essential (primary) hypertension: Secondary | ICD-10-CM

## 2023-06-07 DIAGNOSIS — Z23 Encounter for immunization: Secondary | ICD-10-CM | POA: Diagnosis not present

## 2023-06-07 DIAGNOSIS — Z0001 Encounter for general adult medical examination with abnormal findings: Secondary | ICD-10-CM

## 2023-06-07 MED ORDER — NITROGLYCERIN 0.4 MG SL SUBL: 0.4000 mg | SUBLINGUAL_TABLET | SUBLINGUAL | 3 refills | Status: DC | PRN

## 2023-06-07 NOTE — Progress Notes (Signed)
Subjective:   Maurice Colon is a 89 y.o. male who presents for Medicare Annual/Subsequent preventive examination.  Visit Complete: In person  Cardiac Risk Factors include: advanced age (>70men, >8 women);dyslipidemia;diabetes mellitus;hypertension;male gender     Objective:    Today's Vitals   06/07/23 1406  BP: 126/80  Pulse: 71  Weight: 195 lb 8.5 oz (88.7 kg)  Height: 5\' 7"  (1.702 m)   Body mass index is 30.62 kg/m.     06/07/2023    2:21 PM 02/01/2023    6:50 AM 06/01/2022    9:25 AM 12/19/2019   10:17 AM  Advanced Directives  Does Patient Have a Medical Advance Directive? Yes Yes No No  Type of Advance Directive Living will Living will    Does patient want to make changes to medical advance directive? No - Patient declined No - Patient declined    Would patient like information on creating a medical advance directive?   No - Patient declined No - Patient declined    Current Medications (verified) Outpatient Encounter Medications as of 06/07/2023  Medication Sig   acetaminophen (TYLENOL) 500 MG tablet Take 500 mg by mouth every 6 (six) hours as needed for moderate pain. Takes 1 or 2 as needed   atorvastatin (LIPITOR) 10 MG tablet TAKE 1 TABLET EVERY DAY   augmented betamethasone dipropionate (DIPROLENE-AF) 0.05 % cream Apply topically 2 (two) times daily as needed.   diphenhydrAMINE (BENADRYL) 25 mg capsule Take 25 mg by mouth at bedtime as needed for sleep.   losartan-hydrochlorothiazide (HYZAAR) 100-25 MG tablet TAKE 1 TABLET EVERY DAY   meloxicam (MOBIC) 7.5 MG tablet Take 1-2 tablets (7.5-15 mg total) by mouth daily as needed for pain.   potassium chloride (KLOR-CON M) 10 MEQ tablet Take 1 tablet (10 mEq total) by mouth daily.   terazosin (HYTRIN) 1 MG capsule Take 1 capsule (1 mg total) by mouth at bedtime.   No facility-administered encounter medications on file as of 06/07/2023.    Allergies (verified) Ace inhibitors, Amlodipine besylate,  Metformin and related, and Pork-derived products   History: Past Medical History:  Diagnosis Date   Bradycardia    DJD (degenerative joint disease)    Hyperlipidemia    Hypertriglyceridemia   Hypertension    Hypogonadism, male    Osteoporosis    Prediabetes 07/16/2013   Rosacea    Past Surgical History:  Procedure Laterality Date   CATARACT EXTRACTION     Bilaterally   INGUINAL HERNIA REPAIR     PACEMAKER IMPLANT N/A 02/01/2023   Procedure: PACEMAKER IMPLANT;  Surgeon: Maurice Small, MD;  Location: [facility];  Service: Cardiovascular;  Laterality: N/A;   Family History  Problem Relation Age of Onset   Diabetes Mother    Hypertension Mother    Heart attack Father        age 37   Cancer Neg Hx    Social History   Socioeconomic History   Marital status: Married    Spouse name: Not on file   Number of children: 2   Years of education: Not on file   Highest education level: Not on file  Occupational History   Occupation: retired    Associate Professor: RETIRED  Tobacco Use   Smoking status: Never   Smokeless tobacco: Never  Substance and Sexual Activity   Alcohol use: No    Comment: wine, occasionally   Drug use: Not on file   Sexual activity: Not on file  Other Topics Concern  Not on file  Social History Narrative   Original from [place]   Lives w/ wife and 1 daughter (bipolar)   Moved to [place] early 2008      Social Determinants of Health   Financial Resource Strain: Low Risk  (06/07/2023)   Overall Financial Resource Strain (CARDIA)    Difficulty of Paying Living Expenses: Not hard at all  Food Insecurity: No Food Insecurity (06/07/2023)   Hunger Vital Sign    Worried About Running Out of Food in the Last Year: Never true    Ran Out of Food in the Last Year: Never true  Transportation Needs: No Transportation Needs (06/07/2023)   PRAPARE - Administrator, Civil Service (Medical): No    Lack of Transportation (Non-Medical): No  Physical  Activity: Inactive (06/07/2023)   Exercise Vital Sign    Days of Exercise per Week: 0 days    Minutes of Exercise per Session: 0 min  Stress: No Stress Concern Present (06/07/2023)   [employer] of Occupational Health - Occupational Stress Questionnaire    Feeling of Stress : Not at all  Social Connections: Moderately Integrated (06/07/2023)   Social Connection and Isolation Panel [NHANES]    Frequency of Communication with Friends and Family: More than three times a week    Frequency of Social Gatherings with Friends and Family: More than three times a week    Attends Religious Services: 1 to 4 times per year    Active Member of [employer] or Organizations: No    Attends Engineer, structural: Never    Marital Status: Married    Tobacco Counseling Counseling given: Not Answered   Clinical Intake:  Pre-visit preparation completed: Yes  Pain : No/denies pain  BMI - recorded: 30.62 Nutritional Status: BMI > 30  Obese Nutritional Risks: None Diabetes: Yes CBG done?: No Did pt. bring in CBG monitor from home?: No  How often do you need to have someone help you when you read instructions, pamphlets, or other written materials from your doctor or pharmacy?: 1 - Never  Interpreter Needed?: No  Information entered by :: Donne Anon, CMA   Activities of Daily Living    06/07/2023    2:18 PM  In your present state of health, do you have any difficulty performing the following activities:  Hearing? 0  Vision? 0  Difficulty concentrating or making decisions? 0  Walking or climbing stairs? 0  Dressing or bathing? 0  Doing errands, shopping? 0  Preparing Food and eating ? N  Using the Toilet? N  In the past six months, have you accidently leaked urine? N  Do you have problems with loss of bowel control? N  Managing your Medications? N  Managing your Finances? N  Housekeeping or managing your Housekeeping? N    Patient Care Team: Wanda Plump, MD as PCP -  General [facility], Roberts Gaudy, MD as PCP - Electrophysiology (Cardiology) Cecilie Lowers, MD as Referring Physician (Ophthalmology)  Indicate any recent Medical Services you may have received from other than [facility] providers in the past year (date may be approximate).     Assessment:   This is a routine wellness examination for [place].  Hearing/Vision screen No results found.   Goals Addressed   None    Depression Screen    06/07/2023    1:22 PM 11/01/2022    1:08 PM 06/01/2022    9:25 AM 01/20/2022   10:46 AM 08/14/2021    1:07 PM  04/15/2021    1:52 PM 12/04/2020   10:43 AM  PHQ 2/9 Scores  PHQ - 2 Score 0 0 0 0 0 0 0    Fall Risk    06/07/2023    1:22 PM 11/01/2022    1:08 PM 06/01/2022    9:25 AM 01/20/2022   10:46 AM 08/14/2021    1:07 PM  Fall Risk   Falls in the past year? 0 0 0 0 0  Number falls in past yr: 0 0 0 0 0  Injury with Fall? 0 0 0 0 0  Risk for fall due to :   No Fall Risks    Follow up Falls evaluation completed Falls evaluation completed Falls evaluation completed Falls evaluation completed Falls evaluation completed    MEDICARE RISK AT HOME: Medicare Risk at Home Any stairs in or around the home?: Yes If so, are there any without handrails?: No Home free of loose throw rugs in walkways, pet beds, electrical cords, etc?: Yes Adequate lighting in your home to reduce risk of falls?: Yes Life alert?: No Use of a cane, walker or w/c?: No Grab bars in the bathroom?: No Shower chair or bench in shower?: No Elevated toilet seat or a handicapped toilet?: No  TIMED UP AND GO:  Was the test performed?  Yes  Length of time to ambulate 10 feet: 7 sec Gait slow and steady without use of assistive device    Cognitive Function:    06/07/2023    2:21 PM 06/01/2022    9:28 AM  MMSE - Mini Mental State Exam  Not completed: Unable to complete Refused        Immunizations Immunization History  Administered Date(s) Administered   Fluad Quad(high Dose 65+)  03/30/2019, 06/20/2020, 04/15/2021, 06/01/2022   Fluad Trivalent(High Dose 65+) 06/07/2023   Influenza Whole 05/24/2007, 06/06/2009, 03/31/2010   Influenza, High Dose Seasonal PF 07/16/2013, 04/16/2015, 05/14/2016, 06/17/2017, 08/18/2018   Influenza,inj,Quad PF,6+ Mos 05/01/2014   PFIZER Comirnaty(Gray Top)Covid-19 Tri-Sucrose Vaccine 12/04/2020   PFIZER(Purple Top)SARS-COV-2 Vaccination 10/20/2019, 11/08/2019, 06/19/2020   Pfizer Covid-19 Vaccine Bivalent Booster 64yrs & up 06/20/2021, 08/03/2022   Pneumococcal Conjugate-13 09/19/2014   Pneumococcal Polysaccharide-23 05/24/2007, 04/17/2018   Td 05/18/2010   Tdap 05/05/2021   Zoster Recombinant(Shingrix) 03/11/2020, 05/05/2021   Zoster, Live 08/03/2015    TDAP status: Up to date  Flu Vaccine status: Up to date  Pneumococcal vaccine status: Up to date  Covid-19 vaccine status: Information provided on how to obtain vaccines.   Qualifies for Shingles Vaccine? Yes   Zostavax completed Yes   Shingrix Completed?: Yes  Screening Tests Health Maintenance  Topic Date Due   HEMOGLOBIN A1C  11/30/2022   COVID-19 Vaccine (7 - 2023-24 season) 04/03/2023   Medicare Annual Wellness (AWV)  06/02/2023   FOOT EXAM  06/02/2023   OPHTHALMOLOGY EXAM  07/20/2023   DTaP/Tdap/Td (3 - Td or Tdap) 05/06/2031   Pneumonia Vaccine 23+ Years old  Completed   INFLUENZA VACCINE  Completed   Zoster Vaccines- Shingrix  Completed   HPV VACCINES  Aged Out    Health Maintenance  Health Maintenance Due  Topic Date Due   HEMOGLOBIN A1C  11/30/2022   COVID-19 Vaccine (7 - 2023-24 season) 04/03/2023   Medicare Annual Wellness (AWV)  06/02/2023   FOOT EXAM  06/02/2023    Colorectal cancer screening: No longer required.   Lung Cancer Screening: (Low Dose CT Chest recommended if Age 25-80 years, 20 pack-year currently smoking OR have quit w/in  15years.) does not qualify.   Additional Screening:  Hepatitis C Screening: does not qualify  Vision  Screening: Recommended annual ophthalmology exams for early detection of glaucoma and other disorders of the eye. Is the patient up to date with their annual eye exam?  Yes  Who is the provider or what is the name of the office in which the patient attends annual eye exams? Dr. Lisette Abu If pt is not established with a provider, would they like to be referred to a provider to establish care? No .   Dental Screening: Recommended annual dental exams for proper oral hygiene  Diabetic Foot Exam: Diabetic Foot Exam: Overdue, Pt has been advised about the importance in completing this exam. Pt is scheduled for diabetic foot exam on N/a.  Community Resource Referral / Chronic Care Management: CRR required this visit?  No   CCM required this visit?  No     Plan:     I have personally reviewed and noted the following in the patient's chart:   Medical and social history Use of alcohol, tobacco or illicit drugs  Current medications and supplements including opioid prescriptions. Patient is not currently taking opioid prescriptions. Functional ability and status Nutritional status Physical activity Advanced directives List of other physicians Hospitalizations, surgeries, and ER visits in previous 12 months Vitals Screenings to include cognitive, depression, and falls Referrals and appointments  In addition, I have reviewed and discussed with patient certain preventive protocols, quality metrics, and best practice recommendations. A written personalized care plan for preventive services as well as general preventive health recommendations were provided to patient.     Donne Anon, CMA   06/07/2023   After Visit Summary: pt declined.  Nurse Notes: None

## 2023-06-07 NOTE — Assessment & Plan Note (Signed)
Here for CPX  Stable angina: new issue Reports exertional chest pressure for the last 3 months c/w angina . Had a pacemaker placed ~ 4 months ago, echocardiogram was okay . EKG today: Paced Plan: Start aspirin, NTG, ER severe symptoms, will ask cardiology to see again Pacemaker: Since the last visit, had a pacemaker placed for AV block. Went to the ER with dizziness shortly after the procedure, subsequently saw Cardiology  05/10/2023, >>  pacemaker functioning normal. DM: Diet controlled, check A1c. HTN: BP is very good, continue Hyzaar. High cholesterol: On atorvastatin, check FLP Insomnia: See comments from 11/01/2022 regards  stress and insomnia.  He takes Benadryl with good results.  No change. All instructions in English and discussed in Spanish RTC 3 months.

## 2023-06-07 NOTE — Progress Notes (Signed)
Subjective:    Patient ID: Maurice Colon, male    DOB: 07-24-1933, 89 y.o.   MRN: [id number]  DOS:  06/07/2023 Type of visit - description: cpx  Here for CPX Reported chest pressure, in the context of taking the garbage out, located in the mid anterior chest, Pressure stops with rest. No associated DOE, nausea or diaphoresis. This consistently for the last for the last 3months, after a pacemaker was placed. No pain at the pacemaker site.  No fever or cough.  Review of Systems  Other than above, a 14 point review of systems is negative    Past Medical History:  Diagnosis Date   Bradycardia    DJD (degenerative joint disease)    Hyperlipidemia    Hypertriglyceridemia   Hypertension    Hypogonadism, male    Osteoporosis    Prediabetes 07/16/2013   Rosacea     Past Surgical History:  Procedure Laterality Date   CATARACT EXTRACTION     Bilaterally   INGUINAL HERNIA REPAIR     PACEMAKER IMPLANT N/A 02/01/2023   Procedure: PACEMAKER IMPLANT;  Surgeon: Maurice Small, MD;  Location: [facility];  Service: Cardiovascular;  Laterality: N/A;   Social History   Socioeconomic History   Marital status: Married    Spouse name: Not on file   Number of children: 2   Years of education: Not on file   Highest education level: Not on file  Occupational History   Occupation: retired    Associate Professor: RETIRED  Tobacco Use   Smoking status: Never   Smokeless tobacco: Never  Substance and Sexual Activity   Alcohol use: No    Comment: wine, occasionally   Drug use: Not on file   Sexual activity: Not on file  Other Topics Concern   Not on file  Social History Narrative   Original from [place]   Lives w/ wife and 1 daughter (bipolar)   Moved to [place] early 2008      Social Determinants of Health   Financial Resource Strain: Low Risk  (06/07/2023)   Overall Financial Resource Strain (CARDIA)    Difficulty of Paying Living Expenses: Not hard at all  Food  Insecurity: No Food Insecurity (06/07/2023)   Hunger Vital Sign    Worried About Running Out of Food in the Last Year: Never true    Ran Out of Food in the Last Year: Never true  Transportation Needs: No Transportation Needs (06/07/2023)   PRAPARE - Administrator, Civil Service (Medical): No    Lack of Transportation (Non-Medical): No  Physical Activity: Inactive (06/07/2023)   Exercise Vital Sign    Days of Exercise per Week: 0 days    Minutes of Exercise per Session: 0 min  Stress: No Stress Concern Present (06/07/2023)   [employer] of Occupational Health - Occupational Stress Questionnaire    Feeling of Stress : Not at all  Social Connections: Moderately Integrated (06/07/2023)   Social Connection and Isolation Panel [NHANES]    Frequency of Communication with Friends and Family: More than three times a week    Frequency of Social Gatherings with Friends and Family: More than three times a week    Attends Religious Services: 1 to 4 times per year    Active Member of [employer] or Organizations: No    Attends Banker Meetings: Never    Marital Status: Married  Catering manager Violence: Not At Risk (06/07/2023)   Humiliation, Afraid,  Rape, and Kick questionnaire    Fear of Current or Ex-Partner: No    Emotionally Abused: No    Physically Abused: No    Sexually Abused: No     Current Outpatient Medications  Medication Instructions   acetaminophen (TYLENOL) 500 mg, Oral, Every 6 hours PRN, Takes 1 or 2 as needed   aspirin EC 81 mg, Oral, Daily, Swallow whole.   atorvastatin (LIPITOR) 10 mg, Oral, Daily   augmented betamethasone dipropionate (DIPROLENE-AF) 0.05 % cream Topical, 2 times daily PRN   diphenhydrAMINE (BENADRYL) 25 mg, Oral, At bedtime PRN   losartan-hydrochlorothiazide (HYZAAR) 100-25 MG tablet 1 tablet, Oral, Daily   meloxicam (MOBIC) 7.5-15 mg, Oral, Daily PRN   nitroGLYCERIN (NITROSTAT) 0.4 mg, Sublingual, Every 5 min x3 PRN   potassium  chloride (KLOR-CON M) 10 MEQ tablet 10 mEq, Oral, Daily   terazosin (HYTRIN) 1 mg, Oral, Daily at bedtime       Objective:   Physical Exam BP 126/80   Pulse 71   Temp 97.8 F (36.6 C) (Oral)   Resp 16   Ht 5\' 7"  (1.702 m)   Wt 195 lb 8 oz (88.7 kg)   SpO2 95%   BMI 30.62 kg/m  General: Well developed, NAD, BMI noted Neck: No  thyromegaly  HEENT:  Normocephalic . Face symmetric, atraumatic Lungs:  CTA B Normal respiratory effort, no intercostal retractions, no accessory muscle use. Heart: RRR,  no murmur.  Abdomen:  Not distended, soft, non-tender. No rebound or rigidity.   Lower extremities: no pretibial edema bilaterally  Skin: Exposed areas without rash. Not pale. Not jaundice Neurologic:  alert & oriented X3.  Speech normal, gait appropriate for age and unassisted Strength symmetric and appropriate for age.  Psych: Cognition and judgment appear intact.  Cooperative with normal attention span and concentration.  Behavior appropriate. No anxious or depressed appearing.     Assessment     Assessment  Diabetes: tried metformin, see note 08-30-17, has s/e, ok to try again if needed (low dose) HTN: amlodipine: edema  Dyslipidemia,  high triglycerides Hypogonadism- was unable to afford HRT 2012, no sx  Osteoporosis: T score -2.4  (05-2012). T score -2.3 (05-2016), DEXA 01-2020, T score better at -1.9. Vitamin D deficiency DJD-- meloxicam prn Rosacea Increase LFTs -- First noted ~ 04-2015, no etoh/tylenol. changed simvastatin to Lipitor 04-2014 and Lipitor was d/c 2-17. We also d/c fenofibrates 09-2015; LFTs back to normal  01-2016  Fatty liver per [place] 07-18-2015 Hep B-C  Neg 07-2015;  ceruloplasmin, ferritin, iron and alpha-1 antitrypsin ---> (-) 09-2015 Pacemaker 01-2023.  Dx AV block with Wenckebach    PLAN: Here for CPX -Td: 2022 - PNM 23: 2008, 2019; prevnar: 2016 - s/p zostavax, no documentation; s/p  shingrex x2 -Flu shot today. - Recommend RSV, COVID-vaccine.    -No further colon or prostate cancer screening -Labs:CMP FLP CBC A1c - Diet ,exercise d/w pt   -Healthcare POA: Information provided  Stable angina: new issue Reports exertional chest pressure for the last 3 months c/w angina . Had a pacemaker placed ~ 4 months ago, echocardiogram was okay . EKG today: Paced Plan: Start aspirin, NTG, ER severe symptoms, will ask cardiology to see again Pacemaker: Since the last visit, had a pacemaker placed for AV block. Went to the ER with dizziness shortly after the procedure, subsequently saw Cardiology  05/10/2023, >>  pacemaker functioning normal. DM: Diet controlled, check A1c. HTN: BP is very good, continue Hyzaar. High cholesterol: On atorvastatin, check  FLP Insomnia: See comments from 11/01/2022 regards  stress and insomnia.  He takes Benadryl with good results.  No change. All instructions in English and discussed in Spanish RTC 3 months.

## 2023-06-07 NOTE — Patient Instructions (Addendum)
Vaccines I recommend: Covid booster RSV vaccine  Start aspirin 81 mg 1 tablet every day.  Nitroglycerin If you have chest pressure, stop exercising, take nitroglycerin if the pain does not subside within a couple of minutes. You can take additional nitroglycerin every 5 minutes up to 2 more  times. If the pressure is not better or it becomes severe: Call 911  Check the  blood pressure regularly Blood pressure goal:  between 110/65 and  135/85. If it is consistently higher or lower, let me know     GO TO THE LAB : Get the blood work     Next visit with me  in 3 months  Please schedule it at the front desk

## 2023-06-07 NOTE — Patient Instructions (Signed)
[name] , Thank you for taking time to come for your Medicare Wellness Visit. I appreciate your ongoing commitment to your health goals. Please review the following plan we discussed and let me know if I can assist you in the future.   These are the goals we discussed:  Goals   None     This is a list of the screening recommended for you and due dates:  Health Maintenance  Topic Date Due   Hemoglobin A1C  11/30/2022   COVID-19 Vaccine (7 - 2023-24 season) 04/03/2023   Complete foot exam   06/02/2023   Eye exam for diabetics  07/20/2023   Medicare Annual Wellness Visit  06/06/2024   DTaP/Tdap/Td vaccine (3 - Td or Tdap) 05/06/2031   Pneumonia Vaccine  Completed   Flu Shot  Completed   Zoster (Shingles) Vaccine  Completed   HPV Vaccine  Aged Out     Next appointment: Follow up in one year for your annual wellness visit.   Preventive Care 31 Years and Older, Male Preventive care refers to lifestyle choices and visits with your health care provider that can promote health and wellness. What does preventive care include? A yearly physical exam. This is also called an annual well check. Dental exams once or twice a year. Routine eye exams. Ask your health care provider how often you should have your eyes checked. Personal lifestyle choices, including: Daily care of your teeth and gums. Regular physical activity. Eating a healthy diet. Avoiding tobacco and drug use. Limiting alcohol use. Practicing safe sex. Taking low doses of aspirin every day. Taking vitamin and mineral supplements as recommended by your health care provider. What happens during an annual well check? The services and screenings done by your health care provider during your annual well check will depend on your age, overall health, lifestyle risk factors, and family history of disease. Counseling  Your health care provider may ask you questions about your: Alcohol use. Tobacco use. Drug  use. Emotional well-being. Home and relationship well-being. Sexual activity. Eating habits. History of falls. Memory and ability to understand (cognition). Work and work Astronomer. Screening  You may have the following tests or measurements: Height, weight, and BMI. Blood pressure. Lipid and cholesterol levels. These may be checked every 5 years, or more frequently if you are over 50 years old. Skin check. Lung cancer screening. You may have this screening every year starting at age 56 if you have a 30-pack-year history of smoking and currently smoke or have quit within the past 15 years. Fecal occult blood test (FOBT) of the stool. You may have this test every year starting at age 54. Flexible sigmoidoscopy or colonoscopy. You may have a sigmoidoscopy every 5 years or a colonoscopy every 10 years starting at age 26. Prostate cancer screening. Recommendations will vary depending on your family history and other risks. Hepatitis C blood test. Hepatitis B blood test. Sexually transmitted disease (STD) testing. Diabetes screening. This is done by checking your blood sugar (glucose) after you have not eaten for a while (fasting). You may have this done every 1-3 years. Abdominal aortic aneurysm (AAA) screening. You may need this if you are a current or former smoker. Osteoporosis. You may be screened starting at age 72 if you are at high risk. Talk with your health care provider about your test results, treatment options, and if necessary, the need for more tests. Vaccines  Your health care provider may recommend certain vaccines, such as: Influenza vaccine.  This is recommended every year. Tetanus, diphtheria, and acellular pertussis (Tdap, Td) vaccine. You may need a Td booster every 10 years. Zoster vaccine. You may need this after age 10. Pneumococcal 13-valent conjugate (PCV13) vaccine. One dose is recommended after age 100. Pneumococcal polysaccharide (PPSV23) vaccine. One dose is  recommended after age 55. Talk to your health care provider about which screenings and vaccines you need and how often you need them. This information is not intended to replace advice given to you by your health care provider. Make sure you discuss any questions you have with your health care provider. Document Released: 08/15/2015 Document Revised: 04/07/2016 Document Reviewed: 05/20/2015 Elsevier Interactive Patient Education  2017 [employer].  Fall Prevention in the Home Falls can cause injuries. They can happen to people of all ages. There are many things you can do to make your home safe and to help prevent falls. What can I do on the outside of my home? Regularly fix the edges of walkways and driveways and fix any cracks. Remove anything that might make you trip as you walk through a door, such as a raised step or threshold. Trim any bushes or trees on the path to your home. Use bright outdoor lighting. Clear any walking paths of anything that might make someone trip, such as rocks or tools. Regularly check to see if handrails are loose or broken. Make sure that both sides of any steps have handrails. Any raised decks and porches should have guardrails on the edges. Have any leaves, snow, or ice cleared regularly. Use sand or salt on walking paths during winter. Clean up any spills in your garage right away. This includes oil or grease spills. What can I do in the bathroom? Use night lights. Install grab bars by the toilet and in the tub and shower. Do not use towel bars as grab bars. Use non-skid mats or decals in the tub or shower. If you need to sit down in the shower, use a plastic, non-slip stool. Keep the floor dry. Clean up any water that spills on the floor as soon as it happens. Remove soap buildup in the tub or shower regularly. Attach bath mats securely with double-sided non-slip rug tape. Do not have throw rugs and other things on the floor that can make you  trip. What can I do in the bedroom? Use night lights. Make sure that you have a light by your bed that is easy to reach. Do not use any sheets or blankets that are too big for your bed. They should not hang down onto the floor. Have a firm chair that has side arms. You can use this for support while you get dressed. Do not have throw rugs and other things on the floor that can make you trip. What can I do in the kitchen? Clean up any spills right away. Avoid walking on wet floors. Keep items that you use a lot in easy-to-reach places. If you need to reach something above you, use a strong step stool that has a grab bar. Keep electrical cords out of the way. Do not use floor polish or wax that makes floors slippery. If you must use wax, use non-skid floor wax. Do not have throw rugs and other things on the floor that can make you trip. What can I do with my stairs? Do not leave any items on the stairs. Make sure that there are handrails on both sides of the stairs and use them. Fix handrails  that are broken or loose. Make sure that handrails are as long as the stairways. Check any carpeting to make sure that it is firmly attached to the stairs. Fix any carpet that is loose or worn. Avoid having throw rugs at the top or bottom of the stairs. If you do have throw rugs, attach them to the floor with carpet tape. Make sure that you have a light switch at the top of the stairs and the bottom of the stairs. If you do not have them, ask someone to add them for you. What else can I do to help prevent falls? Wear shoes that: Do not have high heels. Have rubber bottoms. Are comfortable and fit you well. Are closed at the toe. Do not wear sandals. If you use a stepladder: Make sure that it is fully opened. Do not climb a closed stepladder. Make sure that both sides of the stepladder are locked into place. Ask someone to hold it for you, if possible. Clearly mark and make sure that you can  see: Any grab bars or handrails. First and last steps. Where the edge of each step is. Use tools that help you move around (mobility aids) if they are needed. These include: Canes. Walkers. Scooters. Crutches. Turn on the lights when you go into a dark area. Replace any light bulbs as soon as they burn out. Set up your furniture so you have a clear path. Avoid moving your furniture around. If any of your floors are uneven, fix them. If there are any pets around you, be aware of where they are. Review your medicines with your doctor. Some medicines can make you feel dizzy. This can increase your chance of falling. Ask your doctor what other things that you can do to help prevent falls. This information is not intended to replace advice given to you by your health care provider. Make sure you discuss any questions you have with your health care provider. Document Released: 05/15/2009 Document Revised: 12/25/2015 Document Reviewed: 08/23/2014 Elsevier Interactive Patient Education  2017 [employer].

## 2023-06-07 NOTE — Assessment & Plan Note (Signed)
Here for CPX -Td: 2022 - PNM 23: 2008, 2019; prevnar: 2016 - s/p zostavax, no documentation; s/p  shingrex x2 -Flu shot today. - Recommend RSV, COVID-vaccine.   -No further colon or prostate cancer screening -Labs:CMP FLP CBC A1c - Diet ,exercise d/w pt   -Healthcare POA: Information provided

## 2023-06-08 LAB — COMPREHENSIVE METABOLIC PANEL
ALT: 25 U/L (ref 0–53)
AST: 28 U/L (ref 0–37)
Albumin: 4.3 g/dL (ref 3.5–5.2)
Alkaline Phosphatase: 100 U/L (ref 39–117)
BUN: 25 mg/dL — ABNORMAL HIGH (ref 6–23)
CO2: 26 meq/L (ref 19–32)
Calcium: 9.5 mg/dL (ref 8.4–10.5)
Chloride: 102 meq/L (ref 96–112)
Creatinine, Ser: 1.33 mg/dL (ref 0.40–1.50)
GFR: 47.86 mL/min — ABNORMAL LOW (ref >60.00)
Glucose, Bld: 91 mg/dL (ref 70–99)
Potassium: 3.9 meq/L (ref 3.5–5.1)
Sodium: 138 meq/L (ref 135–145)
Total Bilirubin: 1.2 mg/dL (ref 0.2–1.2)
Total Protein: 7.7 g/dL (ref 6.0–8.3)

## 2023-06-08 LAB — LIPID PANEL
Cholesterol: 147 mg/dL (ref 0–200)
HDL: 31.5 mg/dL — ABNORMAL LOW (ref >39.00)
LDL Cholesterol: 56 mg/dL (ref 0–99)
NonHDL: 115.19
Total CHOL/HDL Ratio: 5
Triglycerides: 296 mg/dL — ABNORMAL HIGH (ref 0.0–149.0)
VLDL: 59.2 mg/dL — ABNORMAL HIGH (ref 0.0–40.0)

## 2023-06-08 LAB — CBC WITH DIFFERENTIAL/PLATELET
Basophils Absolute: 0 10*3/uL (ref 0.0–0.1)
Basophils Relative: 0.7 % (ref 0.0–3.0)
Eosinophils Absolute: 0.2 10*3/uL (ref 0.0–0.7)
Eosinophils Relative: 3.7 % (ref 0.0–5.0)
HCT: 41.5 % (ref 39.0–52.0)
Hemoglobin: 13.8 g/dL (ref 13.0–17.0)
Lymphocytes Relative: 23.3 % (ref 12.0–46.0)
Lymphs Abs: 1.5 10*3/uL (ref 0.7–4.0)
MCHC: 33.4 g/dL (ref 30.0–36.0)
MCV: 91.6 fL (ref 78.0–100.0)
Monocytes Absolute: 0.6 10*3/uL (ref 0.1–1.0)
Monocytes Relative: 9.1 % (ref 3.0–12.0)
Neutro Abs: 3.9 10*3/uL (ref 1.4–7.7)
Neutrophils Relative %: 63.2 % (ref 43.0–77.0)
Platelets: 248 10*3/uL (ref 150.0–400.0)
RBC: 4.53 Mil/uL (ref 4.22–5.81)
RDW: 13.9 % (ref 11.5–15.5)
WBC: 6.2 10*3/uL (ref 4.0–10.5)

## 2023-06-08 LAB — HEMOGLOBIN A1C: Hgb A1c MFr Bld: 6 % (ref 4.6–6.5)

## 2023-06-25 ENCOUNTER — Other Ambulatory Visit: Payer: Self-pay | Admitting: Internal Medicine

## 2023-07-15 ENCOUNTER — Encounter: Payer: Self-pay | Admitting: Cardiology

## 2023-07-22 DIAGNOSIS — H353131 Nonexudative age-related macular degeneration, bilateral, early dry stage: Secondary | ICD-10-CM | POA: Diagnosis not present

## 2023-07-22 DIAGNOSIS — H04121 Dry eye syndrome of right lacrimal gland: Secondary | ICD-10-CM | POA: Diagnosis not present

## 2023-07-22 DIAGNOSIS — H04122 Dry eye syndrome of left lacrimal gland: Secondary | ICD-10-CM | POA: Diagnosis not present

## 2023-08-02 ENCOUNTER — Ambulatory Visit (INDEPENDENT_AMBULATORY_CARE_PROVIDER_SITE_OTHER): Payer: Medicare HMO

## 2023-08-02 DIAGNOSIS — I441 Atrioventricular block, second degree: Secondary | ICD-10-CM | POA: Diagnosis not present

## 2023-08-02 LAB — CUP PACEART REMOTE DEVICE CHECK
Battery Remaining Longevity: 80 mo
Battery Remaining Percentage: 94 %
Battery Voltage: 2.99 V
Brady Statistic AP VP Percent: 90 %
Brady Statistic AP VS Percent: 1 %
Brady Statistic AS VP Percent: 5.4 %
Brady Statistic AS VS Percent: 1 %
Brady Statistic RA Percent Paced: 87 %
Brady Statistic RV Percent Paced: 96 %
Date Time Interrogation Session: 20241231020014
Implantable Lead Connection Status: 753985
Implantable Lead Connection Status: 753985
Implantable Lead Implant Date: 20240702
Implantable Lead Implant Date: 20240702
Implantable Lead Location: 753859
Implantable Lead Location: 753860
Implantable Pulse Generator Implant Date: 20240702
Lead Channel Impedance Value: 350 Ohm
Lead Channel Impedance Value: 400 Ohm
Lead Channel Pacing Threshold Amplitude: 0.75 V
Lead Channel Pacing Threshold Amplitude: 0.75 V
Lead Channel Pacing Threshold Pulse Width: 0.5 ms
Lead Channel Pacing Threshold Pulse Width: 0.5 ms
Lead Channel Sensing Intrinsic Amplitude: 1.3 mV
Lead Channel Sensing Intrinsic Amplitude: 12 mV
Lead Channel Setting Pacing Amplitude: 2 V
Lead Channel Setting Pacing Amplitude: 2.5 V
Lead Channel Setting Pacing Pulse Width: 0.5 ms
Lead Channel Setting Sensing Sensitivity: 2 mV
Pulse Gen Model: 2272
Pulse Gen Serial Number: 5829960

## 2023-08-23 DIAGNOSIS — H04123 Dry eye syndrome of bilateral lacrimal glands: Secondary | ICD-10-CM | POA: Diagnosis not present

## 2023-08-23 DIAGNOSIS — H353131 Nonexudative age-related macular degeneration, bilateral, early dry stage: Secondary | ICD-10-CM | POA: Diagnosis not present

## 2023-08-23 LAB — HM DIABETES EYE EXAM

## 2023-08-25 ENCOUNTER — Ambulatory Visit: Payer: Self-pay | Admitting: Internal Medicine

## 2023-08-25 NOTE — Telephone Encounter (Signed)
Chief Complaint: chills Symptoms: chills, "not feeling well", "maybe has the flu" Frequency: onset of symptoms 2 days ago  Pertinent Negatives: Patient denies fever, N/V/D/abdominal pain, CP, SOB Disposition: [] ED /[] Urgent Care (no appt availability in office) / [x] Appointment(In office/virtual)/ []  [facility]/ [] Home Care/ [] Refused Recommended Disposition /[] [facility]/ []  Follow-up with PCP  Additional Notes: Pt's daughter Regan Rakers called on pt's behalf. Daughter reports pt has had chills and "not feeling well" for 2 days. CRM from the agent states pt has refused to eat today - daughter Regan Rakers states pt has an appetite and has been eating and drinking normally. Daughter reports pt is is urinating and defecating normally. Pt denies N/V/D/abdominal pain, SOB, chest pain, fever. Daughter reports chills are mild. Pt reportedly taking Tylenol and Mucinex, although pt denies congestion and cough. Per protocol/nursing judgment, this RN scheduled an appt tomorrow at 1040. Daughter and pt agreeable to the plan. This RN advised daughter to call back for any worsening symptoms.    Reason for Disposition  Nursing judgment or information in reference  Answer Assessment - Initial Assessment Questions 1. REASON FOR CALL: "What is your main concern right now?"     Chills, not feeling well, "has the flu" 2. ONSET: "When did the chills start?"     2 days ago 3. SEVERITY: "How bad are the chills?"     No shaking. "Normal, I guess." 4. FEVER: "Do you have a fever?"     Have not checked temperature  5. OTHER SYMPTOMS: "Do you have any other new symptoms?"     Denies N/V/D, pt is going to bathroom normally, no CP or SOB 6. TREATMENTS AND RESPONSE: "What have you done so far to try to make this better? What medicines have you used?"     Tylenol and Mucinex  Protocols used: No Guideline Available-A-AH

## 2023-08-25 NOTE — Telephone Encounter (Signed)
Attempt to call back: no answer: left voicemail: will attempt to reach out again.

## 2023-08-25 NOTE — Telephone Encounter (Signed)
Message from [place] J sent at 08/25/2023  1:54 PM EST  Copied From CRM [id number]. Reason for Triage: Patient's daughter Regan Rakers called in to try to schedule appointment. Patient has refused to eat today. He is also experiencing chills and is appears weak and fatigued. He did have fever 2 days ago. No respiratory symptoms, no pain. CB# [phone] (daughter, Regan Rakers)

## 2023-08-26 ENCOUNTER — Encounter: Payer: Self-pay | Admitting: Family Medicine

## 2023-08-26 ENCOUNTER — Ambulatory Visit (INDEPENDENT_AMBULATORY_CARE_PROVIDER_SITE_OTHER): Payer: Medicare HMO | Admitting: Family Medicine

## 2023-08-26 VITALS — BP 137/59 | HR 70 | Temp 98.1°F | Ht 67.0 in | Wt 193.0 lb

## 2023-08-26 DIAGNOSIS — R6883 Chills (without fever): Secondary | ICD-10-CM | POA: Diagnosis not present

## 2023-08-26 DIAGNOSIS — J069 Acute upper respiratory infection, unspecified: Secondary | ICD-10-CM

## 2023-08-26 DIAGNOSIS — R051 Acute cough: Secondary | ICD-10-CM

## 2023-08-26 LAB — POCT INFLUENZA A/B
Influenza A, POC: NEGATIVE
Influenza B, POC: NEGATIVE

## 2023-08-26 LAB — POC COVID19 BINAXNOW: SARS Coronavirus 2 Ag: NEGATIVE

## 2023-08-26 MED ORDER — BENZONATATE 100 MG PO CAPS: 100.0000 mg | ORAL_CAPSULE | Freq: Two times a day (BID) | ORAL | 0 refills | Status: DC | PRN

## 2023-08-26 NOTE — Patient Instructions (Signed)
Flu and COVID tests are negative today Likely Viral Upper Respiratory Infection  I will send in some cough pearls for you. Continue supportive measures including rest, hydration, humidifier use, steam showers, warm compresses to sinuses, warm liquids with lemon and honey, and over-the-counter cough, cold, and analgesics as needed. If symptoms persist 8-10 days, become severe, or return after a few days of feeling better, then please follow-up for repeat evaluation to determine if antibiotics may be necessary.  Over the counter medications that may be helpful for symptoms:  Guaifenesin 1200 mg extended release tabs twice daily, with plenty of water For cough and congestion Brand name: Mucinex   Pseudoephedrine 30 mg, one or two tabs every 4 to 6 hours For sinus congestion Brand name: Sudafed You must get this from the pharmacy counter.  Oxymetazoline nasal spray each morning, one spray in each nostril, for NO MORE THAN 3 days  For nasal and sinus congestion Brand name: Afrin Saline nasal spray or Saline Nasal Irrigation (Netti Pot, etc) 3-5 times a day For nasal and sinus congestion Brand names: Ocean or AYR Fluticasone nasal spray OR Mometasone nasal spray OR Triamcinolone Acetonide nasal spray - follow directions on the packaging For nasal and sinus congestion Brand name: Flonase, Nasonex, Nasacort Warm salt water gargles  For sore throat Every few hours as needed Alternate ibuprofen 400-600 mg and acetaminophen 1000 mg every 6 hours For fever, body aches, headache Brand names: Motrin or Advil and Tylenol Dextromethorphan 12-hour cough version 30 mg every 12 hours  For cough Brand name: Delsym Stop all other cold medications for now (Nyquil, Dayquil, Tylenol Cold, Theraflu, etc) and other non-prescription cough/cold preparations. Many of these have the same ingredients listed above and could cause an overdose of medication.   Herbal treatments that have been shown to be helpful in  some patients include: Vitamin C 1000 mg per day Zinc 100 mg per day Quercetin 25-500 mg twice a day Melatonin 5-10mg  at bedtime Honey Green Tea  General Instructions Allow your body to rest Drink PLENTY of fluids Typically, we are the most contagious 1-2 days before symptoms start through the first 2-3 days of most severe symptoms. Per CDC guidelines, you can return to school/work when symptoms have started to improve and you have been fever-free for 24 hours. However, recommend you continue extra precautions for the following 5 days (frequent hand hygiene, masking, covering coughs/sneezes, minimize exposure to immunocompromised individuals, etc).  If you develop severe shortness of breath, uncontrolled fevers, coughing up blood, confusion, chest pain, or signs of dehydration (such as significantly decreased urine amounts or dizziness with standing) please go to the nearest ER.

## 2023-08-26 NOTE — Progress Notes (Signed)
Acute Office Visit  Subjective:     Patient ID: Maurice Colon, male    DOB: 16-Nov-1932, 89 y.o.   MRN: [id number]  Chief Complaint  Patient presents with   Chills    HPI Patient is in today for URI symptoms.     Discussed the use of AI scribe software for clinical note transcription with the patient, who gave verbal consent to proceed.  History of Present Illness   The patient, accompanied by his daughter, presents with a chief complaint of chills, fever, and chest congestion. He describes feeling generally weak and tired, with symptoms worsening at night. He has not been around anyone who has been sick recently and denies any sore throat or headache. No urinary symptoms.   The patient reports a sensation of chest congestion but denies any difficulty breathing. He occasionally coughs, usually non-productive, and some rhinorrhea.   The patient has been managing his symptoms with Tylenol and Mucinex, which he reports has been helpful. He denies any rashes or pain on examination.              ROS All review of systems negative except what is listed in the HPI      Objective:    BP (!) 137/59   Pulse 70   Temp 98.1 F (36.7 C) (Oral)   Ht 5\' 7"  (1.702 m)   Wt 193 lb (87.5 kg)   SpO2 96%   BMI 30.23 kg/m    Physical Exam Vitals reviewed.  Constitutional:      Appearance: Normal appearance.  HENT:     Head: Normocephalic and atraumatic.     Right Ear: Tympanic membrane normal.     Left Ear: There is impacted cerumen.     Nose: Rhinorrhea present.     Mouth/Throat:     Mouth: Mucous membranes are moist.  Cardiovascular:     Rate and Rhythm: Normal rate and regular rhythm.  Pulmonary:     Effort: Pulmonary effort is normal.     Breath sounds: Normal breath sounds. No wheezing, rhonchi or rales.  Musculoskeletal:     Cervical back: Normal range of motion and neck supple. No tenderness.  Lymphadenopathy:     Cervical: No cervical adenopathy.   Skin:    General: Skin is warm and dry.  Neurological:     Mental Status: He is alert and oriented to person, place, and time.  Psychiatric:        Mood and Affect: Mood normal.        Behavior: Behavior normal.        Thought Content: Thought content normal.        Judgment: Judgment normal.       Results for orders placed or performed in visit on 08/26/23  POC COVID-19 BinaxNow  Result Value Ref Range   SARS Coronavirus 2 Ag Negative Negative  POCT Influenza A/B  Result Value Ref Range   Influenza A, POC Negative Negative   Influenza B, POC Negative Negative        Assessment & Plan:   Problem List Items Addressed This Visit   None Visit Diagnoses       Viral URI    -  Primary   Relevant Medications   benzonatate (TESSALON) 100 MG capsule     Acute cough       Relevant Orders   POC COVID-19 BinaxNow (Completed)   POCT Influenza A/B (Completed)     Chills  Relevant Orders   POC COVID-19 BinaxNow (Completed)   POCT Influenza A/B (Completed)     Flu and COVID tests are negative today Likely Viral Upper Respiratory Infection  I will send in some cough pearls for you. Continue supportive measures including rest, hydration, humidifier use, steam showers, warm compresses to sinuses, warm liquids with lemon and honey, and over-the-counter cough, cold, and analgesics as needed. If symptoms persist 8-10 days, become severe, or return after a few days of feeling better, then please follow-up for repeat evaluation to determine if antibiotics may be necessary.   Patient aware of signs/symptoms requiring further/urgent evaluation.    Meds ordered this encounter  Medications   benzonatate (TESSALON) 100 MG capsule    Sig: Take 1 capsule (100 mg total) by mouth 2 (two) times daily as needed for cough.    Dispense:  20 capsule    Refill:  0    Supervising Provider:   Danise Edge A [[id number]]    Return if symptoms worsen or fail to improve.  Clayborne Dana,  NP

## 2023-09-07 ENCOUNTER — Encounter: Payer: Self-pay | Admitting: Internal Medicine

## 2023-09-07 ENCOUNTER — Ambulatory Visit (INDEPENDENT_AMBULATORY_CARE_PROVIDER_SITE_OTHER): Payer: Medicare HMO | Admitting: Internal Medicine

## 2023-09-07 VITALS — BP 136/60 | HR 79 | Temp 98.0°F | Resp 18 | Ht 67.0 in | Wt 193.1 lb

## 2023-09-07 DIAGNOSIS — M150 Primary generalized (osteo)arthritis: Secondary | ICD-10-CM

## 2023-09-07 DIAGNOSIS — I10 Essential (primary) hypertension: Secondary | ICD-10-CM | POA: Diagnosis not present

## 2023-09-07 DIAGNOSIS — E119 Type 2 diabetes mellitus without complications: Secondary | ICD-10-CM

## 2023-09-07 NOTE — Patient Instructions (Addendum)
  Next visit with me  in 3 months  Please schedule it at the front desk       Aparentemente ya es tiempo de hacerse un examen de los ojos. Por favor llame a su doctor de los ojos (ophthalmologist, optometrist) y saque una cita. Solicite que nos envien una copia de la visita a nuestro fax: [phone]. Si necesitara un referral nosotros lo podemos hacer

## 2023-09-07 NOTE — Progress Notes (Signed)
 Subjective:    Patient ID: Maurice Colon, male    DOB: 1932/11/13, 89 y.o.   MRN: [id number]  DOS:  09/07/2023 Type of visit - description: f/u, here with his daughter  See last visit, complained  of chest pain, that quickly resolved, now is able to do all his ADLs without chest pain, difficulty breathing or any problems. Also recently had a URI, doing much better. Insomnia improved, hardly ever takes Benadryl.   Review of Systems See above   Past Medical History:  Diagnosis Date   Bradycardia    DJD (degenerative joint disease)    Hyperlipidemia    Hypertriglyceridemia   Hypertension    Hypogonadism, male    Osteoporosis    Prediabetes 07/16/2013   Rosacea     Past Surgical History:  Procedure Laterality Date   CATARACT EXTRACTION     Bilaterally   INGUINAL HERNIA REPAIR     PACEMAKER IMPLANT N/A 02/01/2023   Procedure: PACEMAKER IMPLANT;  Surgeon: Nancey Eulas BRAVO, MD;  Location: [facility];  Service: Cardiovascular;  Laterality: N/A;    Current Outpatient Medications  Medication Instructions   acetaminophen  (TYLENOL ) 500 mg, Every 6 hours PRN   aspirin EC 81 mg, Daily   atorvastatin  (LIPITOR) 10 mg, Oral, Daily   augmented betamethasone  dipropionate (DIPROLENE -AF) 0.05 % cream Topical, 2 times daily PRN   benzonatate  (TESSALON ) 100 mg, Oral, 2 times daily PRN   diphenhydrAMINE (BENADRYL) 25 mg, At bedtime PRN   losartan -hydrochlorothiazide  (HYZAAR) 100-25 MG tablet 1 tablet, Oral, Daily   meloxicam  (MOBIC ) 7.5-15 mg, Oral, Daily PRN   nitroGLYCERIN  (NITROSTAT ) 0.4 mg, Sublingual, Every 5 min x3 PRN   potassium chloride  (KLOR-CON  M) 10 MEQ tablet 10 mEq, Oral, Daily   terazosin  (HYTRIN ) 1 mg, Oral, Daily at bedtime       Objective:   Physical Exam BP 136/60   Pulse 79   Temp 98 F (36.7 C) (Oral)   Resp 18   Ht 5' 7 (1.702 m)   Wt 193 lb 2 oz (87.6 kg)   SpO2 97%   BMI 30.25 kg/m  General:   Well developed, NAD, BMI  noted. HEENT:  Normocephalic . Face symmetric, atraumatic Lungs:  CTA B Normal respiratory effort, no intercostal retractions, no accessory muscle use. Heart: RRR,  no murmur.  DM foot exam.  Good pedal pulses, toes well-perfused, pinprick examination normal Skin: Not pale. Not jaundice Neurologic:  alert & oriented X3.  Speech normal, gait appropriate for age and unassisted Psych--  Cognition and judgment appear intact.  Cooperative with normal attention span and concentration.  Behavior appropriate. No anxious or depressed appearing.      Assessment    Problem list Diabetes: tried metformin , see note 08-30-17, has s/e, ok to try again if needed (low dose) HTN: amlodipine: edema  Dyslipidemia,  high triglycerides Hypogonadism- was unable to afford HRT 2012, no sx  Osteoporosis: T score -2.4  (05-2012). T score -2.3 (05-2016), DEXA 01-2020, T score better at -1.9. Vitamin D  deficiency DJD-- meloxicam  prn Rosacea Increase LFTs -- First noted ~ 04-2015, no etoh/tylenol . changed simvastatin  to Lipitor 04-2014 and Lipitor was d/c 2-17. We also d/c fenofibrates 09-2015; LFTs back to normal  01-2016  Fatty liver per [place]  07-18-2015 Hep B-C  Neg 07-2015;  ceruloplasmin, ferritin, iron and alpha-1 antitrypsin ---> (-) 09-2015 Pacemaker 01-2023.  Dx AV block with Wenckebach    PLAN: Stable angina: See LOV, was referred to cardiology, the patient never set  up the appointment.  He also reports that shortly after the visit, the chest pain resolved and now is able to do all his ADLs without problems, he is not inclined to see cardiology at this point.  We agreed on observation, if CP returns >>take nitroglycerin  x 3 and call 911 if needed. DM: Well-controlled per last A1c, diet controlled HTN: Ambulatory BPs WNL per patient, BP today is very good, continue Hyzaar, potassium, terazosin . DJD: Takes meloxicam  sporadically. Insomnia: Improved, takes Benadryl as needed only Preventive care: Had a flu  shot, reports he also had a recent COVID-vaccine. RTC 3 months

## 2023-09-08 ENCOUNTER — Encounter: Payer: Self-pay | Admitting: Internal Medicine

## 2023-09-08 NOTE — Assessment & Plan Note (Signed)
 Stable angina: See LOV, was referred to cardiology, the patient never set up the appointment.  He also reports that shortly after the visit, the chest pain resolved and now is able to do all his ADLs without problems, he is not inclined to see cardiology at this point.  We agreed on observation, if CP returns >>take nitroglycerin  x 3 and call 911 if needed. DM: Well-controlled per last A1c, diet controlled HTN: Ambulatory BPs WNL per patient, BP today is very good, continue Hyzaar, potassium, terazosin . DJD: Takes meloxicam  sporadically. Insomnia: Improved, takes Benadryl as needed only Preventive care: Had a flu shot, reports he also had a recent COVID-vaccine. RTC 3 months

## 2023-09-09 ENCOUNTER — Other Ambulatory Visit: Payer: Self-pay | Admitting: Internal Medicine

## 2023-09-13 NOTE — Progress Notes (Signed)
Remote pacemaker transmission.

## 2023-11-01 ENCOUNTER — Ambulatory Visit (INDEPENDENT_AMBULATORY_CARE_PROVIDER_SITE_OTHER): Payer: Medicare HMO

## 2023-11-01 DIAGNOSIS — I441 Atrioventricular block, second degree: Secondary | ICD-10-CM | POA: Diagnosis not present

## 2023-11-01 LAB — CUP PACEART REMOTE DEVICE CHECK
Battery Remaining Longevity: 77 mo
Battery Remaining Percentage: 91 %
Battery Voltage: 2.99 V
Brady Statistic AP VP Percent: 92 %
Brady Statistic AP VS Percent: 1 %
Brady Statistic AS VP Percent: 4.7 %
Brady Statistic AS VS Percent: 1 %
Brady Statistic RA Percent Paced: 89 %
Brady Statistic RV Percent Paced: 96 %
Date Time Interrogation Session: 20250401020020
Implantable Lead Connection Status: 753985
Implantable Lead Connection Status: 753985
Implantable Lead Implant Date: 20240702
Implantable Lead Implant Date: 20240702
Implantable Lead Location: 753859
Implantable Lead Location: 753860
Implantable Pulse Generator Implant Date: 20240702
Lead Channel Impedance Value: 360 Ohm
Lead Channel Impedance Value: 400 Ohm
Lead Channel Pacing Threshold Amplitude: 0.75 V
Lead Channel Pacing Threshold Amplitude: 0.75 V
Lead Channel Pacing Threshold Pulse Width: 0.5 ms
Lead Channel Pacing Threshold Pulse Width: 0.5 ms
Lead Channel Sensing Intrinsic Amplitude: 0.7 mV
Lead Channel Sensing Intrinsic Amplitude: 12 mV
Lead Channel Setting Pacing Amplitude: 2 V
Lead Channel Setting Pacing Amplitude: 2.5 V
Lead Channel Setting Pacing Pulse Width: 0.5 ms
Lead Channel Setting Sensing Sensitivity: 2 mV
Pulse Gen Model: 2272
Pulse Gen Serial Number: 5829960

## 2023-11-21 ENCOUNTER — Other Ambulatory Visit: Payer: Self-pay | Admitting: Internal Medicine

## 2023-12-05 ENCOUNTER — Ambulatory Visit (INDEPENDENT_AMBULATORY_CARE_PROVIDER_SITE_OTHER): Payer: Medicare HMO | Admitting: Internal Medicine

## 2023-12-05 ENCOUNTER — Encounter: Payer: Self-pay | Admitting: Internal Medicine

## 2023-12-05 VITALS — BP 138/86 | HR 83 | Temp 98.3°F | Resp 16 | Ht 67.0 in | Wt 197.4 lb

## 2023-12-05 DIAGNOSIS — R609 Edema, unspecified: Secondary | ICD-10-CM

## 2023-12-05 DIAGNOSIS — I10 Essential (primary) hypertension: Secondary | ICD-10-CM

## 2023-12-05 DIAGNOSIS — E785 Hyperlipidemia, unspecified: Secondary | ICD-10-CM

## 2023-12-05 DIAGNOSIS — E119 Type 2 diabetes mellitus without complications: Secondary | ICD-10-CM

## 2023-12-05 LAB — BASIC METABOLIC PANEL WITH GFR
BUN: 21 mg/dL (ref 6–23)
CO2: 27 meq/L (ref 19–32)
Calcium: 9.3 mg/dL (ref 8.4–10.5)
Chloride: 102 meq/L (ref 96–112)
Creatinine, Ser: 1.21 mg/dL (ref 0.40–1.50)
GFR: 53.43 mL/min — ABNORMAL LOW (ref >60.00)
Glucose, Bld: 86 mg/dL (ref 70–99)
Potassium: 4.1 meq/L (ref 3.5–5.1)
Sodium: 138 meq/L (ref 135–145)

## 2023-12-05 LAB — HEMOGLOBIN A1C: Hgb A1c MFr Bld: 6.1 % (ref 4.6–6.5)

## 2023-12-05 NOTE — Patient Instructions (Addendum)
 INSTRUCTIONS  FOR TODAY   Continue checking your blood pressure regularly Blood pressure goal:  between 110/65 and  135/85. If it is consistently higher or lower, let me know     GO TO THE LAB : Get the blood work     Next office visit for a physical exam by November 2025 Please make an appointment before you leave today

## 2023-12-05 NOTE — Progress Notes (Unsigned)
 Subjective:    Patient ID: Maurice Colon, male    DOB: 13-May-1933, 89 y.o.   MRN: [id number]  DOS:  12/05/2023 Type of visit - description: Routine follow-up, here with his daughter  In general feeling well.  Occasionally has cough when he lays back. Denies nasal discharge or congestion.  Other than that denies chest pain, difficulty breathing.  No orthopnea, sleeps well. I noted some edema, states that that has been the case for a while and is now getting worse.   Wt Readings from Last 3 Encounters:  12/05/23 197 lb 6 oz (89.5 kg)  09/07/23 193 lb 2 oz (87.6 kg)  08/26/23 193 lb (87.5 kg)     Review of Systems See above   Past Medical History:  Diagnosis Date   Bradycardia    DJD (degenerative joint disease)    Hyperlipidemia    Hypertriglyceridemia   Hypertension    Hypogonadism, male    Osteoporosis    Prediabetes 07/16/2013   Rosacea     Past Surgical History:  Procedure Laterality Date   CATARACT EXTRACTION     Bilaterally   INGUINAL HERNIA REPAIR     PACEMAKER IMPLANT N/A 02/01/2023   Procedure: PACEMAKER IMPLANT;  Surgeon: Efraim Grange, MD;  Location: [facility];  Service: Cardiovascular;  Laterality: N/A;    Current Outpatient Medications  Medication Instructions   acetaminophen  (TYLENOL ) 500 mg, Every 6 hours PRN   aspirin EC 81 mg, Daily   atorvastatin  (LIPITOR) 10 mg, Oral, Daily   augmented betamethasone  dipropionate (DIPROLENE -AF) 0.05 % cream Topical, 2 times daily PRN   diphenhydrAMINE (BENADRYL) 25 mg, At bedtime PRN   losartan -hydrochlorothiazide  (HYZAAR) 100-25 MG tablet 1 tablet, Oral, Daily   meloxicam  (MOBIC ) 7.5-15 mg, Oral, Daily PRN   nitroGLYCERIN  (NITROSTAT ) 0.4 mg, Sublingual, Every 5 min x3 PRN   potassium chloride  (KLOR-CON  M) 10 MEQ tablet 10 mEq, Oral, Daily   terazosin  (HYTRIN ) 1 mg, Oral, Daily at bedtime       Objective:   Physical Exam BP 138/86   Pulse 83   Temp 98.3 F (36.8 C) (Oral)    Resp 16   Ht 5\' 7"  (1.702 m)   Wt 197 lb 6 oz (89.5 kg)   SpO2 93%   BMI 30.91 kg/m  General:   Well developed, NAD, BMI noted. HEENT:  Normocephalic . Face symmetric, atraumatic Neck: No JVD at 45 degrees Lungs:  CTA B Normal respiratory effort, no intercostal retractions, no accessory muscle use. Heart: RRR,  no murmur.  Lower extremities: +/+++ peri ankle edema bilaterally  Skin: Not pale. Not jaundice Neurologic:  alert & oriented X3.  Speech normal, gait appropriate for age and unassisted Psych--  Cognition and judgment appear intact.  Cooperative with normal attention span and concentration.  Behavior appropriate. No anxious or depressed appearing.      Assessment   Problem list Diabetes: tried metformin , see note 08-30-17, has s/e, ok to try again if needed (low dose) HTN: amlodipine: edema  Dyslipidemia,  high triglycerides Hypogonadism- was unable to afford HRT 2012, no sx  Osteoporosis: T score -2.4  (05-2012). T score -2.3 (05-2016), DEXA 01-2020, T score better at -1.9. Vitamin D  deficiency DJD-- meloxicam  prn Rosacea Increase LFTs -- First noted ~ 04-2015, no etoh/tylenol . changed simvastatin  to Lipitor 04-2014 and Lipitor was d/c 2-17. We also d/c fenofibrates 09-2015; LFTs back to normal  01-2016  Fatty liver per [place]  07-18-2015 Hep B-C  Neg 07-2015;  ceruloplasmin,  ferritin, iron and alpha-1 antitrypsin ---> (-) 09-2015 Pacemaker 01-2023.  Dx AV block with Wenckebach    PLAN: DM: Diet controlled, check A1c. HTN: On Hyzaar, potassium and terazosin , ambulatory BPs in the 130s.  Check BMP Edema: Some lower extremity edema noted, has gained few pounds. Denies chest pain, difficulty breathing or orthopnea.  EF normal on echo May 2024.  Recommend low-salt diet, leg elevation, call if worse. DJD: On meloxicam  sporadically, reminded to take with food. RTC 6 months CPX.  2/25 Stable angina: See LOV, was referred to cardiology, the patient never set up the appointment.   He also reports that shortly after the visit, the chest pain resolved and now is able to do all his ADLs without problems, he is not inclined to see cardiology at this point.  We agreed on observation, if CP returns >>take nitroglycerin  x 3 and call 911 if needed. DM: Well-controlled per last A1c, diet controlled HTN: Ambulatory BPs WNL per patient, BP today is very good, continue Hyzaar, potassium, terazosin . DJD: Takes meloxicam  sporadically. Insomnia: Improved, takes Benadryl as needed only Preventive care: Had a flu shot, reports he also had a recent COVID-vaccine. RTC 3 months

## 2023-12-06 DIAGNOSIS — R609 Edema, unspecified: Secondary | ICD-10-CM | POA: Insufficient documentation

## 2023-12-06 NOTE — Assessment & Plan Note (Signed)
 DM: Diet controlled, check A1c. HTN: On Hyzaar, potassium and terazosin , ambulatory BPs in the 130s.  Check BMP Edema: Some lower extremity edema noted, has gained few pounds. Denies chest pain, difficulty breathing or orthopnea.  EF normal on echo May 2024.  Recommend low-salt diet, leg elevation, call if worse. DJD: On meloxicam  sporadically, reminded to take with food. RTC 6 months CPX.

## 2023-12-14 NOTE — Progress Notes (Signed)
 Remote pacemaker transmission.

## 2024-01-31 ENCOUNTER — Ambulatory Visit (INDEPENDENT_AMBULATORY_CARE_PROVIDER_SITE_OTHER): Payer: Medicare HMO

## 2024-01-31 DIAGNOSIS — I441 Atrioventricular block, second degree: Secondary | ICD-10-CM | POA: Diagnosis not present

## 2024-01-31 LAB — CUP PACEART REMOTE DEVICE CHECK
Battery Remaining Longevity: 74 mo
Battery Remaining Percentage: 87 %
Battery Voltage: 2.99 V
Brady Statistic AP VP Percent: 91 %
Brady Statistic AP VS Percent: 1 %
Brady Statistic AS VP Percent: 4.6 %
Brady Statistic AS VS Percent: 1 %
Brady Statistic RA Percent Paced: 88 %
Brady Statistic RV Percent Paced: 96 %
Date Time Interrogation Session: 20250701020015
Implantable Lead Connection Status: 753985
Implantable Lead Connection Status: 753985
Implantable Lead Implant Date: 20240702
Implantable Lead Implant Date: 20240702
Implantable Lead Location: 753859
Implantable Lead Location: 753860
Implantable Pulse Generator Implant Date: 20240702
Lead Channel Impedance Value: 360 Ohm
Lead Channel Impedance Value: 400 Ohm
Lead Channel Pacing Threshold Amplitude: 0.75 V
Lead Channel Pacing Threshold Amplitude: 0.75 V
Lead Channel Pacing Threshold Pulse Width: 0.5 ms
Lead Channel Pacing Threshold Pulse Width: 0.5 ms
Lead Channel Sensing Intrinsic Amplitude: 0.8 mV
Lead Channel Sensing Intrinsic Amplitude: 12 mV
Lead Channel Setting Pacing Amplitude: 2 V
Lead Channel Setting Pacing Amplitude: 2.5 V
Lead Channel Setting Pacing Pulse Width: 0.5 ms
Lead Channel Setting Sensing Sensitivity: 2 mV
Pulse Gen Model: 2272
Pulse Gen Serial Number: 5829960

## 2024-02-03 ENCOUNTER — Other Ambulatory Visit: Payer: Self-pay | Admitting: Internal Medicine

## 2024-02-05 ENCOUNTER — Ambulatory Visit: Payer: Self-pay | Admitting: Cardiovascular Disease

## 2024-02-22 ENCOUNTER — Other Ambulatory Visit: Payer: Self-pay | Admitting: Family

## 2024-02-22 DIAGNOSIS — E785 Hyperlipidemia, unspecified: Secondary | ICD-10-CM

## 2024-04-20 ENCOUNTER — Other Ambulatory Visit: Payer: Self-pay | Admitting: Internal Medicine

## 2024-05-01 ENCOUNTER — Ambulatory Visit (INDEPENDENT_AMBULATORY_CARE_PROVIDER_SITE_OTHER): Payer: Medicare HMO

## 2024-05-01 DIAGNOSIS — I441 Atrioventricular block, second degree: Secondary | ICD-10-CM | POA: Diagnosis not present

## 2024-05-03 LAB — CUP PACEART REMOTE DEVICE CHECK
Battery Remaining Longevity: 71 mo
Battery Remaining Percentage: 84 %
Battery Voltage: 2.99 V
Brady Statistic AP VP Percent: 91 %
Brady Statistic AP VS Percent: 1 %
Brady Statistic AS VP Percent: 4.7 %
Brady Statistic AS VS Percent: 1 %
Brady Statistic RA Percent Paced: 87 %
Brady Statistic RV Percent Paced: 95 %
Date Time Interrogation Session: 20250930020016
Implantable Lead Connection Status: 753985
Implantable Lead Connection Status: 753985
Implantable Lead Implant Date: 20240702
Implantable Lead Implant Date: 20240702
Implantable Lead Location: 753859
Implantable Lead Location: 753860
Implantable Pulse Generator Implant Date: 20240702
Lead Channel Impedance Value: 360 Ohm
Lead Channel Impedance Value: 400 Ohm
Lead Channel Pacing Threshold Amplitude: 0.75 V
Lead Channel Pacing Threshold Amplitude: 0.75 V
Lead Channel Pacing Threshold Pulse Width: 0.5 ms
Lead Channel Pacing Threshold Pulse Width: 0.5 ms
Lead Channel Sensing Intrinsic Amplitude: 1.3 mV
Lead Channel Sensing Intrinsic Amplitude: 12 mV
Lead Channel Setting Pacing Amplitude: 2 V
Lead Channel Setting Pacing Amplitude: 2.5 V
Lead Channel Setting Pacing Pulse Width: 0.5 ms
Lead Channel Setting Sensing Sensitivity: 2 mV
Pulse Gen Model: 2272
Pulse Gen Serial Number: 5829960

## 2024-05-03 NOTE — Progress Notes (Signed)
 Remote PPM Transmission

## 2024-05-10 ENCOUNTER — Telehealth: Payer: Self-pay | Admitting: Internal Medicine

## 2024-05-10 NOTE — Telephone Encounter (Signed)
 Copied from CRM #[id number]. Topic: Medicare AWV >> May 10, 2024 11:00 AM Nathanel DEL wrote: Reason for CRM: Called LVM 05/10/2024 to schedule AWV. Please schedule office or virtual visits.  Nathanel Paschal; Care Guide Ambulatory Clinical Support [place] [facility] Direct Dial: [phone]

## 2024-05-10 NOTE — Progress Notes (Signed)
 Remote PPM Transmission

## 2024-05-14 ENCOUNTER — Ambulatory Visit: Payer: Self-pay

## 2024-05-14 ENCOUNTER — Ambulatory Visit: Payer: Self-pay | Admitting: Cardiovascular Disease

## 2024-05-14 DIAGNOSIS — I447 Left bundle-branch block, unspecified: Secondary | ICD-10-CM | POA: Diagnosis not present

## 2024-05-14 DIAGNOSIS — Z950 Presence of cardiac pacemaker: Secondary | ICD-10-CM | POA: Diagnosis not present

## 2024-05-14 DIAGNOSIS — E785 Hyperlipidemia, unspecified: Secondary | ICD-10-CM | POA: Diagnosis not present

## 2024-05-14 DIAGNOSIS — R600 Localized edema: Secondary | ICD-10-CM | POA: Diagnosis not present

## 2024-05-14 DIAGNOSIS — R2243 Localized swelling, mass and lump, lower limb, bilateral: Secondary | ICD-10-CM | POA: Diagnosis not present

## 2024-05-14 DIAGNOSIS — I10 Essential (primary) hypertension: Secondary | ICD-10-CM | POA: Diagnosis not present

## 2024-05-14 NOTE — Telephone Encounter (Signed)
 Patient's daughter called in for triage. Duplicate, see nurse triage encounter.

## 2024-05-14 NOTE — Telephone Encounter (Signed)
 FYI Only or Action Required?: FYI only for provider.  Patient was last seen in primary care on 12/05/2023 by Maurice Aloysius BRAVO, MD.  Called Nurse Triage reporting Hypertension and Foot Swelling.  Symptoms began several days ago.  Interventions attempted: Prescription medications: BP med.  Symptoms are: hypertension, bilateral feet swelling, not sleeping well/insomnia gradually worsening.  Triage Disposition: See HCP Within 4 Hours (Or PCP Triage)  Patient/caregiver understands and will follow disposition?: Unsure          Copied from CRM [id number]. Topic: Clinical - Red Word Triage >> May 14, 2024 10:46 AM Carlyon D wrote: Red Word that prompted transfer to Nurse Triage: Daughter Mliss calling for pt BP 200/107, pt has swollen feet. Reason for Disposition  [1] Systolic BP >= 200 OR Diastolic >= 120 AND [2] having NO cardiac or neurologic symptoms  Answer Assessment - Initial Assessment Questions Patient's daughter calling in for triage. Difficulty with daughter for assessment and scheduling; during assessment daughter keeps repeating that she just needs an appointment with Dr Maurice and is brief/short with RN. She states she wants to be seen only by Dr Maurice. No availability. She then states she would like to only see MD's. No availability. Refusing other office locations. She states she will call the patient and ask him what he would prefer to do and she will call back.  1. BLOOD PRESSURE: What is your blood pressure? Did you take at least two measurements 5 minutes apart?     200/107  2. ONSET: When did you take your blood pressure?     This morning, unsure what time.  3. HOW: How did you take your blood pressure? (e.g., automatic home BP monitor, visiting nurse)     Automatic home BP monitor.  4. HISTORY: Do you have a history of high blood pressure?     Yes.  5. MEDICINES: Are you taking any medicines for blood pressure? Have you missed any doses recently?     Yes,  losartan -hydrochlorothiazide  and she is unsure what time he took the medication. No missed doses.  6. OTHER SYMPTOMS: Do you have any symptoms? (e.g., blurred vision, chest pain, difficulty breathing, headache, weakness)     Not sleeping well, bilateral feet swelling x 2- 3 days. Denies chest pain, SOB, unilateral numbness or weakness, dizziness, changes in speech or vision.  7. PREGNANCY: Is there any chance you are pregnant? When was your last menstrual period?     N/A.  Protocols used: Blood Pressure - High-A-AH

## 2024-05-14 NOTE — Telephone Encounter (Signed)
 Pt's sister called in per PAS stating pt's BP was elevated and she advised him to go to ED, pt declined. This RN attempted to call pt via Spanish interpreter, LVM for return call.    Copied from CRM [id number]. Topic: Clinical - Red Word Triage >> May 14, 2024 10:04 AM Macario HERO wrote: Red Word that prompted transfer to Nurse Triage: Patient sister in law called but not listed. Stated patient speak spanish and his blood pressure is high 200/107. - Advise would not be able to speak with her but will report and she advised that she will have his wife call.

## 2024-06-01 ENCOUNTER — Other Ambulatory Visit: Payer: Self-pay

## 2024-06-01 ENCOUNTER — Encounter (HOSPITAL_COMMUNITY): Payer: Self-pay

## 2024-06-01 ENCOUNTER — Emergency Department (HOSPITAL_COMMUNITY)

## 2024-06-01 ENCOUNTER — Emergency Department (HOSPITAL_COMMUNITY)
Admission: EM | Admit: 2024-06-01 | Discharge: 2024-06-01 | Disposition: A | Discharge status: Home / Self Care | Attending: Emergency Medicine | Admitting: Emergency Medicine

## 2024-06-01 DIAGNOSIS — Z7982 Long term (current) use of aspirin: Secondary | ICD-10-CM | POA: Diagnosis not present

## 2024-06-01 DIAGNOSIS — R112 Nausea with vomiting, unspecified: Secondary | ICD-10-CM | POA: Diagnosis not present

## 2024-06-01 DIAGNOSIS — R0602 Shortness of breath: Secondary | ICD-10-CM | POA: Diagnosis not present

## 2024-06-01 DIAGNOSIS — R600 Localized edema: Secondary | ICD-10-CM | POA: Diagnosis not present

## 2024-06-01 DIAGNOSIS — I517 Cardiomegaly: Secondary | ICD-10-CM | POA: Diagnosis not present

## 2024-06-01 DIAGNOSIS — R531 Weakness: Secondary | ICD-10-CM | POA: Diagnosis not present

## 2024-06-01 DIAGNOSIS — M79604 Pain in right leg: Secondary | ICD-10-CM | POA: Diagnosis present

## 2024-06-01 DIAGNOSIS — E785 Hyperlipidemia, unspecified: Secondary | ICD-10-CM

## 2024-06-01 DIAGNOSIS — R42 Dizziness and giddiness: Secondary | ICD-10-CM | POA: Insufficient documentation

## 2024-06-01 DIAGNOSIS — R079 Chest pain, unspecified: Secondary | ICD-10-CM | POA: Diagnosis not present

## 2024-06-01 DIAGNOSIS — Z950 Presence of cardiac pacemaker: Secondary | ICD-10-CM | POA: Diagnosis not present

## 2024-06-01 DIAGNOSIS — I700 Atherosclerosis of aorta: Secondary | ICD-10-CM | POA: Diagnosis not present

## 2024-06-01 DIAGNOSIS — R0989 Other specified symptoms and signs involving the circulatory and respiratory systems: Secondary | ICD-10-CM | POA: Diagnosis not present

## 2024-06-01 LAB — CBC
HCT: 43.1 % (ref 39.0–52.0)
Hemoglobin: 14.3 g/dL (ref 13.0–17.0)
MCH: 30.8 pg (ref 26.0–34.0)
MCHC: 33.2 g/dL (ref 30.0–36.0)
MCV: 92.9 fL (ref 80.0–100.0)
Platelets: 236 K/uL (ref 150–400)
RBC: 4.64 MIL/uL (ref 4.22–5.81)
RDW: 12.8 % (ref 11.5–15.5)
WBC: 4.9 K/uL (ref 4.0–10.5)
nRBC: 0 % (ref 0.0–0.2)

## 2024-06-01 LAB — CBG MONITORING, ED: Glucose-Capillary: 104 mg/dL — ABNORMAL HIGH (ref 70–99)

## 2024-06-01 LAB — COMPREHENSIVE METABOLIC PANEL WITH GFR
ALT: 23 U/L (ref 0–44)
AST: 31 U/L (ref 15–41)
Albumin: 3.9 g/dL (ref 3.5–5.0)
Alkaline Phosphatase: 85 U/L (ref 38–126)
Anion gap: 12 (ref 5–15)
BUN: 20 mg/dL (ref 8–23)
CO2: 24 mmol/L (ref 22–32)
Calcium: 9.2 mg/dL (ref 8.9–10.3)
Chloride: 100 mmol/L (ref 98–111)
Creatinine, Ser: 1.27 mg/dL — ABNORMAL HIGH (ref 0.61–1.24)
GFR, Estimated: 54 mL/min — ABNORMAL LOW (ref >60)
Glucose, Bld: 105 mg/dL — ABNORMAL HIGH (ref 70–99)
Potassium: 4 mmol/L (ref 3.5–5.1)
Sodium: 136 mmol/L (ref 135–145)
Total Bilirubin: 1.3 mg/dL — ABNORMAL HIGH (ref 0.0–1.2)
Total Protein: 7.7 g/dL (ref 6.5–8.1)

## 2024-06-01 LAB — URINALYSIS, ROUTINE W REFLEX MICROSCOPIC
Bilirubin Urine: NEGATIVE
Glucose, UA: NEGATIVE mg/dL
Hgb urine dipstick: NEGATIVE
Ketones, ur: NEGATIVE mg/dL
Leukocytes,Ua: NEGATIVE
Nitrite: NEGATIVE
Protein, ur: NEGATIVE mg/dL
Specific Gravity, Urine: 1.006 (ref 1.005–1.030)
pH: 8 (ref 5.0–8.0)

## 2024-06-01 LAB — TROPONIN I (HIGH SENSITIVITY): Troponin I (High Sensitivity): 15 ng/L

## 2024-06-01 LAB — BRAIN NATRIURETIC PEPTIDE: B Natriuretic Peptide: 133 pg/mL — ABNORMAL HIGH (ref 0.0–100.0)

## 2024-06-01 MED ORDER — FUROSEMIDE 20 MG PO TABS: 20.0000 mg | ORAL_TABLET | Freq: Every day | ORAL | 0 refills | Status: DC

## 2024-06-01 MED ORDER — FUROSEMIDE 20 MG PO TABS
20.0000 mg | ORAL_TABLET | Freq: Once | ORAL | Status: AC
  Administered 2024-06-01: 20 mg via ORAL
  Filled 2024-06-01: qty 1

## 2024-06-01 MED ORDER — ATORVASTATIN CALCIUM 10 MG PO TABS: 10.0000 mg | ORAL_TABLET | Freq: Every day | ORAL | 1 refills | Status: AC

## 2024-06-01 NOTE — ED Provider Notes (Signed)
 [place] EMERGENCY DEPARTMENT AT [facility] Provider Note   CSN: [id number] Arrival date & time: 06/01/24  1035     Patient presents with: Weakness, Dizziness, and Leg Pain   Maurice Colon is a 89 y.o. male.  Interpreter used for entire encounter.  Patient reports he has been experiencing leg heaviness, some weakness, and some lightheadedness with associated depression and anxiety for the last 3 to 4 weeks.  Patient advises the last 6 days his leg heaviness and weakness has gotten worse.  Patient has significant history of AV block type II with pacemaker seen by cardiology.  Patient denies any known history of CHF he does have an appointment on Monday with his doctor for evaluation of leg swelling.  Patient was seen in the ED on 13 October for leg swelling where he was placed on Lasix 40 mg for 7 days with resolution of symptoms.  He was directed to go to his doctor for follow-up and appointment is not until Monday.  Patient denies any chest pain, shortness of breath, or syncope.     Prior to Admission medications   Medication Sig Start Date End Date Taking? Authorizing Provider  furosemide (LASIX) 20 MG tablet Take 1 tablet (20 mg total) by mouth daily. 06/01/24  Yes Myriam Fonda RAMAN, PA-C  acetaminophen  (TYLENOL ) 500 MG tablet Take 500 mg by mouth every 6 (six) hours as needed for moderate pain. Takes 1 or 2 as needed    [provider]  aspirin EC 81 MG tablet Take 81 mg by mouth daily. Swallow whole.    [provider]  atorvastatin  (LIPITOR) 10 MG tablet Take 1 tablet (10 mg total) by mouth daily. 06/01/24   Paz, Jose E, MD  augmented betamethasone  dipropionate (DIPROLENE -AF) 0.05 % cream Apply topically 2 (two) times daily as needed. 04/20/24   Paz, Jose E, MD  diphenhydrAMINE (BENADRYL) 25 mg capsule Take 25 mg by mouth at bedtime as needed for sleep.    [provider]  losartan -hydrochlorothiazide  (HYZAAR) 100-25 MG tablet TAKE 1  TABLET EVERY DAY 05/01/23   Webb, Padonda B, FNP  meloxicam  (MOBIC ) 7.5 MG tablet Take 1-2 tablets (7.5-15 mg total) by mouth daily as needed for pain. 02/06/24   Amon Aloysius BRAVO, MD  nitroGLYCERIN  (NITROSTAT ) 0.4 MG SL tablet Place 1 tablet (0.4 mg total) under the tongue every 5 (five) minutes x 3 doses as needed for chest pain. Patient not taking: Reported on 08/26/2023 06/07/23   Amon Aloysius BRAVO, MD  potassium chloride  (KLOR-CON  M) 10 MEQ tablet Take 1 tablet (10 mEq total) by mouth daily. 02/06/24   Amon Aloysius BRAVO, MD  terazosin  (HYTRIN ) 1 MG capsule Take 1 capsule (1 mg total) by mouth at bedtime. 02/06/24   Paz, Jose E, MD    Allergies: Ace inhibitors, Amlodipine besylate, Metformin  and related, and Porcine (pork) protein-containing drug products    Review of Systems  Constitutional:  Positive for fatigue.  Cardiovascular:  Positive for leg swelling.  Neurological:  Positive for weakness and light-headedness.  Psychiatric/Behavioral:  The patient is nervous/anxious.   All other systems reviewed and are negative.   Updated Vital Signs BP (!) 175/88   Pulse 73   Temp 97.8 F (36.6 C) (Oral)   Resp 14   SpO2 99%   Physical Exam Vitals and nursing note reviewed.  Constitutional:      Appearance: Normal appearance.  HENT:     Head: Normocephalic and atraumatic.     Nose:  Nose normal.  Eyes:     Extraocular Movements: Extraocular movements intact.     Conjunctiva/sclera: Conjunctivae normal.     Pupils: Pupils are equal, round, and reactive to light.  Cardiovascular:     Rate and Rhythm: Normal rate.     Pulses: Normal pulses.     Heart sounds: Normal heart sounds.     Comments: Mild pitting edema in the ankle and feet bilaterally. Pulmonary:     Effort: Pulmonary effort is normal. No tachypnea or respiratory distress.     Breath sounds: Normal breath sounds.     Comments: No noted rales or wheeze on auscultation. Musculoskeletal:        General: Normal range of motion.     Cervical  back: Normal range of motion.     Right lower leg: 1+ Pitting Edema present.     Left lower leg: 1+ Pitting Edema present.     Right ankle: Swelling present.     Left ankle: Swelling present.     Right foot: Swelling present.     Left foot: Swelling present.     Comments: Mild pitting edema in the feet and ankles bilaterally.  No decreased range of motion, sensation, deformity, or pain with ambulation or movement.  Neurological:     General: No focal deficit present.     Mental Status: He is alert.  Psychiatric:        Mood and Affect: Mood normal.        Behavior: Behavior normal.     (all labs ordered are listed, but only abnormal results are displayed) Labs Reviewed  COMPREHENSIVE METABOLIC PANEL WITH GFR - Abnormal; Notable for the following components:      Result Value   Glucose, Bld 105 (*)    Creatinine, Ser 1.27 (*)    Total Bilirubin 1.3 (*)    GFR, Estimated 54 (*)    All other components within normal limits  URINALYSIS, ROUTINE W REFLEX MICROSCOPIC - Abnormal; Notable for the following components:   Color, Urine STRAW (*)    All other components within normal limits  BRAIN NATRIURETIC PEPTIDE - Abnormal; Notable for the following components:   B Natriuretic Peptide 133.0 (*)    All other components within normal limits  CBG MONITORING, ED - Abnormal; Notable for the following components:   Glucose-Capillary 104 (*)    All other components within normal limits  CBC  TROPONIN I (HIGH SENSITIVITY)    EKG: EKG Interpretation Date/Time:  Friday June 01 2024 10:47:47 EDT Ventricular Rate:  74 PR Interval:  202 QRS Duration:  134 QT Interval:  392 QTC Calculation: 435 R Axis:   -13  Text Interpretation: AV dual-paced rhythm Abnormal ECG When compared with ECG of 10-May-2023 10:37, PREVIOUS ECG IS PRESENT Baseline wander No significant change since last tracing Confirmed by Rogelia Satterfield ([id number]) on 06/01/2024 1:20:31 PM  Radiology: DG Chest Portable 1  View Result Date: 06/01/2024 EXAM: 1 VIEW(S) XRAY OF THE CHEST 06/01/2024 01:08:56 PM COMPARISON: None available. CLINICAL HISTORY: SHOB FINDINGS: LINES, TUBES AND DEVICES: Left chest dual-chamber pacemaker with leads projecting over right atrium and ventricle. LUNGS AND PLEURA: Low lung volumes accentuate cardiomediastinal silhouette and pulmonary vascularity. No focal pulmonary opacity. No pulmonary edema. No pleural effusion. No pneumothorax. HEART AND MEDIASTINUM: Cardiomegaly. Aortic atherosclerotic calcification. The cardiomediastinal silhouette is accentuated, likely due to low lung volumes. BONES AND SOFT TISSUES: No acute osseous abnormality. IMPRESSION: 1. Cardiomegaly. 2. Low lung volumes that accentuate the cardiomediastinal silhouette  and pulmonary vascularity. Electronically signed by: Norleen Boxer MD 06/01/2024 01:42 PM EDT RP Workstation: [id number]     Procedures   Medications Ordered in the ED  furosemide (LASIX) tablet 20 mg (20 mg Oral Given 06/01/24 1621)    89 y.o. male presents to the ED with complaints of leg heaviness, weakness, lightheadedness, depressed mood for several weeks., this involves an extensive number of treatment options, and is a complaint that carries with it a high risk of complications and morbidity.  The differential diagnosis includes CHF exacerbation, ACS, dependent edema, venous insuffiencey  (Ddx)  On arrival pt is nontoxic, vitals significant for mildly elevated blood pressure no other significant vitals noted. Exam significant for mild pitting edema in the feet and ankles bilaterally.  Lungs clear to auscultation and no signs of respiratory distress.  Patient also denies any orthopnea  Additional history obtained from chart review significant for pacemaker placement cardiology for AV block.  I ordered medication Lasix for edema.  Patient has received Lasix in the past with resolution of symptoms.  Lab Tests:  I Ordered, reviewed, and interpreted  labs, which included: CMP, CBC, UA, troponin, BNP.  CMP significant for elevated creatinine and decreased GFR from previous labs this is near patient's baseline.  CBC and UA unremarkable.  BNP mildly elevated at 133.  Troponin was negative.  Imaging Studies ordered:  I ordered imaging studies which included chest x-ray, I independently visualized and interpreted imaging which showed significant for cardiomegaly  ED Course:   Patient sitting comfortably in ED bed in no acute distress nontoxic-appearing.  On initial exam patient is standing in the ED room walking around anxiously.  Patient reports he has been anxious over the last couple weeks.  Patient does not endorse any shortness of breath chest pain or shortness of breath on exertion.  Family at bedside advised the patient has his appointment on Monday with PCP. Lasix was prescribed from the ED at prior visit on the 13th with resolution of symptoms.   There is cardiomegaly noted on the chest x-ray patient also has has mild pitting edema in bilateral feet and ankles.  BNP was mildly elevated at 133.  On reevaluation patient sitting comfortably in ED bed in no acute distress reporting no new symptoms including no shortness of breath or chest pain.  Patient was advised of findings and advised to discuss findings with primary care on Monday.  Patient already has established appointment with them.  Patient was given strict return precautions and reports feeling comfortable discharge.   Portions of this note were generated with Scientist, clinical (histocompatibility and immunogenetics). Dictation errors may occur despite best attempts at proofreading.   Final diagnoses:  Leg edema    ED Discharge Orders          Ordered    furosemide (LASIX) 20 MG tablet  Daily        06/01/24 1616               Myriam Fonda RAMAN, PA-C 06/01/24 1716    Rogelia Jerilynn RAMAN, MD 06/03/24 1649

## 2024-06-01 NOTE — ED Triage Notes (Signed)
 PT BIB [facility] for BL lefg pain and new Gen weakness beginning last night.  Feels weak and dizzy. Hx of HTN   200/110 HR 80, 97% CBG 134

## 2024-06-04 ENCOUNTER — Ambulatory Visit: Admitting: Family Medicine

## 2024-06-04 ENCOUNTER — Encounter: Payer: Self-pay | Admitting: Family Medicine

## 2024-06-04 VITALS — BP 138/80 | HR 97 | Temp 98.0°F | Resp 16 | Ht 67.0 in | Wt 194.4 lb

## 2024-06-04 DIAGNOSIS — R600 Localized edema: Secondary | ICD-10-CM

## 2024-06-04 DIAGNOSIS — F411 Generalized anxiety disorder: Secondary | ICD-10-CM

## 2024-06-04 MED ORDER — SERTRALINE HCL 25 MG PO TABS: 25.0000 mg | ORAL_TABLET | Freq: Every day | ORAL | 3 refills | Status: AC

## 2024-06-04 MED ORDER — POTASSIUM CHLORIDE CRYS ER 10 MEQ PO TBCR: 20.0000 meq | EXTENDED_RELEASE_TABLET | Freq: Every day | ORAL | Status: DC

## 2024-06-04 MED ORDER — FUROSEMIDE 40 MG PO TABS: 40.0000 mg | ORAL_TABLET | Freq: Every day | ORAL | 3 refills | Status: AC

## 2024-06-04 NOTE — Patient Instructions (Signed)
 Someone will reach out regarding your scan.   Aim to do some physical exertion for 150 minutes per week. This is typically divided into 5 days per week, 30 minutes per day. The activity should be enough to get your heart rate up. Anything is better than nothing if you have time constraints.  Please consider counseling. Contact [phone] to schedule an appointment or inquire about cost/insurance coverage.  [facility] located at [address], [place], [place].  Phone number = [phone].  Dr. Verdel Silk - Adult Psychiatry.    [facility] located at [address] [place], [place], [place]. Phone number = [phone].   [facility] located at [address], [place], [place].  Phone number = [phone].   [facility] located at [address] [place], [place], [place].  Phone number = [phone].  Let us  know if you need anything.

## 2024-06-04 NOTE — Progress Notes (Signed)
 Chief Complaint  Patient presents with   Leg Swelling    Leg Swelling    Maurice Colon here for bilateral leg swelling. Here w family member who helps interpret (Spanish).   Duration: worsening over past few mo; going on for several years Hx of prolonged bedrest, recent surgery, travel or injury? No Pain the calf? No SOB? No Personal or family history of clot or bleeding disorder? No Hx of heart failure, renal failure, hepatic failure? No He was rx'd Lasxi 20 mg/d. No AE's, compliant, reports improvement in swelling.   Patient has been having anxiety over the past couple months.  He will lay down and start having racing thoughts and rapid heartbeat.  No obvious trigger for this.  He has not seen a therapist or counselor.  No homicidal or suicidal activity.  No family history of anxiety.  He has never taken anything for this before.  No self-medication.  Past Medical History:  Diagnosis Date   Bradycardia    DJD (degenerative joint disease)    Hyperlipidemia    Hypertriglyceridemia   Hypertension    Hypogonadism, male    Osteoporosis    Prediabetes 07/16/2013   Rosacea    Family History  Problem Relation Age of Onset   Diabetes Mother    Hypertension Mother    Heart attack Father        age 75   Cancer Neg Hx    Past Surgical History:  Procedure Laterality Date   CATARACT EXTRACTION     Bilaterally   INGUINAL HERNIA REPAIR     PACEMAKER IMPLANT N/A 02/01/2023   Procedure: PACEMAKER IMPLANT;  Surgeon: Maurice Eulas BRAVO, MD;  Location: [facility];  Service: Cardiovascular;  Laterality: N/A;    Current Outpatient Medications:    furosemide (LASIX) 40 MG tablet, Take 1 tablet (40 mg total) by mouth daily., Disp: 30 tablet, Rfl: 3   sertraline (ZOLOFT) 25 MG tablet, Take 1 tablet (25 mg total) by mouth daily., Disp: 30 tablet, Rfl: 3   acetaminophen  (TYLENOL ) 500 MG tablet, Take 500 mg by mouth every 6 (six) hours as needed for moderate pain. Takes 1 or  2 as needed, Disp: , Rfl:    aspirin EC 81 MG tablet, Take 81 mg by mouth daily. Swallow whole., Disp: , Rfl:    atorvastatin  (LIPITOR) 10 MG tablet, Take 1 tablet (10 mg total) by mouth daily., Disp: 90 tablet, Rfl: 1   augmented betamethasone  dipropionate (DIPROLENE -AF) 0.05 % cream, Apply topically 2 (two) times daily as needed., Disp: 60 g, Rfl: 3   diphenhydrAMINE (BENADRYL) 25 mg capsule, Take 25 mg by mouth at bedtime as needed for sleep., Disp: , Rfl:    losartan -hydrochlorothiazide  (HYZAAR) 100-25 MG tablet, TAKE 1 TABLET EVERY DAY, Disp: 90 tablet, Rfl: 3   meloxicam  (MOBIC ) 7.5 MG tablet, Take 1-2 tablets (7.5-15 mg total) by mouth daily as needed for pain., Disp: 180 tablet, Rfl: 3   nitroGLYCERIN  (NITROSTAT ) 0.4 MG SL tablet, Place 1 tablet (0.4 mg total) under the tongue every 5 (five) minutes x 3 doses as needed for chest pain. (Patient not taking: Reported on 08/26/2023), Disp: 25 tablet, Rfl: 3   potassium chloride  (KLOR-CON  M) 10 MEQ tablet, Take 2 tablets (20 mEq total) by mouth daily., Disp: , Rfl:    terazosin  (HYTRIN ) 1 MG capsule, Take 1 capsule (1 mg total) by mouth at bedtime., Disp: 90 capsule, Rfl: 1  BP 138/80 (BP Location: Left Arm, Patient Position: Sitting)  Pulse 97   Temp 98 F (36.7 C) (Oral)   Resp 16   Ht 5' 7 (1.702 m)   Wt 194 lb 6.4 oz (88.2 kg)   SpO2 95%   BMI 30.45 kg/m  Gen- awake, alert, appears stated age Heart- RRR, 2+ pitting bilateral LE edema tapering at the distal third of the tibia Lungs- CTAB, normal effort w/o accessory muscle use MSK-no calf TTP bilaterally Psych: Age appropriate judgment and insight  Bilateral leg edema - Plan: furosemide (LASIX) 40 MG tablet, ECHOCARDIOGRAM COMPLETE  GAD (generalized anxiety disorder)  Chronic, not currently controlled.  Worsening over the past few months.  Will check a repeat echo given his heart history.  Blood work in the ER was unremarkable.  He does report some improvement with the Lasix but  it takes 60 minutes to urinate after taking, we will increase the dosage.  He will double his potassium from 10 mEq daily to 20 mEq daily.  He is to follow-up with Dr. Amon next week and can probably recheck a BMP then. Chronic, not controlled.  Start Zoloft 25 mg daily.  Counseling information provided.  Counseled on exercise.  Follow-up with regular PCP in around 6 weeks. Pt and his fam member voiced understanding and agreement to the plan.  Maurice [place], DO 06/04/24  4:23 PM

## 2024-06-05 ENCOUNTER — Other Ambulatory Visit: Payer: Self-pay

## 2024-06-05 MED ORDER — LOSARTAN POTASSIUM-HCTZ 100-25 MG PO TABS: 1.0000 | ORAL_TABLET | Freq: Every day | ORAL | 1 refills | Status: AC

## 2024-06-11 ENCOUNTER — Encounter: Admitting: Internal Medicine

## 2024-06-11 NOTE — Progress Notes (Signed)
 [facility] [address], [address] [place], [place] [address] [phone]  Date:  06/13/2024   Name:  Maurice Colon   DOB:  19-Aug-1932   MRN:  [id number]  PCP:  Amon Aloysius BRAVO, MD    Chief Complaint: Hospitalization Follow-up (I'm still having a lot of pain and swelling in legs /I can't sleep, I lay down but can't sleep )   History of Present Illness:  Maurice Colon is a 89 y.o. 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 Frann in the office on 11/3 It looks like they increased his dose of furosemide  and also added some additional potassium Most recent BMP 10/31, minimally reduced GFR 54, potassium normal  Lab Results  Component Value Date   HGBA1C 5.9 06/13/2024     Discussed the use of AI scribe software for clinical note transcription with the patient, who gave verbal consent to proceed.  History of Present Illness Maurice Colon is an 89 year old male who presents with bilateral leg numbness and swelling.  He has experienced swelling in both legs for over a year, which worsens in the afternoon. He has visited the ER a couple of times for this issue and is currently taking furosemide  once daily to manage the swelling.  The swelling is actually better, it sound like his issue now is more neuropathic pain  He describes a numbness sensation in both legs, particularly bothersome when walking. There is no significant pain, but he notes a 'weird'  feeling in his legs, along with tingling and a sensation akin to walking on stones. This numbness has persisted for over a year.  He had a pacemaker placed last summer and received IV fluids around the same time due to dizziness and dehydration. He reports feeling similar symptoms now as he did then.  He does not have diabetes but mentions slightly elevated blood sugar levels. He experiences difficulty sleeping, often taking two Tylenol  PM tablets to aid sleep. His mind races when trying to sleep, contributing to his insomnia.   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   Elevated LFTs 09/16/2015   PCP NOTES>>>>>>>>>>>>>>>>>>>>>>>>>>>>>>>>>>>>> 04/16/2015   Diabetes mellitus, type II (HCC) 07/16/2013   Back pain w/ radiculopathy 11/29/2012   Annual physical exam 11/02/2011   Rosacea 02/11/2011   DJD (degenerative joint disease) 02/11/2011   HYPOGONADISM, MALE 06/13/2008   Osteoporosis 03/12/2008   Hyperlipidemia 02/13/2007   Essential hypertension 02/13/2007    Past Medical History:  Diagnosis Date   Bradycardia    DJD (degenerative joint disease)    Hyperlipidemia    Hypertriglyceridemia   Hypertension    Hypogonadism, male    Osteoporosis    Prediabetes 07/16/2013   Rosacea     Past Surgical History:  Procedure Laterality Date   CATARACT EXTRACTION     Bilaterally   INGUINAL  HERNIA REPAIR     PACEMAKER IMPLANT N/A 02/01/2023   Procedure: PACEMAKER IMPLANT;  Surgeon: Nancey Eulas BRAVO, MD;  Location: [facility];  Service: Cardiovascular;  Laterality: N/A;    Social History   Tobacco Use   Smoking status: Never   Smokeless tobacco: Never  Substance Use Topics   Alcohol use: No    Comment: wine, occasionally    Family History  Problem Relation Age of Onset   Diabetes Mother    Hypertension Mother    Heart attack Father        age 89   Cancer Neg Hx     Allergies   Allergen Reactions   Ace Inhibitors     REACTION: cough   Amlodipine Besylate     REACTION: edema   Metformin  And Related Nausea Only    Intolerant, no allergic reaction, see OV 08-30-17   Porcine (Pork) Protein-Containing Drug Products     Due to culture/religion, does not eat pork    Medication list has been reviewed and updated.  Current Outpatient Medications on File Prior to Visit  Medication Sig Dispense Refill   acetaminophen  (TYLENOL ) 500 MG tablet Take 500 mg by mouth every 6 (six) hours as needed for moderate pain. Takes 1 or 2 as needed     aspirin EC 81 MG tablet Take 81 mg by mouth daily. Swallow whole.     atorvastatin  (LIPITOR) 10 MG tablet Take 1 tablet (10 mg total) by mouth daily. 90 tablet 1   augmented betamethasone  dipropionate (DIPROLENE -AF) 0.05 % cream Apply topically 2 (two) times daily as needed. 60 g 3   diphenhydrAMINE (BENADRYL) 25 mg capsule Take 25 mg by mouth at bedtime as needed for sleep.     furosemide  (LASIX ) 40 MG tablet Take 1 tablet (40 mg total) by mouth daily. 30 tablet 3   losartan -hydrochlorothiazide  (HYZAAR) 100-25 MG tablet Take 1 tablet by mouth daily. 90 tablet 1   meloxicam  (MOBIC ) 7.5 MG tablet Take 1-2 tablets (7.5-15 mg total) by mouth daily as needed for pain. 180 tablet 3   nitroGLYCERIN  (NITROSTAT ) 0.4 MG SL tablet Place 1 tablet (0.4 mg total) under the tongue every 5 (five) minutes x 3 doses as needed for chest pain. 25 tablet 3   potassium chloride  (KLOR-CON  M) 10 MEQ tablet Take 2 tablets (20 mEq total) by mouth daily.     sertraline  (ZOLOFT ) 25 MG tablet Take 1 tablet (25 mg total) by mouth daily. 30 tablet 3   terazosin  (HYTRIN ) 1 MG capsule Take 1 capsule (1 mg total) by mouth at bedtime. 90 capsule 1   No current facility-administered medications on file prior to visit.    Review of Systems:  As per HPI- otherwise negative.   Physical Examination: Vitals:   06/13/24 0944 06/13/24 2054  BP: (!) 152/78 (!) 145/80   Pulse: 77   SpO2: 95%    Vitals:   06/13/24 0944  Weight: 194 lb 12.8 oz (88.4 kg)  Height: 5' 7 (1.702 m)   Body mass index is 30.51 kg/m. Ideal Body Weight: Weight in (lb) to have BMI = 25: 159.3  GEN: no acute distress.  Looks well and younger than age.  Mildly obese HEENT: Atraumatic, Normocephalic.  Ears and Nose: No external deformity. CV: RRR, No M/G/R. No JVD. No thrill. No extra heart sounds. PULM: CTA B, no wheezes, crackles, rhonchi. No retractions. No resp. distress. No accessory muscle use. EXTR: No c/c/e PSYCH: Normally interactive. Conversant.  Minimal edema is present in bilateral lower extremities.  No skin breakdown of either foot.  Good pulses in both feet, stronger on the right.  Normal monofilament sensation bilaterally  BP Readings from Last 3 Encounters:  06/13/24 (!) 145/80  06/04/24 138/80  06/01/24 (!) 175/88    Assessment and Plan: Other polyneuropathy - Plan: Vitamin B12, Ferritin, Hemoglobin A1c, gabapentin  (NEURONTIN ) 100 MG capsule, TSH  Bilateral leg edema - Plan: Basic metabolic panel with GFR  Assessment & Plan Polyneuropathy Suspected neuropathy in feet with numbness and paresthesia, likely due to nerve dysfunction. Differential includes iron and B12 deficiencies. Slightly elevated blood sugar noted. Symptoms worsen at night. - Ordered blood tests for iron and B12 deficiencies. - Prescribed gabapentin , start with bedtime dosing for 3-4 days, then increase to twice daily if needed.  Localized edema of lower extremities Chronic bilateral leg swelling, managed with furosemide . Not primary concern currently. - Continue furosemide  as prescribed. Check labs today to monitor renal function, sodium and potassium  Signed Harlene Schroeder, MD  Received labs as below-he does not have MyChart so called pt but no answer- spanish speaking staff left message for him to call back  Recommend that he start on OTC B12- 1000 mcg daily  Iron level is  ok A1c shows stable blood sugars   Results for orders placed or performed in visit on 06/13/24  Vitamin B12   Collection Time: 06/13/24 10:16 AM  Result Value Ref Range   Vitamin B-12 272 211 - 911 pg/mL  Ferritin   Collection Time: 06/13/24 10:16 AM  Result Value Ref Range   Ferritin 88.4 22.0 - 322.0 ng/mL  Hemoglobin A1c   Collection Time: 06/13/24 10:16 AM  Result Value Ref Range   Hgb A1c MFr Bld 5.9 4.6 - 6.5 %  Basic metabolic panel with GFR   Collection Time: 06/13/24 10:16 AM  Result Value Ref Range   Sodium 134 (L) 135 - 145 mEq/L   Potassium 4.4 3.5 - 5.1 mEq/L   Chloride 99 96 - 112 mEq/L   CO2 27 19 - 32 mEq/L   Glucose, Bld 126 (H) 70 - 99 mg/dL   BUN 20 6 - 23 mg/dL   Creatinine, Ser 8.77 0.40 - 1.50 mg/dL   GFR 47.28 (L) >39.99 mL/min   Calcium  9.0 8.4 - 10.5 mg/dL  TSH   Collection Time: 06/13/24 10:16 AM  Result Value Ref Range   TSH 1.97 0.35 - 5.50 uIU/mL      "

## 2024-06-13 ENCOUNTER — Ambulatory Visit: Admitting: Family Medicine

## 2024-06-13 ENCOUNTER — Encounter: Payer: Self-pay | Admitting: Family Medicine

## 2024-06-13 VITALS — BP 145/80 | HR 77 | Ht 67.0 in | Wt 194.8 lb

## 2024-06-13 DIAGNOSIS — R600 Localized edema: Secondary | ICD-10-CM | POA: Diagnosis not present

## 2024-06-13 DIAGNOSIS — G6289 Other specified polyneuropathies: Secondary | ICD-10-CM

## 2024-06-13 LAB — BASIC METABOLIC PANEL WITH GFR
BUN: 20 mg/dL (ref 6–23)
CO2: 27 meq/L (ref 19–32)
Calcium: 9 mg/dL (ref 8.4–10.5)
Chloride: 99 meq/L (ref 96–112)
Creatinine, Ser: 1.22 mg/dL (ref 0.40–1.50)
GFR: 52.71 mL/min — ABNORMAL LOW (ref >60.00)
Glucose, Bld: 126 mg/dL — ABNORMAL HIGH (ref 70–99)
Potassium: 4.4 meq/L (ref 3.5–5.1)
Sodium: 134 meq/L — ABNORMAL LOW (ref 135–145)

## 2024-06-13 LAB — TSH: TSH: 1.97 u[IU]/mL (ref 0.35–5.50)

## 2024-06-13 LAB — VITAMIN B12: Vitamin B-12: 272 pg/mL (ref 211–911)

## 2024-06-13 LAB — HEMOGLOBIN A1C: Hgb A1c MFr Bld: 5.9 % (ref 4.6–6.5)

## 2024-06-13 LAB — FERRITIN: Ferritin: 88.4 ng/mL (ref 22.0–322.0)

## 2024-06-13 MED ORDER — GABAPENTIN 100 MG PO CAPS
ORAL_CAPSULE | ORAL | 3 refills | Status: AC
Start: 2024-06-13 — End: ?

## 2024-06-27 ENCOUNTER — Ambulatory Visit: Payer: Self-pay | Admitting: Family Medicine

## 2024-06-27 ENCOUNTER — Ambulatory Visit (HOSPITAL_COMMUNITY)
Admission: RE | Admit: 2024-06-27 | Discharge: 2024-06-27 | Disposition: A | Discharge status: Home / Self Care | Source: Ambulatory Visit | Attending: Family Medicine | Admitting: Family Medicine

## 2024-06-27 DIAGNOSIS — R0600 Dyspnea, unspecified: Secondary | ICD-10-CM | POA: Diagnosis not present

## 2024-06-27 DIAGNOSIS — R600 Localized edema: Secondary | ICD-10-CM | POA: Diagnosis not present

## 2024-06-27 DIAGNOSIS — I517 Cardiomegaly: Secondary | ICD-10-CM | POA: Insufficient documentation

## 2024-06-27 DIAGNOSIS — I080 Rheumatic disorders of both mitral and aortic valves: Secondary | ICD-10-CM | POA: Diagnosis not present

## 2024-06-27 LAB — ECHOCARDIOGRAM COMPLETE
AR max vel: 2.16 cm2
AV Area VTI: 2.48 cm2
AV Area mean vel: 2.4 cm2
AV Mean grad: 6 mmHg
AV Peak grad: 11 mmHg
Ao pk vel: 1.66 m/s
Area-P 1/2: 4.49 cm2
Est EF: 50
S' Lateral: 3.9 cm

## 2024-07-04 ENCOUNTER — Telehealth: Payer: Self-pay | Admitting: Internal Medicine

## 2024-07-04 NOTE — Telephone Encounter (Signed)
 Copied from CRM [id number]. Topic: Appointments - Scheduling Inquiry for Clinic >> Jul 04, 2024 12:22 PM Ashley R wrote: Reason for CRM: Pt. Spouse states he received a text message from [place] in the office stating March appt needs to be moved up to January. Could not find record of call/text, but willing to be rescheduled if needed. Callback [phone]

## 2024-07-04 NOTE — Telephone Encounter (Signed)
 Copied from CRM #[id number]. Topic: Clinical - Lab/Test Results >> Jul 03, 2024  5:26 PM Lauren C wrote: Reason for CRM: Pt returning call from office from [place], [place], CMA to go over imaging results. Requesting call back [phone]

## 2024-07-04 NOTE — Telephone Encounter (Signed)
Called a few times but no answer

## 2024-07-05 ENCOUNTER — Other Ambulatory Visit: Payer: Self-pay | Admitting: Internal Medicine

## 2024-07-05 NOTE — Telephone Encounter (Signed)
 Appt scheduled

## 2024-07-28 ENCOUNTER — Other Ambulatory Visit: Payer: Self-pay | Admitting: Internal Medicine

## 2024-07-31 ENCOUNTER — Ambulatory Visit (INDEPENDENT_AMBULATORY_CARE_PROVIDER_SITE_OTHER): Payer: Medicare HMO

## 2024-07-31 DIAGNOSIS — I441 Atrioventricular block, second degree: Secondary | ICD-10-CM

## 2024-07-31 LAB — CUP PACEART REMOTE DEVICE CHECK
Battery Remaining Longevity: 69 mo
Battery Remaining Percentage: 80 %
Battery Voltage: 2.98 V
Brady Statistic AP VP Percent: 91 %
Brady Statistic AP VS Percent: 1 %
Brady Statistic AS VP Percent: 4.6 %
Brady Statistic AS VS Percent: 1 %
Brady Statistic RA Percent Paced: 87 %
Brady Statistic RV Percent Paced: 95 %
Date Time Interrogation Session: 20251230020016
Implantable Lead Connection Status: 753985
Implantable Lead Connection Status: 753985
Implantable Lead Implant Date: 20240702
Implantable Lead Implant Date: 20240702
Implantable Lead Location: 753859
Implantable Lead Location: 753860
Implantable Pulse Generator Implant Date: 20240702
Lead Channel Impedance Value: 380 Ohm
Lead Channel Impedance Value: 390 Ohm
Lead Channel Pacing Threshold Amplitude: 0.75 V
Lead Channel Pacing Threshold Amplitude: 0.75 V
Lead Channel Pacing Threshold Pulse Width: 0.5 ms
Lead Channel Pacing Threshold Pulse Width: 0.5 ms
Lead Channel Sensing Intrinsic Amplitude: 0.6 mV
Lead Channel Sensing Intrinsic Amplitude: 12 mV
Lead Channel Setting Pacing Amplitude: 2 V
Lead Channel Setting Pacing Amplitude: 2.5 V
Lead Channel Setting Pacing Pulse Width: 0.5 ms
Lead Channel Setting Sensing Sensitivity: 2 mV
Pulse Gen Model: 2272
Pulse Gen Serial Number: 5829960

## 2024-08-01 ENCOUNTER — Ambulatory Visit: Payer: Self-pay | Admitting: Cardiovascular Disease

## 2024-08-01 NOTE — Progress Notes (Signed)
 Remote PPM Transmission

## 2024-09-19 ENCOUNTER — Ambulatory Visit: Admitting: Internal Medicine

## 2024-10-08 ENCOUNTER — Ambulatory Visit: Admitting: Internal Medicine
